# Patient Record
Sex: Female | Born: 1970 | Race: Black or African American | Hispanic: No | Smoking: Never smoker
Health system: Southern US, Community
[De-identification: ages and names within clinical notes are randomized; demographics above are authoritative.]

## PROBLEM LIST (undated history)

## (undated) DIAGNOSIS — Z21 Asymptomatic human immunodeficiency virus [HIV] infection status: Secondary | ICD-10-CM

## (undated) DIAGNOSIS — I10 Essential (primary) hypertension: Secondary | ICD-10-CM

## (undated) DIAGNOSIS — D649 Anemia, unspecified: Secondary | ICD-10-CM

## (undated) DIAGNOSIS — E785 Hyperlipidemia, unspecified: Secondary | ICD-10-CM

## (undated) DIAGNOSIS — G629 Polyneuropathy, unspecified: Secondary | ICD-10-CM

## (undated) DIAGNOSIS — B20 Human immunodeficiency virus [HIV] disease: Secondary | ICD-10-CM

## (undated) HISTORY — PX: HERNIA REPAIR: SHX51

## (undated) HISTORY — DX: Human immunodeficiency virus (HIV) disease: B20

## (undated) HISTORY — DX: Anemia, unspecified: D64.9

## (undated) HISTORY — DX: Polyneuropathy, unspecified: G62.9

## (undated) HISTORY — DX: Hyperlipidemia, unspecified: E78.5

## (undated) HISTORY — DX: Asymptomatic human immunodeficiency virus (hiv) infection status: Z21

---

## 1998-04-05 ENCOUNTER — Emergency Department (HOSPITAL_COMMUNITY): Admission: EM | Admit: 1998-04-05 | Discharge: 1998-04-05 | Payer: Self-pay | Admitting: Emergency Medicine

## 1998-04-22 ENCOUNTER — Emergency Department (HOSPITAL_COMMUNITY): Admission: EM | Admit: 1998-04-22 | Discharge: 1998-04-22 | Payer: Self-pay | Admitting: Emergency Medicine

## 1998-05-17 ENCOUNTER — Ambulatory Visit (HOSPITAL_COMMUNITY): Admission: RE | Admit: 1998-05-17 | Discharge: 1998-05-17 | Payer: Self-pay | Admitting: *Deleted

## 1998-05-17 ENCOUNTER — Other Ambulatory Visit: Admission: RE | Admit: 1998-05-17 | Discharge: 1998-05-17 | Payer: Self-pay | Admitting: *Deleted

## 1998-06-21 ENCOUNTER — Inpatient Hospital Stay (HOSPITAL_COMMUNITY): Admission: AD | Admit: 1998-06-21 | Discharge: 1998-06-21 | Payer: Self-pay | Admitting: *Deleted

## 1998-07-07 ENCOUNTER — Ambulatory Visit (HOSPITAL_COMMUNITY): Admission: RE | Admit: 1998-07-07 | Discharge: 1998-07-07 | Payer: Self-pay | Admitting: *Deleted

## 1998-08-27 ENCOUNTER — Emergency Department (HOSPITAL_COMMUNITY): Admission: EM | Admit: 1998-08-27 | Discharge: 1998-08-27 | Payer: Self-pay | Admitting: Emergency Medicine

## 1998-08-27 ENCOUNTER — Encounter: Payer: Self-pay | Admitting: Emergency Medicine

## 1998-08-27 ENCOUNTER — Inpatient Hospital Stay (HOSPITAL_COMMUNITY): Admission: AD | Admit: 1998-08-27 | Discharge: 1998-09-07 | Payer: Self-pay | Admitting: Obstetrics

## 1998-09-01 ENCOUNTER — Encounter: Payer: Self-pay | Admitting: Pulmonary Disease

## 1998-09-03 ENCOUNTER — Encounter: Payer: Self-pay | Admitting: Pulmonary Disease

## 1998-09-04 ENCOUNTER — Encounter: Payer: Self-pay | Admitting: Pulmonary Disease

## 1998-09-05 ENCOUNTER — Encounter: Payer: Self-pay | Admitting: Pulmonary Disease

## 1998-10-28 ENCOUNTER — Inpatient Hospital Stay (HOSPITAL_COMMUNITY): Admission: AD | Admit: 1998-10-28 | Discharge: 1998-10-30 | Payer: Self-pay | Admitting: *Deleted

## 1999-02-25 ENCOUNTER — Emergency Department (HOSPITAL_COMMUNITY): Admission: EM | Admit: 1999-02-25 | Discharge: 1999-02-25 | Payer: Self-pay | Admitting: Emergency Medicine

## 1999-02-26 ENCOUNTER — Encounter: Payer: Self-pay | Admitting: Emergency Medicine

## 1999-04-06 ENCOUNTER — Emergency Department (HOSPITAL_COMMUNITY): Admission: EM | Admit: 1999-04-06 | Discharge: 1999-04-06 | Payer: Self-pay | Admitting: Emergency Medicine

## 2000-08-23 ENCOUNTER — Ambulatory Visit (HOSPITAL_COMMUNITY): Admission: RE | Admit: 2000-08-23 | Discharge: 2000-08-23 | Payer: Self-pay | Admitting: *Deleted

## 2000-08-23 ENCOUNTER — Encounter: Payer: Self-pay | Admitting: *Deleted

## 2001-02-04 ENCOUNTER — Encounter (INDEPENDENT_AMBULATORY_CARE_PROVIDER_SITE_OTHER): Payer: Self-pay

## 2001-02-04 ENCOUNTER — Inpatient Hospital Stay (HOSPITAL_COMMUNITY): Admission: AD | Admit: 2001-02-04 | Discharge: 2001-02-07 | Payer: Self-pay | Admitting: *Deleted

## 2001-05-08 ENCOUNTER — Emergency Department (HOSPITAL_COMMUNITY): Admission: EM | Admit: 2001-05-08 | Discharge: 2001-05-08 | Payer: Self-pay | Admitting: *Deleted

## 2001-05-08 ENCOUNTER — Encounter: Payer: Self-pay | Admitting: Emergency Medicine

## 2002-04-16 ENCOUNTER — Other Ambulatory Visit: Admission: RE | Admit: 2002-04-16 | Discharge: 2002-04-16 | Payer: Self-pay | Admitting: Obstetrics and Gynecology

## 2002-09-16 ENCOUNTER — Encounter: Admission: RE | Admit: 2002-09-16 | Discharge: 2002-09-16 | Payer: Self-pay | Admitting: Sports Medicine

## 2002-09-16 ENCOUNTER — Encounter: Payer: Self-pay | Admitting: Sports Medicine

## 2002-09-28 ENCOUNTER — Emergency Department (HOSPITAL_COMMUNITY): Admission: EM | Admit: 2002-09-28 | Discharge: 2002-09-28 | Payer: Self-pay | Admitting: Emergency Medicine

## 2003-09-05 ENCOUNTER — Emergency Department (HOSPITAL_COMMUNITY): Admission: EM | Admit: 2003-09-05 | Discharge: 2003-09-05 | Payer: Self-pay | Admitting: Emergency Medicine

## 2003-12-16 ENCOUNTER — Emergency Department (HOSPITAL_COMMUNITY): Admission: EM | Admit: 2003-12-16 | Discharge: 2003-12-17 | Payer: Self-pay | Admitting: Emergency Medicine

## 2004-10-18 ENCOUNTER — Emergency Department (HOSPITAL_COMMUNITY): Admission: EM | Admit: 2004-10-18 | Discharge: 2004-10-18 | Payer: Self-pay | Admitting: Emergency Medicine

## 2006-02-27 ENCOUNTER — Other Ambulatory Visit: Admission: RE | Admit: 2006-02-27 | Discharge: 2006-02-27 | Payer: Self-pay | Admitting: Obstetrics and Gynecology

## 2006-06-07 ENCOUNTER — Emergency Department (HOSPITAL_COMMUNITY): Admission: EM | Admit: 2006-06-07 | Discharge: 2006-06-07 | Payer: Self-pay | Admitting: Family Medicine

## 2008-02-16 ENCOUNTER — Emergency Department (HOSPITAL_COMMUNITY): Admission: EM | Admit: 2008-02-16 | Discharge: 2008-02-16 | Payer: Self-pay | Admitting: Emergency Medicine

## 2008-09-28 ENCOUNTER — Emergency Department (HOSPITAL_COMMUNITY): Admission: EM | Admit: 2008-09-28 | Discharge: 2008-09-28 | Payer: Self-pay | Admitting: Emergency Medicine

## 2009-03-29 ENCOUNTER — Emergency Department (HOSPITAL_COMMUNITY): Admission: EM | Admit: 2009-03-29 | Discharge: 2009-03-29 | Payer: Self-pay | Admitting: Emergency Medicine

## 2009-04-05 ENCOUNTER — Emergency Department (HOSPITAL_COMMUNITY): Admission: EM | Admit: 2009-04-05 | Discharge: 2009-04-05 | Payer: Self-pay | Admitting: Emergency Medicine

## 2009-06-27 ENCOUNTER — Emergency Department (HOSPITAL_COMMUNITY): Admission: EM | Admit: 2009-06-27 | Discharge: 2009-06-27 | Payer: Self-pay | Admitting: Emergency Medicine

## 2010-04-30 ENCOUNTER — Emergency Department (HOSPITAL_COMMUNITY): Admission: EM | Admit: 2010-04-30 | Discharge: 2010-04-30 | Payer: Self-pay | Admitting: Emergency Medicine

## 2011-01-30 LAB — WET PREP, GENITAL
Trich, Wet Prep: NONE SEEN
Yeast Wet Prep HPF POC: NONE SEEN

## 2011-03-10 NOTE — H&P (Signed)
[facility]  Patient:    Krystal Bowen, Krystal Bowen                       MRN: [id number] Adm. Date:  [id number] Disc. Date: [id number] Attending:  Deniece Ree                         History and Physical  HISTORY:                      Patient is a 40 year old gravida 5 para 4 abortus 1 who was admitted and had a spontaneous vaginal delivery without any difficulty.  Patient was scheduled for a bilateral tubal ligation.  Patient understands that there are different types of contraceptives available, however, is very adamant about having permanent sterilization.  She understands that this procedure is intended to be permanent; however, cannot be guaranteed.  PAST MEDICAL HISTORY, FAMILY HISTORY, REVIEW OF SYSTEMS:   Noncontributory.  PHYSICAL EXAMINATION:  GENERAL:                      Revealed a well-developed, well-nourished female in no acute distress.  HEENT:                        Within normal limits.  NECK:                         Supple.  BREASTS:                      Without masses, tenderness, or discharge.  LUNGS:                        Clear to percussion and auscultation.  HEART:                        Normal sinus rhythm without murmur, rub, or gallop.  ABDOMEN:                      Benign with uterine fundus extending to the umbilicus.  EXTREMITIES AND NEUROLOGIC:   Within normal limits.  PELVIC:                       Not done.  DIAGNOSIS:                    Multiparity, desirous of permanent sterilization.  PLAN:                         Bilateral tubal ligation using a modified Pomeroy procedure. DD:  02/06/01 TD:  02/07/01 Job: [id number]

## 2011-03-10 NOTE — Op Note (Signed)
[facility]  Patient:    Krystal Bowen, Krystal Bowen                       MRN: [id number] Proc. Date: 02/06/01 Adm. Date:  [id number] Disc. Date: [id number] Attending:  Deniece Ree                           Operative Report  PREOPERATIVE DIAGNOSES:       Multiparity, desires permanent sterilization.  POSTOPERATIVE DIAGNOSES:      Multiparity, desires permanent sterilization  OPERATION:                    Bilateral tubal ligation using modified Pomeroy procedure.  SURGEON:                      Deniece Ree, M.D.  ANESTHESIA:                   General.  ESTIMATED BLOOD LOSS:         Less than 25 cc.  COMPLICATIONS:                None.                                Patient tolerated the procedure well and returned to the recovery room in satisfactory condition.  PROCEDURE:                    Patient was taken to the operating room and prepped and draped in the usual fashion for postpartum sterilization.  A subumbilical incision was made.  This was carried down through the fascia and peritoneum.  Entering the abdominal cavity with some manipulation, the right tube was identified, grasped with the Babcock clamp and knuckled up, and ligated using 0 plain catgut utilizing the modified Pomeroy procedure.  This was done likewise on the opposite side.  At this point hemostasis was present. Both segments of tube were labeled and sent to pathology.  The abdominal peritoneum was then closed using 2-0 Chromic in a running stitch followed by closure of the fascia using a 4-0 Monocryl in a subcuticular stitch.  At this point the procedure was terminated.  Sponge and needle count was correct x 2. Patient tolerated the procedure well and returned to the recovery room in satisfactory condition. DD:  02/06/01 TD:  02/07/01 Job: [id number]

## 2014-04-22 ENCOUNTER — Emergency Department (HOSPITAL_COMMUNITY)
Admission: EM | Admit: 2014-04-22 | Discharge: 2014-04-22 | Disposition: A | Discharge status: Home / Self Care | Payer: Self-pay | Attending: Emergency Medicine | Admitting: Emergency Medicine

## 2014-04-22 ENCOUNTER — Encounter (HOSPITAL_COMMUNITY): Payer: Self-pay | Admitting: Emergency Medicine

## 2014-04-22 DIAGNOSIS — Z8619 Personal history of other infectious and parasitic diseases: Secondary | ICD-10-CM | POA: Insufficient documentation

## 2014-04-22 DIAGNOSIS — I10 Essential (primary) hypertension: Secondary | ICD-10-CM | POA: Insufficient documentation

## 2014-04-22 DIAGNOSIS — L240 Irritant contact dermatitis due to detergents: Secondary | ICD-10-CM | POA: Insufficient documentation

## 2014-04-22 DIAGNOSIS — L259 Unspecified contact dermatitis, unspecified cause: Secondary | ICD-10-CM

## 2014-04-22 HISTORY — DX: Essential (primary) hypertension: I10

## 2014-04-22 MED ORDER — PREDNISONE 20 MG PO TABS: 40.0000 mg | ORAL_TABLET | Freq: Every day | ORAL | Status: DC

## 2014-04-22 NOTE — ED Provider Notes (Signed)
CSN: [id number]     Arrival date & time 04/22/14  1548 History  This chart was scribed for non-physician practitioner, Roxy Horsemanobert Vernis Cabacungan, PA-C, working with Toy BakerAnthony T Allen, MD by Shari HeritageAisha Amuda, ED Scribe. This patient was seen in room WTR7/WTR7 and the patient's care was started at 4:10 PM.   Chief Complaint  Patient presents with  . Rash    The history is provided by the patient. No language interpreter was used.    HPI Comments: Krystal CascoCarol D Lyssy is a 43 y.o. female who presents to the Emergency Department complaining of a red, raised rash to the upper chest. She states that the rash started out with redness two days ago, then she developed bumps to her chest today. She reports mild pruritis with rash. She states that the rash is not painful. 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 She denies recent exposure to ticks or spending time in the woods recently. She has not taken any medications for symptom relief. Patient states that she has had a "summer cold" for the past few days. There is no dysphagia, sore throat, shortness of breath, nausea or vomiting. She reports that she has no history of DM or other medical conditions.    Past Medical History  Diagnosis Date  . Hypertension    History reviewed. No pertinent past surgical history. No family history on file. History  Substance Use Topics  . Smoking status: Never Smoker   . Smokeless tobacco: Not on file  . Alcohol Use: Yes     Comment: social   OB History   Grav Para Term Preterm Abortions TAB SAB Ect Mult Living                 Review of Systems  HENT: Negative for sore throat and trouble swallowing.   Respiratory: Negative for shortness of breath.   Gastrointestinal: Negative for nausea and vomiting.  Skin: Positive for rash.    Allergies  Review of patient's allergies indicates no known  allergies.  Home Medications   Prior to Admission medications   Not on File   Triage Vitals: BP 187/104  Pulse 108  Temp(Src) 99.2 F (37.3 C) (Oral)  Resp 16  SpO2 100%  LMP 04/01/2014  Physical Exam  Nursing note and vitals reviewed. Constitutional: She is oriented to person, place, and time. She appears well-developed and well-nourished. No distress.  HENT:  Head: Normocephalic and atraumatic.  Eyes: Conjunctivae and EOM are normal.  Neck: Neck supple. No tracheal deviation present.  Cardiovascular: Normal rate, regular rhythm and normal heart sounds.   No murmur heard. Pulmonary/Chest: Effort normal and breath sounds normal. No respiratory distress. She has no wheezes. She has no rhonchi. She has no rales.  Lungs clear to auscultation.  Musculoskeletal: Normal range of motion.  Neurological: She is alert and oriented to person, place, and time.  Skin: Skin is warm and dry. Rash noted. Rash is maculopapular.  Diffuse mild maculopapular rash of the anterior chest wall. No involvement of the extremities. No obvious bug bites. No evidence of cellulitis or infectino.  Psychiatric: She has a normal mood and affect. Her behavior is normal.    ED Course  Procedures (including critical care time) DIAGNOSTIC STUDIES: Oxygen Saturation is 100% on room air, normal by my interpretation.    COORDINATION OF CARE: 4:14 PM- Patient informed of  current plan for treatment and evaluation and agrees with plan at this time.    MDM   Final diagnoses:  Contact dermatitis   Patient with possible contact dermatitis or possible viral exanthem.  Reports new laundry detergent use.  Will treat with short course of prednisone and benadryl.      I personally performed the services described in this documentation, which was scribed in my presence. The recorded information has been reviewed and is accurate.    Roxy Horsemanobert Nahal Wanless, PA-C 04/22/14 1627

## 2014-04-22 NOTE — Discharge Instructions (Signed)

## 2014-04-22 NOTE — Progress Notes (Signed)
[facility] did not get to see patient but will be sending information about [facility] Card program to help patient establish primary care, using the address provided.  °

## 2014-04-22 NOTE — ED Notes (Addendum)
Pt A+ox4, presents with c/o rash to upper chest x2 days.  Pt reports using a new laundry detergent.  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  Pt with diffuse red, raised rash to upper chest, denies rash elsewhere.  +itching.  Pt denies pain.  Skin otherwise PWD.  Speaking full/clear sentences.  NAD.

## 2014-04-23 NOTE — ED Provider Notes (Signed)
Medical screening examination/treatment/procedure(s) were performed by non-physician practitioner and as supervising physician I was immediately available for consultation/collaboration.  Hydie Langan T Shahrukh Pasch, MD 04/23/14 1640 

## 2014-06-25 ENCOUNTER — Emergency Department (HOSPITAL_COMMUNITY)
Admission: EM | Admit: 2014-06-25 | Discharge: 2014-06-25 | Disposition: A | Discharge status: Home / Self Care | Payer: Self-pay | Attending: Emergency Medicine | Admitting: Emergency Medicine

## 2014-06-25 ENCOUNTER — Encounter (HOSPITAL_COMMUNITY): Payer: Self-pay | Admitting: Emergency Medicine

## 2014-06-25 DIAGNOSIS — M545 Low back pain, unspecified: Secondary | ICD-10-CM | POA: Insufficient documentation

## 2014-06-25 DIAGNOSIS — Z79899 Other long term (current) drug therapy: Secondary | ICD-10-CM | POA: Insufficient documentation

## 2014-06-25 DIAGNOSIS — I10 Essential (primary) hypertension: Secondary | ICD-10-CM | POA: Insufficient documentation

## 2014-06-25 DIAGNOSIS — R21 Rash and other nonspecific skin eruption: Secondary | ICD-10-CM | POA: Insufficient documentation

## 2014-06-25 DIAGNOSIS — N390 Urinary tract infection, site not specified: Secondary | ICD-10-CM | POA: Insufficient documentation

## 2014-06-25 DIAGNOSIS — Z3202 Encounter for pregnancy test, result negative: Secondary | ICD-10-CM | POA: Insufficient documentation

## 2014-06-25 DIAGNOSIS — Z791 Long term (current) use of non-steroidal anti-inflammatories (NSAID): Secondary | ICD-10-CM | POA: Insufficient documentation

## 2014-06-25 DIAGNOSIS — B029 Zoster without complications: Secondary | ICD-10-CM | POA: Insufficient documentation

## 2014-06-25 LAB — URINALYSIS, ROUTINE W REFLEX MICROSCOPIC
Bilirubin Urine: NEGATIVE
GLUCOSE, UA: NEGATIVE mg/dL
Hgb urine dipstick: NEGATIVE
KETONES UR: NEGATIVE mg/dL
NITRITE: POSITIVE — AB
PROTEIN: 100 mg/dL — AB
Specific Gravity, Urine: 1.034 — ABNORMAL HIGH (ref 1.005–1.030)
UROBILINOGEN UA: 0.2 mg/dL (ref 0.0–1.0)
pH: 6 (ref 5.0–8.0)

## 2014-06-25 LAB — URINE MICROSCOPIC-ADD ON

## 2014-06-25 LAB — POC URINE PREG, ED: PREG TEST UR: NEGATIVE

## 2014-06-25 MED ORDER — HYDROCODONE-ACETAMINOPHEN 5-325 MG PO TABS: 2.0000 | ORAL_TABLET | ORAL | Status: DC | PRN

## 2014-06-25 MED ORDER — CIPROFLOXACIN HCL 500 MG PO TABS: 500.0000 mg | ORAL_TABLET | Freq: Two times a day (BID) | ORAL | Status: DC

## 2014-06-25 MED ORDER — HYDROCODONE-ACETAMINOPHEN 5-325 MG PO TABS
2.0000 | ORAL_TABLET | Freq: Once | ORAL | Status: AC
  Administered 2014-06-25: 2 via ORAL
  Filled 2014-06-25: qty 2

## 2014-06-25 MED ORDER — NAPROXEN 500 MG PO TABS: 500.0000 mg | ORAL_TABLET | Freq: Two times a day (BID) | ORAL | Status: DC

## 2014-06-25 MED ORDER — HYDROCHLOROTHIAZIDE 25 MG PO TABS
25.0000 mg | ORAL_TABLET | Freq: Once | ORAL | Status: AC
  Administered 2014-06-25: 25 mg via ORAL
  Filled 2014-06-25: qty 1

## 2014-06-25 MED ORDER — HYDROCHLOROTHIAZIDE 25 MG PO TABS: 25.0000 mg | ORAL_TABLET | Freq: Every day | ORAL | Status: DC

## 2014-06-25 MED ORDER — VALACYCLOVIR HCL 1 G PO TABS: 1000.0000 mg | ORAL_TABLET | Freq: Three times a day (TID) | ORAL | Status: DC

## 2014-06-25 NOTE — Discharge Instructions (Signed)
Please call your doctor for a followup appointment within 24-48 hours. When you talk to your doctor please let them know that you were seen in the emergency department and have them acquire all of your records so that they can discuss the findings with you and formulate a treatment plan to fully care for your new and ongoing problems. ° °

## 2014-06-25 NOTE — Progress Notes (Signed)
  CARE MANAGEMENT ED NOTE 06/25/2014  Patient:  Krystal Bowen, Krystal Bowen   Account Number:  [id number]  Date Initiated:  06/25/2014  Documentation initiated by:  Radford Pax  Subjective/Objective Assessment:   Patient presents to Ed with right lower back pain     Subjective/Objective Assessment Detail:     Action/Plan:   Action/Plan Detail:   Anticipated DC Date:       Status Recommendation to Physician:   Result of Recommendation:    Other ED Services  Consult Working Plan    DC Planning Services  Other  PCP issues    Choice offered to / List presented to:            Status of service:  Completed, signed off  ED Comments:   ED Comments Detail:  EDCM spoke to patient at bedside.  Patient confirms her pcp is Dr. Kathe Mariner.  System updated.

## 2014-06-25 NOTE — ED Notes (Signed)
Pt sts that she has some lower R back pain. Pt has hx of UTI. Pt also has been exposed to wring worm that has affect her R lower side. Pt unsure if pain increases with movement. Denies any urinary difficulty or pain at this time.

## 2014-06-25 NOTE — ED Provider Notes (Signed)
CSN: [id number]     Arrival date & time 06/25/14  2124 History   First MD Initiated Contact with Patient 06/25/14 2225     Chief Complaint  Patient presents with  . Back Pain     (Consider location/radiation/quality/duration/timing/severity/associated sxs/prior Treatment) HPI Comments: The patient is a 43 year old female who presents with right back and side pain. States that this pain is associated with a rash which is located in the right lower back and spread from her spine around the right side to the right groin. This is slightly itchy, she has been associated with, denies any pain with urination fever chills nausea or vomiting. Nothing makes this better or worse, symptoms are gradually worsening.  Patient is a 43 y.o. female presenting with back pain. The history is provided by the patient.  Back Pain   Past Medical History  Diagnosis Date  . Hypertension    History reviewed. No pertinent past surgical history. History reviewed. No pertinent family history. History  Substance Use Topics  . Smoking status: Never Smoker   . Smokeless tobacco: Not on file  . Alcohol Use: Yes     Comment: social   OB History   Grav Para Term Preterm Abortions TAB SAB Ect Mult Living                 Review of Systems  Musculoskeletal: Positive for back pain.  All other systems reviewed and are negative.     Allergies  Review of patient's allergies indicates no known allergies.  Home Medications   Prior to Admission medications   Medication Sig Start Date End Date Taking? Authorizing Provider  ibuprofen (ADVIL,MOTRIN) 200 MG tablet Take 400 mg by mouth every 6 (six) hours as needed for moderate pain.   Yes Historical Provider, MD  OVER THE COUNTER MEDICATION Take 5 mLs by mouth daily.   Yes Historical Provider, MD  ciprofloxacin (CIPRO) 500 MG tablet Take 1 tablet (500 mg total) by mouth every 12 (twelve) hours. 06/25/14   Vida Roller, MD  hydrochlorothiazide (HYDRODIURIL) 25 MG  tablet Take 1 tablet (25 mg total) by mouth daily. 06/25/14   Vida Roller, MD  HYDROcodone-acetaminophen (NORCO/VICODIN) 5-325 MG per tablet Take 2 tablets by mouth every 4 (four) hours as needed. 06/25/14   Vida Roller, MD  naproxen (NAPROSYN) 500 MG tablet Take 1 tablet (500 mg total) by mouth 2 (two) times daily with a meal. 06/25/14   Vida Roller, MD  valACYclovir (VALTREX) 1000 MG tablet Take 1 tablet (1,000 mg total) by mouth 3 (three) times daily. 06/25/14   Vida Roller, MD   BP 198/134  Pulse 102  Temp(Src) 98.3 F (36.8 C) (Oral)  Resp 22  SpO2 100% Physical Exam  Nursing note and vitals reviewed. Constitutional: She appears well-developed and well-nourished. No distress.  HENT:  Head: Normocephalic and atraumatic.  Mouth/Throat: Oropharynx is clear and moist. No oropharyngeal exudate.  Eyes: Conjunctivae and EOM are normal. Pupils are equal, round, and reactive to light. Right eye exhibits no discharge. Left eye exhibits no discharge. No scleral icterus.  Neck: Normal range of motion. Neck supple. No JVD present. No thyromegaly present.  Cardiovascular: Normal rate, regular rhythm, normal heart sounds and intact distal pulses.  Exam reveals no gallop and no friction rub.   No murmur heard. Pulmonary/Chest: Effort normal and breath sounds normal. No respiratory distress. She has no wheezes. She has no rales.  Abdominal: Soft. Bowel sounds are normal. She exhibits  no distension and no mass. There is no tenderness.  No abdominal or CVA tenderness  Musculoskeletal: Normal range of motion. She exhibits no edema and no tenderness.  Lymphadenopathy:    She has no cervical adenopathy.  Neurological: She is alert. Coordination normal.  Skin: Skin is warm and dry. Rash noted.  Rash present from the right lower back and dermatomal distribution around the right side to the right groin. It does not cross the midline, there is blistering in several areas that are slightly crusted   Psychiatric: She has a normal mood and affect. Her behavior is normal.    ED Course  Procedures (including critical care time) Labs Review Labs Reviewed  URINALYSIS, ROUTINE W REFLEX MICROSCOPIC - Abnormal; Notable for the following:    APPearance CLOUDY (*)    Specific Gravity, Urine 1.034 (*)    Protein, ur 100 (*)    Nitrite POSITIVE (*)    Leukocytes, UA TRACE (*)    All other components within normal limits  URINE MICROSCOPIC-ADD ON - Abnormal; Notable for the following:    Squamous Epithelial / LPF FEW (*)    Bacteria, UA MANY (*)    All other components within normal limits  POC URINE PREG, ED    Imaging Review No results found.    MDM   Final diagnoses:  Shingles  UTI (lower urinary tract infection)  Essential hypertension    Urinalysis consistent with infection, rash consistent with a shingles #8, medications given as below, patient stable for discharge  The patient has been persistently hypertensive, last check was 170/120, she now states that she has a history of hypertension but has not been taking medication, she requests a prescription, medications given here prior to discharge and a prescription to take home. She has no chest pain, no headache, no focal neurologic deficits and appears stable for discharge. She agrees to fill the medication as she gets home.  Filed Vitals:   06/25/14 2148  BP: 198/134  Pulse: 102  Temp: 98.3 F (36.8 C)  TempSrc: Oral  Resp: 22  SpO2: 100%     Meds given in ED:  Medications  HYDROcodone-acetaminophen (NORCO/VICODIN) 5-325 MG per tablet 2 tablet (not administered)  hydrochlorothiazide (HYDRODIURIL) tablet 25 mg (not administered)    New Prescriptions   CIPROFLOXACIN (CIPRO) 500 MG TABLET    Take 1 tablet (500 mg total) by mouth every 12 (twelve) hours.   HYDROCHLOROTHIAZIDE (HYDRODIURIL) 25 MG TABLET    Take 1 tablet (25 mg total) by mouth daily.   HYDROCODONE-ACETAMINOPHEN (NORCO/VICODIN) 5-325 MG PER TABLET     Take 2 tablets by mouth every 4 (four) hours as needed.   NAPROXEN (NAPROSYN) 500 MG TABLET    Take 1 tablet (500 mg total) by mouth 2 (two) times daily with a meal.   VALACYCLOVIR (VALTREX) 1000 MG TABLET    Take 1 tablet (1,000 mg total) by mouth 3 (three) times daily.        Vida Roller, MD 06/25/14 [phone]

## 2017-01-25 ENCOUNTER — Other Ambulatory Visit: Payer: Self-pay | Admitting: Family Medicine

## 2017-01-25 DIAGNOSIS — M79671 Pain in right foot: Secondary | ICD-10-CM

## 2017-01-25 DIAGNOSIS — M79672 Pain in left foot: Principal | ICD-10-CM

## 2017-01-30 ENCOUNTER — Other Ambulatory Visit: Payer: BLUE CROSS/BLUE SHIELD

## 2017-01-30 ENCOUNTER — Other Ambulatory Visit: Payer: Self-pay

## 2017-01-30 DIAGNOSIS — Z113 Encounter for screening for infections with a predominantly sexual mode of transmission: Secondary | ICD-10-CM

## 2017-01-30 DIAGNOSIS — B20 Human immunodeficiency virus [HIV] disease: Secondary | ICD-10-CM

## 2017-01-30 LAB — CBC WITH DIFFERENTIAL/PLATELET
BASOS ABS: 0 {cells}/uL (ref 0–200)
Basophils Relative: 0 %
Eosinophils Absolute: 111 cells/uL (ref 15–500)
Eosinophils Relative: 3 %
HEMATOCRIT: 29.5 % — AB (ref 35.0–45.0)
Hemoglobin: 9.6 g/dL — ABNORMAL LOW (ref 11.7–15.5)
LYMPHS PCT: 23 %
Lymphs Abs: 851 cells/uL (ref 850–3900)
MCH: 30.6 pg (ref 27.0–33.0)
MCHC: 32.5 g/dL (ref 32.0–36.0)
MCV: 93.9 fL (ref 80.0–100.0)
MONO ABS: 629 {cells}/uL (ref 200–950)
MPV: 11.8 fL (ref 7.5–12.5)
Monocytes Relative: 17 %
NEUTROS PCT: 57 %
Neutro Abs: 2109 cells/uL (ref 1500–7800)
Platelets: 269 10*3/uL (ref 140–400)
RBC: 3.14 MIL/uL — AB (ref 3.80–5.10)
RDW: 16.5 % — AB (ref 11.0–15.0)
WBC: 3.7 10*3/uL — AB (ref 3.8–10.8)

## 2017-01-30 LAB — COMPLETE METABOLIC PANEL WITH GFR
ALT: 19 U/L (ref 6–29)
AST: 28 U/L (ref 10–35)
Albumin: 3.4 g/dL — ABNORMAL LOW (ref 3.6–5.1)
Alkaline Phosphatase: 49 U/L (ref 33–115)
BILIRUBIN TOTAL: 0.3 mg/dL (ref 0.2–1.2)
BUN: 25 mg/dL (ref 7–25)
CALCIUM: 8.6 mg/dL (ref 8.6–10.2)
CHLORIDE: 104 mmol/L (ref 98–110)
CO2: 26 mmol/L (ref 20–31)
Creat: 0.91 mg/dL (ref 0.50–1.10)
GFR, EST AFRICAN AMERICAN: 88 mL/min (ref >=60)
GFR, EST NON AFRICAN AMERICAN: 76 mL/min (ref >=60)
Glucose, Bld: 86 mg/dL (ref 65–99)
Potassium: 3.8 mmol/L (ref 3.5–5.3)
Sodium: 136 mmol/L (ref 135–146)
Total Protein: 8.8 g/dL — ABNORMAL HIGH (ref 6.1–8.1)

## 2017-01-30 LAB — LIPID PANEL
CHOLESTEROL: 162 mg/dL (ref <200)
HDL: 25 mg/dL — ABNORMAL LOW (ref >50)
LDL CALC: 58 mg/dL (ref <100)
Total CHOL/HDL Ratio: 6.5 Ratio — ABNORMAL HIGH (ref <5.0)
Triglycerides: 394 mg/dL — ABNORMAL HIGH (ref <150)
VLDL: 79 mg/dL — AB (ref <30)

## 2017-01-31 ENCOUNTER — Other Ambulatory Visit: Payer: Self-pay

## 2017-01-31 LAB — HEPATITIS C ANTIBODY: HCV AB: NEGATIVE

## 2017-01-31 LAB — T-HELPER CELL (CD4) - (RCID CLINIC ONLY)
CD4 T CELL HELPER: 11 % — AB (ref 33–55)
CD4 T Cell Abs: 100 /uL — ABNORMAL LOW (ref 400–2700)

## 2017-01-31 LAB — HEPATITIS B CORE ANTIBODY, TOTAL: HEP B C TOTAL AB: NONREACTIVE

## 2017-01-31 LAB — HEPATITIS A ANTIBODY, TOTAL: HEP A TOTAL AB: NONREACTIVE

## 2017-01-31 LAB — RPR

## 2017-01-31 LAB — HEPATITIS B SURFACE ANTIGEN: Hepatitis B Surface Ag: NEGATIVE

## 2017-01-31 LAB — HEPATITIS B SURFACE ANTIBODY,QUALITATIVE: HEP B S AB: POSITIVE — AB

## 2017-02-01 LAB — HIV-1 RNA,QN PCR W/REFLEX GENOTYPE
HIV-1 RNA, QN PCR: 5.62 {Log_copies}/mL — AB
HIV-1 RNA, QN PCR: 420000 Copies/mL — ABNORMAL HIGH

## 2017-02-02 ENCOUNTER — Other Ambulatory Visit: Payer: Self-pay

## 2017-02-02 LAB — QUANTIFERON TB GOLD ASSAY (BLOOD)
INTERFERON GAMMA RELEASE ASSAY: NEGATIVE
Mitogen-Nil: 0.92 IU/mL
Quantiferon Nil Value: 0.21 IU/mL
Quantiferon Tb Ag Minus Nil Value: <0 IU/mL

## 2017-02-05 LAB — HLA B*5701: HLA-B*5701 w/rflx HLA-B High: NEGATIVE

## 2017-02-08 LAB — HIV-1 GENOTYPR PLUS

## 2017-02-12 ENCOUNTER — Ambulatory Visit
Admission: RE | Admit: 2017-02-12 | Discharge: 2017-02-12 | Disposition: A | Discharge status: Home / Self Care | Payer: BLUE CROSS/BLUE SHIELD | Source: Ambulatory Visit | Attending: Family Medicine | Admitting: Family Medicine

## 2017-02-12 DIAGNOSIS — D649 Anemia, unspecified: Secondary | ICD-10-CM | POA: Insufficient documentation

## 2017-02-12 DIAGNOSIS — E785 Hyperlipidemia, unspecified: Secondary | ICD-10-CM | POA: Insufficient documentation

## 2017-02-12 DIAGNOSIS — M79671 Pain in right foot: Secondary | ICD-10-CM

## 2017-02-12 DIAGNOSIS — M79672 Pain in left foot: Principal | ICD-10-CM

## 2017-02-12 DIAGNOSIS — Z8619 Personal history of other infectious and parasitic diseases: Secondary | ICD-10-CM | POA: Insufficient documentation

## 2017-02-12 DIAGNOSIS — B20 Human immunodeficiency virus [HIV] disease: Secondary | ICD-10-CM | POA: Insufficient documentation

## 2017-02-12 DIAGNOSIS — I10 Essential (primary) hypertension: Secondary | ICD-10-CM | POA: Insufficient documentation

## 2017-02-13 ENCOUNTER — Ambulatory Visit: Payer: Self-pay | Admitting: Internal Medicine

## 2017-02-13 ENCOUNTER — Ambulatory Visit (INDEPENDENT_AMBULATORY_CARE_PROVIDER_SITE_OTHER): Payer: BLUE CROSS/BLUE SHIELD | Admitting: Internal Medicine

## 2017-02-13 ENCOUNTER — Encounter: Payer: Self-pay | Admitting: Internal Medicine

## 2017-02-13 DIAGNOSIS — B20 Human immunodeficiency virus [HIV] disease: Secondary | ICD-10-CM | POA: Diagnosis not present

## 2017-02-13 DIAGNOSIS — E785 Hyperlipidemia, unspecified: Secondary | ICD-10-CM | POA: Diagnosis not present

## 2017-02-13 DIAGNOSIS — Z8619 Personal history of other infectious and parasitic diseases: Secondary | ICD-10-CM

## 2017-02-13 DIAGNOSIS — D649 Anemia, unspecified: Secondary | ICD-10-CM | POA: Diagnosis not present

## 2017-02-13 DIAGNOSIS — I10 Essential (primary) hypertension: Secondary | ICD-10-CM

## 2017-02-13 DIAGNOSIS — G629 Polyneuropathy, unspecified: Secondary | ICD-10-CM | POA: Insufficient documentation

## 2017-02-13 DIAGNOSIS — G609 Hereditary and idiopathic neuropathy, unspecified: Secondary | ICD-10-CM

## 2017-02-13 MED ORDER — DOLUTEGRAVIR SODIUM 50 MG PO TABS: 50.0000 mg | ORAL_TABLET | Freq: Every day | ORAL | 3 refills | Status: DC

## 2017-02-13 MED ORDER — SULFAMETHOXAZOLE-TRIMETHOPRIM 800-160 MG PO TABS: 1.0000 | ORAL_TABLET | ORAL | 1 refills | Status: DC

## 2017-02-13 MED ORDER — EMTRICITABINE-TENOFOVIR AF 200-25 MG PO TABS: 1.0000 | ORAL_TABLET | Freq: Every day | ORAL | 3 refills | Status: DC

## 2017-02-13 NOTE — Assessment & Plan Note (Signed)
BP is within goal today on current regimen of HCTZ. Reports excellent compliance with medication. Will be managed by PCP.

## 2017-02-13 NOTE — Assessment & Plan Note (Signed)
Hemoglobin 9.6 today. Does complain of fatigue but she relates this more to starting on gabapentin. Likely related to uncontrolled HIV. Continue to monitor.

## 2017-02-13 NOTE — Progress Notes (Signed)
HPI: Krystal Bowen is a 46 y.o. female with HIV infection who has been out of care, off antiretrovirals for several years.  Allergies: No Known Allergies  Vitals: Temp: 98.3 F (36.8 C) (04/24 0855) Temp Source: Oral (04/24 0855) BP: 132/86 (04/24 0855) Pulse Rate: 60 (04/24 0855)  Past Medical History: Past Medical History:  Diagnosis Date  . Hypertension     Social History: Social History   Social History  . Marital status: Legally Separated    Spouse name: N/A  . Number of children: N/A  . Years of education: N/A   Social History Main Topics  . Smoking status: Never Smoker  . Smokeless tobacco: Never Used  . Alcohol use Yes     Comment: social  . Drug use: No  . Sexual activity: Yes    Partners: Male    Birth control/ protection: Condom     Comment: given condoms   Other Topics Concern  . None   Social History Narrative  . None    Previous Regimen: Atripla (difficulty swallowing regimen was changed) Viread, Emtriva, Isentress  Current Regimen: None  Labs: CD4 T Cell Abs (/uL)  Date Value  01/30/2017 100 (L)   Hep B S Ab (no units)  Date Value  01/30/2017 POS (A)   Hepatitis B Surface Ag (no units)  Date Value  01/30/2017 NEGATIVE   HCV Ab (no units)  Date Value  01/30/2017 NEGATIVE    CrCl: Estimated Creatinine Clearance: 58.3 mL/min (by C-G formula based on SCr of 0.91 mg/dL).  Lipids:    Component Value Date/Time   CHOL 162 01/30/2017 1517   TRIG 394 (H) 01/30/2017 1517   HDL 25 (L) 01/30/2017 1517   CHOLHDL 6.5 (H) 01/30/2017 1517   VLDL 79 (H) 01/30/2017 1517   LDLCALC 58 01/30/2017 1517    Assessment: Krystal Bowen is a 46 year old female who presents today for re-initiation of antiretroviral therapy. She has been out of care for several years and has not been on antiretroviral medications. Today, she is interested in starting ART but has extreme difficulty related to swallowing tablets. She reports as a child she had a  traumatic incident when she swallowed a nickel by accident. She states that each time she tries to swallow pills she is reminded of this and cannot get the pill down.   The only medications that she is currently taking are HCTZ (tiny pill) and gabapentin (cuts in half to be able to swallow). She denies taking any additional OTC or herbal supplements. I recommended she call the clinic or send a MyChart message prior to starting any new medications so that we can check for any potential drug interactions.  Due to her difficulty swallowing medication, we will start her on a crushable regimen of daily Tivicay and Descovy. I instructed her to crush these medications and mix them with applesauce to help with administration. She will also require PCP prophylaxis with Bactrim DS. I informed her that she can take this medication three times weekly or daily. Ms. Budreau prefers MWF administration with the option to change to daily if she is having trouble remembering specific days of the week. She is able to crush the Bactrim as well and take it with applesauce.   I have also reached out to [facility] to determine if the new agent, Biktarvy, has any data regarding the ability to crush. We discussed this as a potential choice in the future if data does become available.  Recommendations -  Start Tivicay and Descovy once daily (can crush and mix with applesauce to help with compliance and administration) - Bactrim DS, 1 tablet on Monday, Wednesday and Friday (can change to daily if she has difficulty remembering specific days of the week) - Patient encouraged to call pharmacy clinic with any medication questions or potential drug interactions  Casilda Carls, PharmD, BCPS PGY-2 Infectious Diseases Pharmacy Resident Pager: [phone] 02/13/2017, 10:02 AM

## 2017-02-13 NOTE — Assessment & Plan Note (Addendum)
Treated in 2015. No recurrent episodes since initial treatment per patient report. Continue to monitor.

## 2017-02-13 NOTE — Patient Instructions (Addendum)
Nice to meet you today.   We are starting you on two pills that you will take ONCE a day - Descovy and Tivicay.   - It is OK to crush these medications and put them in a small amount of applesauce.  - Take these at the same time each day  We are also starting you on Bactrim (antibiotic to prevent infections).   -Take ONE pill three times a week (every Monday, Wednesday and Friday).   -It is OK to crush this medication as well  -If you find it is hard to take 3 times a week can take this every day.    We will see you back for a visit and repeat labs in 6 weeks.

## 2017-02-13 NOTE — Progress Notes (Addendum)
Attest:  I have seen and examined Krystal Bowen today and discussed her care with Rexene Alberts NP and tail are stone for infectious disease pharmacist. I am in complete agreement with their assessments and plans to start her back on Descovy, Tivicay and prophylactic trimethoprim sulfamethoxazole.   Patient Active Problem List   Diagnosis Date Noted  . Peripheral neuropathy 02/13/2017  . HIV disease (HCC) 02/12/2017  . HTN (hypertension) 02/12/2017  . Normocytic anemia 02/12/2017  . Dyslipidemia 02/12/2017  . History of shingles 02/12/2017    Patient's Medications  New Prescriptions   DOLUTEGRAVIR (TIVICAY) 50 MG TABLET    Take 1 tablet (50 mg total) by mouth daily.   EMTRICITABINE-TENOFOVIR AF (DESCOVY) 200-25 MG TABLET    Take 1 tablet by mouth daily.   SULFAMETHOXAZOLE-TRIMETHOPRIM (BACTRIM DS,SEPTRA DS) 800-160 MG TABLET    Take 1 tablet by mouth 3 (three) times a week.  Previous Medications   GABAPENTIN (NEURONTIN) 600 MG TABLET    Take 300 mg by mouth 2 (two) times daily.   HYDROCHLOROTHIAZIDE (HYDRODIURIL) 25 MG TABLET    Take 25 mg by mouth.  Modified Medications   No medications on file  Discontinued Medications   CIPROFLOXACIN (CIPRO) 500 MG TABLET    Take 1 tablet (500 mg total) by mouth every 12 (twelve) hours.   EMTRICITABINE (EMTRIVA) 200 MG CAPSULE    Take 200 mg by mouth.   HYDROCHLOROTHIAZIDE (HYDRODIURIL) 25 MG TABLET    Take 1 tablet (25 mg total) by mouth daily.   HYDROCODONE-ACETAMINOPHEN (NORCO/VICODIN) 5-325 MG PER TABLET    Take 2 tablets by mouth every 4 (four) hours as needed.   IBUPROFEN (ADVIL,MOTRIN) 200 MG TABLET    Take 400 mg by mouth every 6 (six) hours as needed for moderate pain.   NAPROXEN (NAPROSYN) 500 MG TABLET    Take 1 tablet (500 mg total) by mouth 2 (two) times daily with a meal.   OVER THE COUNTER MEDICATION    Take 5 mLs by mouth daily.   RALTEGRAVIR (ISENTRESS) 400 MG TABLET    Take 400 mg by mouth.   TENOFOVIR (VIREAD) 300 MG TABLET     Take 300 mg by mouth.   VALACYCLOVIR (VALTREX) 1000 MG TABLET    Take 1 tablet (1,000 mg total) by mouth 3 (three) times daily.    Subjective: Krystal Bowen is here today for her first visit for HIV care. She reports she was diagnosed in 2011 but was not started right away on ART until 2012 when she decided it was time to start. She was in care at [facility], initially started on Atripla but was unable to tolerate d/t pill size and was switched to Viread, Emtriva, Isentress. Reports she did her best to swallow the pills but was unable to tolerate some of the meds due to "bad gag reflex." She last took ART in 2014 and reports that she at times took only a partial regimen, however cannot recall which she specifically took. Per records at [facility] last CD4 in 06/2012 was 710 and VL 2410.  She recently moved back to [facility] January 2018 from [place] and is living with her mother and father. She was not in care during her time in [place]. She is currently unemployed and moved back due to her inability to work because of foot pain/neuropathy. 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 Her mother, father  and two close friends (one of whom is local) know of her infection and she feels she has adequate support. Her 4 children (aged 31 - 24) do not know at this time.   Does not currently have dentist locally and has established care with Dr. Hoyt Koch [name]) for PCP. She is insured currently with [employer], however has required assistance through [employer] in the past.   Review of Systems: Review of Systems  Constitutional: Negative for chills, fever, malaise/fatigue and weight loss.  HENT: Negative for sore throat.        No dental complaints  Respiratory: Negative for cough, sputum production and shortness of breath.   Cardiovascular: Negative.   Gastrointestinal: Negative for abdominal pain, diarrhea and vomiting.       Sensitive  gag reflex  Musculoskeletal: Negative for myalgias and neck pain.       Bilateral foot pain   Skin: Negative for rash.  Neurological: Negative for headaches.  Psychiatric/Behavioral: Negative for depression and substance abuse. The patient is not nervous/anxious.     Past Medical History:  Diagnosis Date  . Anemia   . HIV infection (HCC)   . Hyperlipidemia   . Hypertension   . Neuropathy     Social History  Substance Use Topics  . Smoking status: Never Smoker  . Smokeless tobacco: Never Used  . Alcohol use Yes     Comment: social    Family History  Problem Relation Age of Onset  . Heart attack Mother   . Stroke Mother   . Hyperlipidemia Mother   . Cancer Father     Leukemia   . Hyperlipidemia Maternal Grandmother   . Alzheimer's disease Maternal Grandmother   . Heart attack Maternal Grandfather     No Known Allergies  Objective:  Vitals:   02/13/17 0855  BP: 132/86  Pulse: 60  Temp: 98.3 F (36.8 C)  TempSrc: Oral  Weight: 109 lb (49.4 kg)  Height:  (1.549 m)   Body mass index is 20.6 kg/m.  Physical Exam  Constitutional: She is oriented to person, place, and time and well-developed, well-nourished, and in no distress.  HENT:  Mouth/Throat: No oral lesions. No dental abscesses.  Cardiovascular: Normal rate, regular rhythm and normal heart sounds.   Pulmonary/Chest: Effort normal and breath sounds normal.  Abdominal: Soft. She exhibits no distension. There is no tenderness.  Musculoskeletal: Normal range of motion. She exhibits no tenderness.  Using standard walker today  Lymphadenopathy:    She has no cervical adenopathy.  Neurological: She is alert and oriented to person, place, and time.  Skin: Skin is warm and dry. No rash noted.  Psychiatric: Mood, affect and judgment normal.  Pleasant and in good spirits today. Motivated to resume care.   Vitals reviewed.   Lab Results Lab Results  Component Value Date   WBC 3.7 (L) 01/30/2017    HGB 9.6 (L) 01/30/2017   HCT 29.5 (L) 01/30/2017   MCV 93.9 01/30/2017   PLT 269 01/30/2017    Lab Results  Component Value Date   CREATININE 0.91 01/30/2017   BUN 25 01/30/2017   NA 136 01/30/2017   K 3.8 01/30/2017   CL 104 01/30/2017   CO2 26 01/30/2017    Lab Results  Component Value Date   ALT 19 01/30/2017   AST 28 01/30/2017   ALKPHOS 49 01/30/2017   BILITOT 0.3 01/30/2017    Lab Results  Component Value Date   CHOL 162 01/30/2017   HDL  25 (L) 01/30/2017   LDLCALC 58 01/30/2017   TRIG 394 (H) 01/30/2017   CHOLHDL 6.5 (H) 01/30/2017   CD4 T Cell Abs (/uL)  Date Value  01/30/2017 100 (L)     Problem List Items Addressed This Visit      Cardiovascular and Mediastinum   HTN (hypertension)    BP is within goal today on current regimen of HCTZ. Reports excellent compliance with medication. Will be managed by PCP.       Relevant Medications   hydrochlorothiazide (HYDRODIURIL) 25 MG tablet     Nervous and Auditory   Peripheral neuropathy    Likely related to HIV. Recently started on gabapentin per PCP and is taking 300 mg in AM and in PM. Reports feeling tired with the medication. Still on a relatively low dose but with fatigue will opt to continue current dose. May trial pregabalin later if continues despite resuming ART.       Relevant Medications   gabapentin (NEURONTIN) 600 MG tablet     Other   HIV disease (HCC)    Krystal Bowen has been off her medications for approaching 4 years now with low CD4 count of 100 and VL of 420k. I discussed with Sarin treatment options and side effects, benefits of treatment, adherence to therapy and long-term outcomes. She spent time talking with our pharmacist Ladona Ridgel regarding successful practices of ART and understands to reach out to our clinic in the future with questions.   We decided to start Krystal Bowen on Descovy and Tivicay today which is a regimen she can crush. She is insured has no problems taking medications. Will return to  clinic in 6 weeks with lab work and continue HIV education. With low CD4 count will start her on Bactrim 3x weekly - she may crush this as well.   We had her meet with Sherri our case manager today in clinic as well for additional support regarding acceptance of HIV and education of available resources. She deferred dental services at this time and will find a dentist locally.        Relevant Medications   dolutegravir (TIVICAY) 50 MG tablet   emtricitabine-tenofovir AF (DESCOVY) 200-25 MG tablet   sulfamethoxazole-trimethoprim (BACTRIM DS,SEPTRA DS) 800-160 MG tablet (Start on 02/14/2017)   Other Relevant Orders   HIV-1 Integrase Genotype   Normocytic anemia    Hemoglobin 9.6 today. Does complain of fatigue but she relates this more to starting on gabapentin. Likely related to uncontrolled HIV. Continue to monitor.       Dyslipidemia    Family history for hyperlipidemia and MI's. Counseled today regarding importance to care for these conditions as well for preventative care and cardiovascular risk reduction. She is currently establishing care with Dr. Donnetta Simpers here locally for management.       History of shingles    Treated in 2015. No recurrent episodes since initial treatment per patient report. Continue to monitor.          Rexene Alberts, MSN, NP-C [facility] for Infectious Disease [facility] Cell: [phone] Pager: [phone]  02/13/17 12:54 PM

## 2017-02-13 NOTE — Assessment & Plan Note (Addendum)
[name] has been off her medications for approaching 4 years now with low CD4 count of 100 and VL of 420k. I discussed with Griselda treatment options and side effects, benefits of treatment, adherence to therapy and long-term outcomes. She spent time talking with our pharmacist Ladona Ridgel regarding successful practices of ART and understands to reach out to our clinic in the future with questions.   We decided to start Krystal Bowen on Descovy and Tivicay today which is a regimen she can crush. She is insured has no problems taking medications. Will return to clinic in 6 weeks with lab work and continue HIV education. With low CD4 count will start her on Bactrim 3x weekly - she may crush this as well.   We had her meet with Sherri our case manager today in clinic as well for additional support regarding acceptance of HIV and education of available resources. She deferred dental services at this time and will find a dentist locally.

## 2017-02-13 NOTE — Assessment & Plan Note (Signed)
Family history for hyperlipidemia and MI's. Counseled today regarding importance to care for these conditions as well for preventative care and cardiovascular risk reduction. She is currently establishing care with Dr. Donnetta Simpers here locally for management.

## 2017-02-13 NOTE — Assessment & Plan Note (Signed)
Likely related to HIV. Recently started on gabapentin per PCP and is taking 300 mg in AM and in PM. Reports feeling tired with the medication. Still on a relatively low dose but with fatigue will opt to continue current dose. May trial pregabalin later if continues despite resuming ART.

## 2017-02-21 LAB — HIV-1 INTEGRASE GENOTYPE

## 2017-02-22 ENCOUNTER — Encounter: Payer: Self-pay | Admitting: *Deleted

## 2017-03-05 ENCOUNTER — Ambulatory Visit: Payer: BLUE CROSS/BLUE SHIELD | Admitting: Podiatry

## 2017-03-29 ENCOUNTER — Ambulatory Visit: Payer: BLUE CROSS/BLUE SHIELD | Admitting: Internal Medicine

## 2017-04-05 ENCOUNTER — Ambulatory Visit (INDEPENDENT_AMBULATORY_CARE_PROVIDER_SITE_OTHER): Payer: Medicaid Other | Admitting: Internal Medicine

## 2017-04-05 ENCOUNTER — Encounter: Payer: Self-pay | Admitting: Internal Medicine

## 2017-04-05 DIAGNOSIS — B20 Human immunodeficiency virus [HIV] disease: Secondary | ICD-10-CM | POA: Diagnosis not present

## 2017-04-05 NOTE — Assessment & Plan Note (Signed)
I reviewed her treatment plan again with her and also had her meet with Ulyses SouthwardMinh Pham our ID pharmacist. I will have her follow-up with pharmacy to make sure that she is using her pillbox correctly and tolerating her Descovy and Tivicay. She will follow-up with me after blood work in 6 weeks.

## 2017-04-05 NOTE — Progress Notes (Signed)
HPI: Krystal Bowen is a 46 y.o. female who is here for her HIV f/u with Dr. Orvan Falconerampbell.  Allergies: No Known Allergies  Vitals: Temp: 98.3 F (36.8 C) (06/14 1033) Temp Source: Oral (06/14 1033) BP: 128/87 (06/14 1033) Pulse Rate: 121 (06/14 1033)  Past Medical History: Past Medical History:  Diagnosis Date  . Anemia   . HIV infection (HCC)   . Hyperlipidemia   . Hypertension   . Neuropathy     Social History: Social History   Social History  . Marital status: Legally Separated    Spouse name: N/A  . Number of children: 4  . Years of education: N/A   Occupational History  . unemployed     Previously a LawyerCNA for 20 years   Social History Main Topics  . Smoking status: Never Smoker  . Smokeless tobacco: Never Used  . Alcohol use Yes     Comment: social  . Drug use: No  . Sexual activity: Not Currently    Partners: Male    Birth control/ protection: Condom     Comment: accepted condoms   Other Topics Concern  . None   Social History Narrative  . None    Previous Regimen: ATP, TDF/FTC/ISN  Current Regimen: Not started yet  Labs: CD4 T Cell Abs (/uL)  Date Value  01/30/2017 100 (L)   Hep B S Ab (no units)  Date Value  01/30/2017 POS (A)   Hepatitis B Surface Ag (no units)  Date Value  01/30/2017 NEGATIVE   HCV Ab (no units)  Date Value  01/30/2017 NEGATIVE    CrCl: CrCl cannot be calculated (Patient's most recent lab result is older than the maximum 21 days allowed.).  Lipids:    Component Value Date/Time   CHOL 162 01/30/2017 1517   TRIG 394 (H) 01/30/2017 1517   HDL 25 (L) 01/30/2017 1517   CHOLHDL 6.5 (H) 01/30/2017 1517   VLDL 79 (H) 01/30/2017 1517   LDLCALC 58 01/30/2017 1517    Assessment: Krystal Bowen saw us in April to restart her ART. She has been off of therapy for several years now. She has a hx of difficulty swallowing pills in the past. That's the reason why Tivicay and Descovy were chosen due to its crushability. She has  gotten the meds now but has not started them yet. Due to her unreliability, I'm going to bring her back next week to sort this out with her pill box. I really stressed the importance of taking her meds consistently.   Recommendations:  Start the Tivicay/Descovy today F/u with me next week  Ulyses SouthwardMinh Domanik [place], PharmD, BCPS, AAHIVP, CPP Clinical Infectious Disease Pharmacist [facility] 04/05/2017, 11:34 AM

## 2017-04-05 NOTE — Progress Notes (Signed)
Patient Active Problem List   Diagnosis Date Noted  . HIV disease (HCC) 02/12/2017    Priority: High  . Peripheral neuropathy 02/13/2017  . HTN (hypertension) 02/12/2017  . Normocytic anemia 02/12/2017  . Dyslipidemia 02/12/2017  . History of shingles 02/12/2017    Patient's Medications  New Prescriptions   No medications on file  Previous Medications   DOLUTEGRAVIR (TIVICAY) 50 MG TABLET    Take 1 tablet (50 mg total) by mouth daily.   EMTRICITABINE-TENOFOVIR AF (DESCOVY) 200-25 MG TABLET    Take 1 tablet by mouth daily.   GABAPENTIN (NEURONTIN) 600 MG TABLET    Take 300 mg by mouth 2 (two) times daily.   HYDROCHLOROTHIAZIDE (HYDRODIURIL) 25 MG TABLET    Take 25 mg by mouth.   SULFAMETHOXAZOLE-TRIMETHOPRIM (BACTRIM DS,SEPTRA DS) 800-160 MG TABLET    Take 1 tablet by mouth 3 (three) times a week.  Modified Medications   No medications on file  Discontinued Medications   No medications on file    Subjective: Krystal Bowen is in for her routine follow-up visit. At the time of her initial visit 2 months ago she was prescribed a new regimen of Descovy and Tivicay. She only picked up the medication last week and has not started taking it yet. She did open the bottles and believes that there will be no problem swallowing the small tablets. She has a pillbox but is not using it for her other medications yet. She says she did fill Bactrim but lost her bottle and doesn't know where it is.   Review of Systems: Review of Systems  Constitutional: Negative for chills, diaphoresis, fever, malaise/fatigue and weight loss.  HENT: Negative for sore throat.   Respiratory: Negative for cough, sputum production and shortness of breath.   Cardiovascular: Negative for chest pain.  Gastrointestinal: Negative for abdominal pain, diarrhea, heartburn, nausea and vomiting.  Genitourinary: Negative for dysuria and frequency.  Musculoskeletal: Negative for joint pain and myalgias.  Skin: Negative  for rash.  Neurological: Positive for sensory change. Negative for dizziness and headaches.  Psychiatric/Behavioral: Negative for depression and substance abuse. The patient is not nervous/anxious.     Past Medical History:  Diagnosis Date  . Anemia   . HIV infection (HCC)   . Hyperlipidemia   . Hypertension   . Neuropathy     Social History  Substance Use Topics  . Smoking status: Never Smoker  . Smokeless tobacco: Never Used  . Alcohol use Yes     Comment: social    Family History  Problem Relation Age of Onset  . Heart attack Mother   . Stroke Mother   . Hyperlipidemia Mother   . Cancer Father        Leukemia   . Hyperlipidemia Maternal Grandmother   . Alzheimer's disease Maternal Grandmother   . Heart attack Maternal Grandfather     No Known Allergies  Objective:  Vitals:   04/05/17 1033  BP: 128/87  Pulse: (!) 121  Temp: 98.3 F (36.8 C)  TempSrc: Oral  Weight: 105 lb (47.6 kg)  Height: 5' 1.5" (1.562 m)   Body mass index is 19.52 kg/m.  Physical Exam  Constitutional: She is oriented to person, place, and time.  She is smiling and in good spirits.  HENT:  Mouth/Throat: No oropharyngeal exudate.  Eyes: Conjunctivae are normal.  Cardiovascular: Normal rate and regular rhythm.   No murmur heard. Pulmonary/Chest: Breath sounds normal.  Abdominal: Soft.  She exhibits no mass. There is no tenderness.  Musculoskeletal: Normal range of motion.  Neurological: She is alert and oriented to person, place, and time.  She has a slow somewhat shuffling gait and walks with the assistance of her walker.  Skin: No rash noted.  Psychiatric: Mood and affect normal.    Lab Results Lab Results  Component Value Date   WBC 3.7 (L) 01/30/2017   HGB 9.6 (L) 01/30/2017   HCT 29.5 (L) 01/30/2017   MCV 93.9 01/30/2017   PLT 269 01/30/2017    Lab Results  Component Value Date   CREATININE 0.91 01/30/2017   BUN 25 01/30/2017   NA 136 01/30/2017   K 3.8  01/30/2017   CL 104 01/30/2017   CO2 26 01/30/2017    Lab Results  Component Value Date   ALT 19 01/30/2017   AST 28 01/30/2017   ALKPHOS 49 01/30/2017   BILITOT 0.3 01/30/2017    Lab Results  Component Value Date   CHOL 162 01/30/2017   HDL 25 (L) 01/30/2017   LDLCALC 58 01/30/2017   TRIG 394 (H) 01/30/2017   CHOLHDL 6.5 (H) 01/30/2017   CD4 T Cell Abs (/uL)  Date Value  01/30/2017 100 (L)     Problem List Items Addressed This Visit      High   HIV disease (HCC)    I reviewed her treatment plan again with her and also had her meet with Ulyses Southward our ID pharmacist. I will have her follow-up with pharmacy to make sure that she is using her pillbox correctly and tolerating her Descovy and Tivicay. She will follow-up with me after blood work in 6 weeks.      Relevant Orders   T-helper cell (CD4)- ([facility] clinic only)   HIV 1 RNA quant-no reflex-bld   CBC   Comprehensive metabolic panel        [name], MD [facility] [phone] pager   [phone] cell 04/05/2017, 10:56 AM

## 2017-04-12 ENCOUNTER — Ambulatory Visit: Payer: Medicaid Other | Admitting: Pharmacist Clinician (PhC)/ Clinical Pharmacy Specialist

## 2017-04-12 DIAGNOSIS — B20 Human immunodeficiency virus [HIV] disease: Secondary | ICD-10-CM

## 2017-04-12 NOTE — Progress Notes (Signed)
  [facility] - Pharmacist    HPI: Krystal CascoCarol D Scarfo is a 46 y.o. female who is here to follow up with me to sort out her meds.   Allergies: No Known Allergies  Vitals:    Past Medical History: Past Medical History:  Diagnosis Date  . Anemia   . HIV infection (HCC)   . Hyperlipidemia   . Hypertension   . Neuropathy     Social History: Social History   Social History  . Marital status: Legally Separated    Spouse name: N/A  . Number of children: 4  . Years of education: N/A   Occupational History  . unemployed     Previously a LawyerCNA for 20 years   Social History Main Topics  . Smoking status: Never Smoker  . Smokeless tobacco: Never Used  . Alcohol use Yes     Comment: social  . Drug use: No  . Sexual activity: Not Currently    Partners: Male    Birth control/ protection: Condom     Comment: accepted condoms   Other Topics Concern  . Not on file   Social History Narrative  . No narrative on file    Previous Regimen: None  Current Regimen: Tivicay and descovy  Labs: CD4 T Cell Abs (/uL)  Date Value  01/30/2017 100 (L)   Hep B S Ab (no units)  Date Value  01/30/2017 POS (A)   Hepatitis B Surface Ag (no units)  Date Value  01/30/2017 NEGATIVE   HCV Ab (no units)  Date Value  01/30/2017 NEGATIVE     CrCl: CrCl cannot be calculated (Patient's most recent lab result is older than the maximum 21 days allowed.).  Lipids:    Component Value Date/Time   CHOL 162 01/30/2017 1517   TRIG 394 (H) 01/30/2017 1517   HDL 25 (L) 01/30/2017 1517   CHOLHDL 6.5 (H) 01/30/2017 1517   VLDL 79 (H) 01/30/2017 1517   LDLCALC 58 01/30/2017 1517    Assessment: I saw Krystal Bowen a week ago to go over her meds again but she didn't have it with her. She has not started her ART yet at that time but she did have the supply at home. She started both DTG/Descovy that day. She has tolerated it very well without much side effect. She is taking  all of the medication correct and brought her meds here to the visit. However, she has been cutting the neurontin in half and put in an ibuprofen bottle. I gave her a new prescription bottle and re-labeled it with the correct med. She has been using the pill box that she bought from [facility] to keep track of her weekly medications. She will f/u with me in 1 month and I can do labs at that time before the visit with Dr. Orvan Falconerampbell.  Recommendations:  Continue DTG/Descovy Continue Septra DS MWF F/u with me in July F/u with Dr. Orvan Falconerampbell in August  Minh Pham, Vermont[place]arm.D., BCPS, AAHIVP Clinical Infectious Disease Pharmacist [facility] 04/12/2017, 11:23 AM

## 2017-05-03 ENCOUNTER — Other Ambulatory Visit: Payer: Self-pay | Admitting: Family Medicine

## 2017-05-03 ENCOUNTER — Other Ambulatory Visit (HOSPITAL_COMMUNITY)
Admission: RE | Admit: 2017-05-03 | Discharge: 2017-05-03 | Disposition: A | Discharge status: Home / Self Care | Payer: Medicaid Other | Source: Ambulatory Visit | Attending: Family Medicine | Admitting: Family Medicine

## 2017-05-03 DIAGNOSIS — Z124 Encounter for screening for malignant neoplasm of cervix: Secondary | ICD-10-CM | POA: Diagnosis present

## 2017-05-07 ENCOUNTER — Encounter: Payer: Medicaid Other | Admitting: Licensed Clinical Social Worker

## 2017-05-07 ENCOUNTER — Telehealth: Payer: Self-pay | Admitting: Licensed Clinical Social Worker

## 2017-05-07 ENCOUNTER — Telehealth: Payer: Self-pay | Admitting: *Deleted

## 2017-05-07 ENCOUNTER — Ambulatory Visit (INDEPENDENT_AMBULATORY_CARE_PROVIDER_SITE_OTHER): Payer: Medicaid Other | Admitting: Pharmacist Clinician (PhC)/ Clinical Pharmacy Specialist

## 2017-05-07 DIAGNOSIS — B20 Human immunodeficiency virus [HIV] disease: Secondary | ICD-10-CM | POA: Diagnosis present

## 2017-05-07 LAB — COMPREHENSIVE METABOLIC PANEL
ALBUMIN: 3.6 g/dL (ref 3.6–5.1)
ALT: 13 U/L (ref 6–29)
AST: 35 U/L (ref 10–35)
Alkaline Phosphatase: 77 U/L (ref 33–115)
BILIRUBIN TOTAL: 0.2 mg/dL (ref 0.2–1.2)
BUN: 23 mg/dL (ref 7–25)
CALCIUM: 9 mg/dL (ref 8.6–10.2)
CO2: 24 mmol/L (ref 20–31)
Chloride: 102 mmol/L (ref 98–110)
Creat: 1.13 mg/dL — ABNORMAL HIGH (ref 0.50–1.10)
Glucose, Bld: 94 mg/dL (ref 65–99)
Potassium: 4.2 mmol/L (ref 3.5–5.3)
Sodium: 136 mmol/L (ref 135–146)
Total Protein: 9.3 g/dL — ABNORMAL HIGH (ref 6.1–8.1)

## 2017-05-07 LAB — CBC
HEMATOCRIT: 35 % (ref 35.0–45.0)
HEMOGLOBIN: 11.1 g/dL — AB (ref 11.7–15.5)
MCH: 30.2 pg (ref 27.0–33.0)
MCHC: 31.7 g/dL — AB (ref 32.0–36.0)
MCV: 95.4 fL (ref 80.0–100.0)
MPV: 10.8 fL (ref 7.5–12.5)
Platelets: 364 10*3/uL (ref 140–400)
RBC: 3.67 MIL/uL — ABNORMAL LOW (ref 3.80–5.10)
RDW: 15.7 % — ABNORMAL HIGH (ref 11.0–15.0)
WBC: 4.6 10*3/uL (ref 3.8–10.8)

## 2017-05-07 LAB — CYTOLOGY - PAP
Bacterial vaginitis: NEGATIVE
Candida vaginitis: POSITIVE — AB
Diagnosis: NEGATIVE
HPV (WINDOPATH): NOT DETECTED
Trichomonas: NEGATIVE

## 2017-05-07 NOTE — Telephone Encounter (Signed)
Patient here for pharmacy clinic. RN saw her in the lobby to make an appointment, patient was crying. RN asked how I could help, she stated she had a recent tragic loss. She asked to speak with a counselor today. Please call patient to schedule an appointment. Andree CossHowell, Luna Audia M, RN

## 2017-05-07 NOTE — Progress Notes (Signed)
HPI: Krystal Bowen is a 46 y.o. female who is here to see pharmacy for adherence and labs for her HIV.   Allergies: No Known Allergies  Vitals:    Past Medical History: Past Medical History:  Diagnosis Date  . Anemia   . HIV infection (HCC)   . Hyperlipidemia   . Hypertension   . Neuropathy     Social History: Social History   Social History  . Marital status: Legally Separated    Spouse name: N/A  . Number of children: 4  . Years of education: N/A   Occupational History  . unemployed     Previously a LawyerCNA for 20 years   Social History Main Topics  . Smoking status: Never Smoker  . Smokeless tobacco: Never Used  . Alcohol use Yes     Comment: social  . Drug use: No  . Sexual activity: Not Currently    Partners: Male    Birth control/ protection: Condom     Comment: accepted condoms   Other Topics Concern  . Not on file   Social History Narrative  . No narrative on file    Previous Regimen: None  Current Regimen: Tivicay and Descovy  Labs: CD4 T Cell Abs (/uL)  Date Value  01/30/2017 100 (L)   Hep B S Ab (no units)  Date Value  01/30/2017 POS (A)   Hepatitis B Surface Ag (no units)  Date Value  01/30/2017 NEGATIVE   HCV Ab (no units)  Date Value  01/30/2017 NEGATIVE    CrCl: CrCl cannot be calculated (Patient's most recent lab result is older than the maximum 21 days allowed.).  Lipids:    Component Value Date/Time   CHOL 162 01/30/2017 1517   TRIG 394 (H) 01/30/2017 1517   HDL 25 (L) 01/30/2017 1517   CHOLHDL 6.5 (H) 01/30/2017 1517   VLDL 79 (H) 01/30/2017 1517   LDLCALC 58 01/30/2017 1517    Assessment: Krystal Bowen came to see us today so we can do a check up on her adherence and check her HIV labs. As she walked through the door, she said that she has had a very emotional week. Her neighbor's son whom she has known for a while has recently committed suicide. There was also another death with one of her close friends' family. She has  missed one dose of her ART during this difficult time. Spent a good part of the visit comforting her. She will set up an appt with Cordelia PenSherry soon for some counseling. Otherwise, she has been doing well on her Descovy and Tivicay. She has an appt coming up with Dr. Orvan Falconerampbell in August.   Recommendations:  Cont Tivicay/Descovy HIV labs today F/u with Dr. Orvan Falconerampbell in August  Ulyses SouthwardMinh Pham, PharmD, BCPS, AAHIVP, CPP Clinical Infectious Disease Pharmacist [facility] 05/07/2017, 11:55 AM

## 2017-05-07 NOTE — Patient Instructions (Signed)
Continue your Tivicay and Descovy We will do labs today Come back and see Dr. Orvan Falconerampbell in August

## 2017-05-07 NOTE — Telephone Encounter (Signed)
Spoke with patient and made appointment for tomorrow.  Krystal AlbertsSherry Fountain Bowen, [facility]

## 2017-05-08 ENCOUNTER — Ambulatory Visit: Payer: Medicaid Other | Admitting: Licensed Clinical Social Worker

## 2017-05-08 LAB — T-HELPER CELL (CD4) - (RCID CLINIC ONLY)
CD4 % Helper T Cell: 8 % — ABNORMAL LOW (ref 33–55)
CD4 T Cell Abs: 190 /uL — ABNORMAL LOW (ref 400–2700)

## 2017-05-09 LAB — HIV-1 RNA QUANT-NO REFLEX-BLD
HIV 1 RNA Quant: 286000 copies/mL — ABNORMAL HIGH
HIV-1 RNA Quant, Log: 5.46 Log copies/mL — ABNORMAL HIGH

## 2017-05-10 ENCOUNTER — Ambulatory Visit (INDEPENDENT_AMBULATORY_CARE_PROVIDER_SITE_OTHER): Payer: Medicaid Other | Admitting: Licensed Clinical Social Worker

## 2017-05-10 ENCOUNTER — Telehealth: Payer: Self-pay | Admitting: Pharmacist Clinician (PhC)/ Clinical Pharmacy Specialist

## 2017-05-10 DIAGNOSIS — Z634 Disappearance and death of family member: Secondary | ICD-10-CM

## 2017-05-10 NOTE — Telephone Encounter (Signed)
Krystal Bowen's labs came back and totally out of control again even though she said that she has been taking her meds. In a nice way, I was relaying the message to her that she was not being honest and not taking it. Explained to her that the only way for her VL to come back like that is by her not taking meds. I hope she would get the message. I'll see her again during the visit with Dr. Orvan Falconerampbell in August.

## 2017-05-14 NOTE — Progress Notes (Signed)
Integrated Behavioral Health Initial Visit  MRN: [id number] Name: Krystal Bowen [place]   Session Start time: 10:25 am Session End time: 11:30 am Total time: 55 minutes  Type of Service: Integrated Behavioral Health- Individual/Family Interpretor:No. Interpretor Name and Language: N/A   Warm Hand Off Completed.       SUBJECTIVE: Krystal Bowen Nardo is a 46 y.o. female accompanied by patient. Patient was referred by Dr. Orvan Falconerampbell for recent death.  Patient reports the following symptoms/concerns: Patient reported her mood as "indifferent" and denies anhedonia, social isolation, hopelessness, sleep or appetite changes, restlessness or fatigue, difficulty concentrating, worry, racing thoughts, reckless and impulsive behaviors, past suicide attempt, and denied current suicidal ideations, plan, or intent.  Patient reported that her neighbor's son committed suicide recently and she is having difficulty adjusting to the suicide death.  Patient reported that she is ruminating on his lack of reaching out for help and his stressors at the time of his death.  Patient was able to identify and verbalize her emotions related to the loss and was able to verbalize helpful coping strategies.  Patient was receptive to the stages of grief and was able to identify her current stage as being in acceptance.  Duration of problem: 1 week; Severity of problem: mild  OBJECTIVE: Mood: sad and Affect: Appropriate Risk of harm to self or others: No plan to harm self or others  Thought process: coherent Thought content: logical   LIFE CONTEXT: Family and Social: Patient has three daughters, ages 7918, 5120, and 9225, and has a 689 year old son.  Patient's three youngest children have the same father, from which the patient is currently separated.  Patient is moving in direction of becoming divorced.  Patient is not dating but husband is involved in a romantic relationship. School/Work: Patient does not work. Self-Care: Patient  walks with a walker but is able to tend to her ADL's. Life Changes: Patient's 46 year old neighbor committed suicide.  Patient's 46 year old daughter and neighbor attended school together and were friends and patient helped raise neighbor.  ASSESSMENT: Patient is currently experiencing normal grief reactions from the loss of her neighbor and may benefit from grief counseling.  GOALS ADDRESSED: Patient will become educated about grief and increase knowledge and/or ability of: coping skills and healthy habits and also: Begin healthy grieving over loss   INTERVENTIONS: Supportive Counseling and Psychoeducation and/or Health Education  Gathered patient's family history and evaluated interaction patterns.  Explored patient's current grief emotions and normalized.  Evaluated patient's coping strategies.  Educated on stages of grief.  PLAN: 1. Follow up with behavioral health clinician on : Thursday July 26th at 1:30 pm 2. Behavioral recommendations: Continue to allow grief emotions to surface and use coping Strategies.  Vergia AlbertsSherry Tedi Hughson, [facility]

## 2017-05-17 ENCOUNTER — Other Ambulatory Visit: Payer: BLUE CROSS/BLUE SHIELD

## 2017-05-17 ENCOUNTER — Ambulatory Visit: Payer: Medicaid Other | Admitting: Licensed Clinical Social Worker

## 2017-05-17 ENCOUNTER — Telehealth: Payer: Self-pay | Admitting: Licensed Clinical Social Worker

## 2017-05-17 NOTE — Telephone Encounter (Signed)
Patient reported that her appointment earlier today ran over and that she would not be able to attend today's appointment, but would like to reschedule for next week.  Krystal Bowen, [facility]

## 2017-05-24 ENCOUNTER — Ambulatory Visit (INDEPENDENT_AMBULATORY_CARE_PROVIDER_SITE_OTHER): Payer: Medicaid Other | Admitting: Licensed Clinical Social Worker

## 2017-05-24 DIAGNOSIS — Z634 Disappearance and death of family member: Secondary | ICD-10-CM

## 2017-05-25 NOTE — Progress Notes (Signed)
Integrated Behavioral Health Follow Up Visit  MRN: [id number] Name: Krystal Bowen   Session Start time: 1:40 pm Session End time: 2:35 pm Total time: 1 hour Number of Integrated Behavioral Health Clinician visits: 2/10  Type of Service: Integrated Behavioral Health- Individual/Family Interpretor:No. Interpretor Name and Language: N/A   Warm Hand Off Completed.       SUBJECTIVE: Krystal Bowen is a 46 y.o. female accompanied by patient. Patient was referred by Dr. Orvan Falconerampbell for recent death.  Patient reviewed her current grief symptoms and denied currently being in distress.  Patient reported that her family is being supportive and is reaching out for support as needed.  Patient reported that she is needing more independence from her parents now that her physical ailments are more manageable. Patient was able to process what her independence means to her and was able to verbalize a plan of action.  Patient discussed her romantic life as living in "solitude" but was able to process the differences between actively being single and being isolated.  Patient was receptive to discussion about the 8 Domains of Wellness and was able to identify her most active domains as emotional, spiritual, and intellectual.  Her least active domain was occupational because she stopped working due to physical ailments.  Patient was receptive to using the domains to share with her family and be mindful throughout the day of managing wellness and making adjustments as needed.   Duration of problem: 1 week; Severity of problem: mild  OBJECTIVE: Mood: Euthymic and Affect: Appropriate Risk of harm to self or others: No plan to harm self or others  Thought process: circumstantial Thought content: logical  ASSESSMENT: Patient is currently experiencing normal grief reactions and normal stage of life issues. Patient may benefit from grief counseling and mental health counseling.   GOALS ADDRESSED: Patient will  continue to educate self about grief and life stages and increase knowledge and/or ability of: coping skills and also: Increase healthy adjustment to current life circumstances  INTERVENTIONS: Supportive Counseling and Psychoeducation and/or Health Education  PLAN: 1. Follow up with behavioral health clinician on : Thurs Aug 9th at 10:15 am 2. Behavioral recommendations: Use knowledge about the domains of wellness to promote healing and wellness   Krystal Bowen, [facility]

## 2017-05-31 ENCOUNTER — Ambulatory Visit (INDEPENDENT_AMBULATORY_CARE_PROVIDER_SITE_OTHER): Payer: Medicaid Other | Admitting: Internal Medicine

## 2017-05-31 ENCOUNTER — Ambulatory Visit (INDEPENDENT_AMBULATORY_CARE_PROVIDER_SITE_OTHER): Payer: Medicaid Other | Admitting: Licensed Clinical Social Worker

## 2017-05-31 ENCOUNTER — Encounter: Payer: Self-pay | Admitting: Internal Medicine

## 2017-05-31 DIAGNOSIS — Z634 Disappearance and death of family member: Secondary | ICD-10-CM

## 2017-05-31 DIAGNOSIS — B20 Human immunodeficiency virus [HIV] disease: Secondary | ICD-10-CM | POA: Diagnosis not present

## 2017-05-31 MED ORDER — BICTEGRAVIR-EMTRICITAB-TENOFOV 50-200-25 MG PO TABS: 1.0000 | ORAL_TABLET | Freq: Every day | ORAL | 11 refills | Status: DC

## 2017-05-31 NOTE — Progress Notes (Signed)
Patient Active Problem List   Diagnosis Date Noted  . HIV disease (HCC) 02/12/2017    Priority: High  . Peripheral neuropathy 02/13/2017  . HTN (hypertension) 02/12/2017  . Normocytic anemia 02/12/2017  . Dyslipidemia 02/12/2017  . History of shingles 02/12/2017    Patient's Medications  New Prescriptions   BICTEGRAVIR-EMTRICITABINE-TENOFOVIR AF (BIKTARVY) 50-200-25 MG TABS TABLET    Take 1 tablet by mouth daily.  Previous Medications   GABAPENTIN (NEURONTIN) 600 MG TABLET    Take 300 mg by mouth 2 (two) times daily.   HYDROCHLOROTHIAZIDE (HYDRODIURIL) 25 MG TABLET    Take 25 mg by mouth.   SULFAMETHOXAZOLE-TRIMETHOPRIM (BACTRIM DS,SEPTRA DS) 800-160 MG TABLET    Take 1 tablet by mouth 3 (three) times a week.  Modified Medications   No medications on file  Discontinued Medications   DOLUTEGRAVIR (TIVICAY) 50 MG TABLET    Take 1 tablet (50 mg total) by mouth daily.   EMTRICITABINE-TENOFOVIR AF (DESCOVY) 200-25 MG TABLET    Take 1 tablet by mouth daily.    Subjective: Carlin is in for her routine HIV follow-up visit. She says that she has not had any trouble getting her Descovy or Tivicay although she follows that up by saying that she needs to get a refill now. She is not sure if her refills come automatically. She says that she was very upset when she had a phone call from our pharmacist, Ulyses Southward recently telling her that her viral load was elevated. She had been under a great deal of stress. A dear friend's son committed suicide, and a son died unexpectedly and a childhood friend's grandmother died all about the same time. She has been meeting with her counselor, Vergia Alberts and finds that helpful. She has been taking her medication around 3 PM daily. She has been crushing her pills.  Review of Systems: Review of Systems  Constitutional: Negative for chills, diaphoresis, fever, malaise/fatigue and weight loss.  HENT: Negative for sore throat.   Respiratory:  Negative for cough, sputum production and shortness of breath.   Cardiovascular: Negative for chest pain.  Gastrointestinal: Negative for abdominal pain, diarrhea, heartburn, nausea and vomiting.  Genitourinary: Negative for dysuria and frequency.  Musculoskeletal: Negative for joint pain and myalgias.  Skin: Negative for rash.  Neurological: Negative for dizziness and headaches.  Psychiatric/Behavioral: Positive for depression. Negative for substance abuse. The patient is not nervous/anxious.     Past Medical History:  Diagnosis Date  . Anemia   . HIV infection (HCC)   . Hyperlipidemia   . Hypertension   . Neuropathy     Social History  Substance Use Topics  . Smoking status: Never Smoker  . Smokeless tobacco: Never Used  . Alcohol use Yes     Comment: social    Family History  Problem Relation Age of Onset  . Heart attack Mother   . Stroke Mother   . Hyperlipidemia Mother   . Cancer Father        Leukemia   . Hyperlipidemia Maternal Grandmother   . Alzheimer's disease Maternal Grandmother   . Heart attack Maternal Grandfather     No Known Allergies  Objective:  Vitals:   05/31/17 0951  BP: (!) 162/112  Pulse: (!) 105  Temp: 98.1 F (36.7 C)  TempSrc: Oral  Weight: 118 lb 12.8 oz (53.9 kg)   Body mass index is 22.08 kg/m.  Physical Exam  Constitutional: She is oriented to  person, place, and time.  She is tearful and upset.  HENT:  Mouth/Throat: No oropharyngeal exudate.  Eyes: Conjunctivae are normal.  Cardiovascular: Normal rate and regular rhythm.   No murmur heard. Pulmonary/Chest: Effort normal and breath sounds normal.  Abdominal: Soft. She exhibits no mass. There is no tenderness.  Musculoskeletal: Normal range of motion.  Neurological: She is alert and oriented to person, place, and time.  Skin: No rash noted.  Psychiatric: Mood and affect normal.    Lab Results Lab Results  Component Value Date   WBC 4.6 05/07/2017   HGB 11.1 (L)  05/07/2017   HCT 35.0 05/07/2017   MCV 95.4 05/07/2017   PLT 364 05/07/2017    Lab Results  Component Value Date   CREATININE 1.13 (H) 05/07/2017   BUN 23 05/07/2017   NA 136 05/07/2017   K 4.2 05/07/2017   CL 102 05/07/2017   CO2 24 05/07/2017    Lab Results  Component Value Date   ALT 13 05/07/2017   AST 35 05/07/2017   ALKPHOS 77 05/07/2017   BILITOT 0.2 05/07/2017    Lab Results  Component Value Date   CHOL 162 01/30/2017   HDL 25 (L) 01/30/2017   LDLCALC 58 01/30/2017   TRIG 394 (H) 01/30/2017   CHOLHDL 6.5 (H) 01/30/2017   Lab Results  Component Value Date   LABRPR NON REAC 01/30/2017   HIV 1 RNA Quant (copies/mL)  Date Value  05/07/2017 286,000 (H)   CD4 T Cell Abs (/uL)  Date Value  05/07/2017 190 (L)  01/30/2017 100 (L)     Problem List Items Addressed This Visit      High   HIV disease (HCC)    I'm sure she has been missing doses of her medication. I talked to her again today about the utmost importance of 100% adherence. She would like to try a 1 pill regimen. I will switch her to [employer]ik[employer]vy. I suggested that she try taking it hold with a tablespoon of applesauce or honey. If she absolutely cannot get it down hold I gave her the go ahead to crush it. I asked her to call me if she has any problems getting her medication down each day. She will follow-up after lab work in 6 weeks.      Relevant Medications   bictegravir-emtricitabine-tenofovir AF (BIKTARVY) 50-200-25 MG TABS tablet   Other Relevant Orders   T-helper cell (CD4)- ([facility] clinic only)   HIV 1 RNA quant-no reflex-bld        Cliffton AstersJohn Shannin Naab, MD [phone] pager   [phone] cell 05/31/2017, 10:15 AM

## 2017-05-31 NOTE — Progress Notes (Signed)
Integrated Behavioral Health Follow Up Visit  MRN: [id number] Name: Krystal CascoCarol D Zimbelman   Session Start time: 10:40 am Session End time: 11:39 am Total time: 1 hour Number of Integrated Behavioral Health Clinician visits: 3/10  Type of Service: Integrated Behavioral Health- Individual/Family Interpretor:No. Interpretor Name and Language: N/A   Warm Hand Off Completed.       SUBJECTIVE: Krystal Bowen is a 46 y.o. female accompanied by patient. Patient was referred by Dr. Orvan Falconerampbell for recent death.  Patient reported with excitement that she may have found a part-time job and has multiple job options for work. Patient was receptive to the connection between increase in finances and the ability to afford housing, which leads to greater independence and personal satisfaction.  Patient reported that overall she is feeling better physically but still takes some pain medication to manage foot nerve pain.  [facility] and patient processed Maslow's Hierarchy of Needs and patient identified with self-actualized traits.  Processed using these traits to help others at the bottom of the pyramid struggling with meeting their basic needs.  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  Patient was able to utilize the concepts of the 8 domains of wellness and the Hierarchy of Needs to verbalize how they combine to lead a fulfilled and healthy life.  Duration of problem: 1 week; Severity of problem: mild  OBJECTIVE: Mood: Euthymic and Affect: Appropriate Risk of harm to self or others: No plan to harm self or others  Thought process: circumstantial Thought content: logical  ASSESSMENT: Patient is currently experiencing normal grief reactions and normal stage of life issues. Patient may benefit from grief counseling and mental health counseling.  GOALS ADDRESSED: Patient will continue to educate self about grief and life stages and  increase knowledge and/or ability of: coping skills and also: Increase healthy adjustment to current life circumstances.  INTERVENTIONS: Supportive Counseling and Psychoeducation and/or Health Education  PLAN: 1. Follow up with behavioral health clinician on : Thurs Aug 16th at 1:30 pm 2. Behavioral recommendations: Use knowledge about the domains of wellness and the Hierarchy of Needs to promote healing and wellness   Krystal Bowen, [facility]

## 2017-05-31 NOTE — Assessment & Plan Note (Signed)
I'm sure she has been missing doses of her medication. I talked to her again today about the utmost importance of 100% adherence. She would like to try a 1 pill regimen. I will switch her to [employer]vy. I suggested that she try taking it hold with a tablespoon of applesauce or honey. If she absolutely cannot get it down hold I gave her the go ahead to crush it. I asked her to call me if she has any problems getting her medication down each day. She will follow-up after lab work in 6 weeks.

## 2017-06-07 ENCOUNTER — Ambulatory Visit: Payer: Medicaid Other | Admitting: Licensed Clinical Social Worker

## 2017-06-14 ENCOUNTER — Ambulatory Visit (INDEPENDENT_AMBULATORY_CARE_PROVIDER_SITE_OTHER): Payer: Medicaid Other | Admitting: Licensed Clinical Social Worker

## 2017-06-14 ENCOUNTER — Ambulatory Visit (INDEPENDENT_AMBULATORY_CARE_PROVIDER_SITE_OTHER): Payer: Medicaid Other | Admitting: Pharmacist

## 2017-06-14 ENCOUNTER — Ambulatory Visit: Payer: Medicaid Other

## 2017-06-14 DIAGNOSIS — R21 Rash and other nonspecific skin eruption: Secondary | ICD-10-CM

## 2017-06-14 DIAGNOSIS — Z634 Disappearance and death of family member: Secondary | ICD-10-CM

## 2017-06-14 MED ORDER — DIPHENHYDRAMINE HCL 25 MG PO TABS: 25.0000 mg | ORAL_TABLET | Freq: Four times a day (QID) | ORAL | 0 refills | Status: DC | PRN

## 2017-06-14 NOTE — Progress Notes (Signed)
HPI: Krystal Bowen is a 46 y.o. female who presents to the [facility] to see Cordelia Pen.  She asked to see someone regarding a skin rash.   Allergies: No Known Allergies  Past Medical History: Past Medical History:  Diagnosis Date  . Anemia   . HIV infection (HCC)   . Hyperlipidemia   . Hypertension   . Neuropathy     Social History: Social History   Social History  . Marital status: Legally Separated    Spouse name: N/A  . Number of children: 4  . Years of education: N/A   Occupational History  . unemployed     Previously a Lawyer for 20 years   Social History Main Topics  . Smoking status: Never Smoker  . Smokeless tobacco: Never Used  . Alcohol use Yes     Comment: social  . Drug use: No  . Sexual activity: Not Currently    Partners: Male    Birth control/ protection: Condom     Comment: accepted condoms   Other Topics Concern  . Not on file   Social History Narrative  . No narrative on file    Current Regimen: Biktarvy  Labs: HIV 1 RNA Quant (copies/mL)  Date Value  05/07/2017 286,000 (H)   CD4 T Cell Abs (/uL)  Date Value  05/07/2017 190 (L)  01/30/2017 100 (L)   Hep B S Ab (no units)  Date Value  01/30/2017 POS (A)   Hepatitis B Surface Ag (no units)  Date Value  01/30/2017 NEGATIVE   HCV Ab (no units)  Date Value  01/30/2017 NEGATIVE    CrCl: CrCl cannot be calculated (Patient's most recent lab result is older than the maximum 21 days allowed.).  Lipids:    Component Value Date/Time   CHOL 162 01/30/2017 1517   TRIG 394 (H) 01/30/2017 1517   HDL 25 (L) 01/30/2017 1517   CHOLHDL 6.5 (H) 01/30/2017 1517   VLDL 79 (H) 01/30/2017 1517   LDLCALC 58 01/30/2017 1517    Assessment: Krystal Bowen is here today to see Cordelia Pen, our counselor. As she was leaving, she asked to see someone regarding a skin rash that she had on her face.  I brought her back to look at the rash. She states it started around the same time she started the Biktarvy and  Bactrim. It is just mainly on her face and a little bit on her chest.  It itches pretty badly she states. She has not had any other new medications, no new laundry detergent, face wash, shampoo, body wash, lotion, or make up.  She has had no change in her diet. I brought Krystal Bowen in to take a look too and we think it is due to the Bactrim.  Will tell her to stop since her CD4 is close to 200 (190). I will also send in some Benadryl for her since it is itching so much.   Plans: - Continue Biktarvy - Stop Bactrim for now - Bendadryl PRN for itching  Cassie L. Kuppelweiser, PharmD, CPP Infectious Diseases Clinical Pharmacist [facility] 06/14/2017, 3:03 PM

## 2017-06-15 NOTE — BH Specialist Note (Signed)
Integrated Behavioral Health Follow Up Visit  MRN: [id number] Name: Krystal Bowen   Session Start time: 1:36 pm Session End time: 2:36 pm Total time: 1 hour Number of Integrated Behavioral Health Clinician visits: 4/10  Type of Service: Integrated Behavioral Health- Individual/Family Interpretor:No. Interpretor Name and Language: N/A   SUBJECTIVE: Krystal Bowen is a 46 y.o. female accompanied by patient. Patient was referred by Dr. Orvan Falconer for recent death.  Patient provided an update of her functionality within the past two weeks.  Patient reported that she has been going on interviews and recently was hired by [employer] and starts work next week.  Patient was excited about the news and was receptive to not overwhelming her self.  Patient reported that she feels good about the position and is also thinking about starting a catering business.  Patient reported that she has been spending free time taking classes, finding resources, and learning how to write a business plan.   Patient also updated on her therapeutic progress.  Patient reported that her sleep has been improving due to less night pain.  Additionally she reported that after having a setback due to her health, she feels her life is progressing now.  Patient stated, "The past few weeks have been really good for me and looking at things differently".  Patient credited her gains to receiving behavioral health services, receiving psychoeducation, and talking through her plans for increased independence.  Patient reported that she is continuing to manage grief over the death of her neighbor and that the death was a reminder of what she wants to accomplish in her life.  [facility] addressed termination of services after the sixth session and patient expressed interest in continuing services with community mental health providers.  Duration of problem: 1 week; Severity of problem: mild  OBJECTIVE: Mood: Euthymic and Affect:  Appropriate Risk of harm to self or others: No plan to harm self or others  Thought process: coherent Thought content: logical  ASSESSMENT: Patient is currently experiencing normal grief reactions and normal stage of life issues. Patient may benefit from grief counseling and mental health counseling.  Patient has experienced increased sleep and increased independence since beginning behavioral health services.  Patient has been receptive to change in increasing her life satisfaction.  GOALS ADDRESSED: Patient will continue to educate self about grief and life stages and increase knowledge and/or ability of: coping skills and also: Increase healthy adjustment to current life circumstances  INTERVENTIONS: Motivational Interviewing   PLAN: 1. Follow up with behavioral health clinician on : Thurs Aug 30th at 1:30 pm 2. Behavioral recommendations: Use knowledge about the domains of wellness and the Hierarchy of Needs to promote healing and wellness 3. At next session will address termination and referral and continuing to support and plan for patient's increased independence.  Vergia Alberts, [facility]

## 2017-06-21 ENCOUNTER — Ambulatory Visit (INDEPENDENT_AMBULATORY_CARE_PROVIDER_SITE_OTHER): Payer: Medicaid Other | Admitting: Licensed Clinical Social Worker

## 2017-06-21 DIAGNOSIS — Z600 Problems of adjustment to life-cycle transitions: Secondary | ICD-10-CM

## 2017-06-21 DIAGNOSIS — Z634 Disappearance and death of family member: Secondary | ICD-10-CM

## 2017-06-21 NOTE — BH Specialist Note (Signed)
Integrated Behavioral Health Follow Up Visit  MRN: [id number] Name: Krystal CascoCarol D Lindholm   Session Start time: 1:35 pm Session End time: 2:25 pm Total time: 50 minutes Number of Integrated Behavioral Health Clinician visits: 5/10  Type of Service: Integrated Behavioral Health- Individual/Family Interpretor:No. Interpretor Name and Language: N/A   SUBJECTIVE: Krystal CascoCarol D Alguire is a 46 y.o. female accompanied by patient. Patient was referred by Dr. Orvan Falconerampbell for recent death.  Patient reported that she started her new job yesterday and informed of the specifics.  Patient reported that she did well physically and that she liked what she was doing.  Patient was receptive to need to watch for becoming overwhelmed and incorporating relaxation into her day.  Patient was also receptive to suggestions on ways to enhance emotional wellbeing such as adding an app for her smartphone or journaling to review her use of optimism and positive thinking.    [facility] and [facility] also addressed termination and [facility] provided patient with three provider referrals from [facility] directory, contact information for [facility], [facility], and [facility] agencies, mobile crisis numbers, and suicide hotline numbers.  [facility] asked patient to make contact with one and schedule an appointment to allow for continuity of care.  Patient was receptive to this.  Duration of problem: weeks; Severity of problem: mild  OBJECTIVE: Mood: Euthymic and Affect: Appropriate Risk of harm to self or others: No plan to harm self or others  Thought process: coherent Thought content: logical  ASSESSMENT: Patient is currently experiencing normal grief reactions and normal stage of life issues. Patient may benefit from grief counseling and mental health counseling.  Patient has experienced increased sleep and increased independence since beginning behavioral health services.  Patient has been receptive to change in increasing her life satisfaction.  GOALS  ADDRESSED: Patient will continue to educate self about grief and life stages and increase knowledge and/or ability of: coping skills and also: Increase healthy adjustment to current life circumstances.  INTERVENTIONS: Supportive Counseling and Psychoeducation and/or Health Education  PLAN: 1. Follow up with behavioral health clinician on : Thurs Sept 6th at 1:30 pm 2. Referral(s): Community Mental health agencies 3. Behavioral recommendations: Call referral sources before next session to make an appointment.  Continue to use learned coping strategies and education outside of session to promote healing and wellness. 4. Next session is last session and will review progress.  Vergia AlbertsSherry Menaal Russum, [facility]

## 2017-06-28 ENCOUNTER — Ambulatory Visit (INDEPENDENT_AMBULATORY_CARE_PROVIDER_SITE_OTHER): Payer: Medicaid Other | Admitting: Licensed Clinical Social Worker

## 2017-06-28 DIAGNOSIS — Z634 Disappearance and death of family member: Secondary | ICD-10-CM

## 2017-07-03 NOTE — BH Specialist Note (Signed)
Integrated Behavioral Health Follow Up Visit  MRN: [id number] Name: Krystal Bowen   Session Start time: 1:50 pm Session End time: 2:30 pm Total time: 40 minutes Number of Integrated Behavioral Health Clinician visits: 6/10  Type of Service: Integrated Behavioral Health- Individual/Family Interpretor:No. Interpretor Name and Language: N/A   SUBJECTIVE: Krystal Bowen is a 46 y.o. female accompanied by patient. Patient was referred by Dr. Orvan Falconerampbell for recent death.  Patient reports the following symptoms/concerns: Patient reported that she has been attempting to schedule an appointment with a therapist for continuation of care.  [facility] and [name] processed her anxiety related to termination of services and locating another therapist.  Patient informed that her medical issues are clearing up, making more room for participation in physical activities.  Patient updated on her work and reported that her hours are changing.  Patient informed that she will continue to monitor her health for complications from longer work hours.  Patient presented as receptive and engaged with seeking opportunities for personal development, ways to challenge herself, and return to previous functioning.  Duration of problem: weeks; Severity of problem: mild  OBJECTIVE: Mood: cheerful and Affect: Appropriate Risk of harm to self or others: No plan to harm self or others  Thought process: circumstantial Thought content: logical  ASSESSMENT: Patient has reported a reduction in intensity of grief, in that it is more manageable.  Patient reported that her sleep varies but she feels she gets more consistent sleep. Patient has been receptive to change in increasing her life satisfaction.  Patient can benefit from continued behavioral health services to address symptoms and further development.  GOALS ADDRESSED: Patient will continue to educate self about grief and life stages and increase knowledge and/or ability of:  coping skillsand also: Increase healthy adjustment to current life circumstances.  INTERVENTIONS: Supportive Counseling and Psychoeducation and/or Health Education  PLAN: 1. Referral(s): Community Mental Health Services (LME/Outside Clinic) 2. "From scale of 1-10, how likely are you to follow plan?": 10  [name], [facility]

## 2017-08-21 ENCOUNTER — Other Ambulatory Visit: Payer: Medicaid Other

## 2017-09-04 ENCOUNTER — Ambulatory Visit: Payer: Medicaid Other | Admitting: Internal Medicine

## 2017-11-13 ENCOUNTER — Telehealth: Payer: Self-pay | Admitting: *Deleted

## 2017-11-13 ENCOUNTER — Ambulatory Visit: Payer: Medicaid Other | Admitting: Internal Medicine

## 2017-11-13 NOTE — Telephone Encounter (Signed)
Patient is detectable and has missed several visits. Will refer to [facility] per verbal order from Dr Orvan Falconerampbell. Andree CossHowell, Kaysee Hergert M, RN

## 2017-11-19 NOTE — Telephone Encounter (Signed)
Sure I can!  I think its just [facility] and [facility] that can't be together.

## 2017-12-30 ENCOUNTER — Encounter (HOSPITAL_COMMUNITY): Payer: Self-pay | Admitting: Emergency Medicine

## 2017-12-30 ENCOUNTER — Emergency Department (HOSPITAL_COMMUNITY): Payer: Medicaid Other

## 2017-12-30 ENCOUNTER — Emergency Department (HOSPITAL_COMMUNITY)
Admission: EM | Admit: 2017-12-30 | Discharge: 2017-12-30 | Disposition: A | Discharge status: Home / Self Care | Payer: Medicaid Other | Attending: Emergency Medicine | Admitting: Emergency Medicine

## 2017-12-30 DIAGNOSIS — Z79899 Other long term (current) drug therapy: Secondary | ICD-10-CM | POA: Diagnosis not present

## 2017-12-30 DIAGNOSIS — Z9114 Patient's other noncompliance with medication regimen: Secondary | ICD-10-CM | POA: Diagnosis not present

## 2017-12-30 DIAGNOSIS — R51 Headache: Secondary | ICD-10-CM | POA: Diagnosis present

## 2017-12-30 DIAGNOSIS — B20 Human immunodeficiency virus [HIV] disease: Secondary | ICD-10-CM | POA: Diagnosis not present

## 2017-12-30 DIAGNOSIS — I10 Essential (primary) hypertension: Secondary | ICD-10-CM | POA: Diagnosis not present

## 2017-12-30 DIAGNOSIS — G43109 Migraine with aura, not intractable, without status migrainosus: Secondary | ICD-10-CM | POA: Diagnosis not present

## 2017-12-30 LAB — BASIC METABOLIC PANEL
Anion gap: 9 (ref 5–15)
BUN: 15 mg/dL (ref 6–20)
CHLORIDE: 101 mmol/L (ref 101–111)
CO2: 27 mmol/L (ref 22–32)
Calcium: 9.3 mg/dL (ref 8.9–10.3)
Creatinine, Ser: 0.79 mg/dL (ref 0.44–1.00)
GFR calc Af Amer: >60 mL/min (ref >60)
GFR calc non Af Amer: >60 mL/min (ref >60)
Glucose, Bld: 92 mg/dL (ref 65–99)
POTASSIUM: 4.3 mmol/L (ref 3.5–5.1)
SODIUM: 137 mmol/L (ref 135–145)

## 2017-12-30 LAB — RAPID URINE DRUG SCREEN, HOSP PERFORMED
AMPHETAMINES: NOT DETECTED
BENZODIAZEPINES: NOT DETECTED
Barbiturates: NOT DETECTED
Cocaine: NOT DETECTED
Opiates: NOT DETECTED
Tetrahydrocannabinol: NOT DETECTED

## 2017-12-30 LAB — CBC WITH DIFFERENTIAL/PLATELET
Basophils Absolute: 0 10*3/uL (ref 0.0–0.1)
Basophils Relative: 0 %
EOS ABS: 0.1 10*3/uL (ref 0.0–0.7)
Eosinophils Relative: 2 %
HEMATOCRIT: 34.4 % — AB (ref 36.0–46.0)
HEMOGLOBIN: 10.8 g/dL — AB (ref 12.0–15.0)
LYMPHS ABS: 0.6 10*3/uL — AB (ref 0.7–4.0)
LYMPHS PCT: 19 %
MCH: 30.3 pg (ref 26.0–34.0)
MCHC: 31.4 g/dL (ref 30.0–36.0)
MCV: 96.4 fL (ref 78.0–100.0)
Monocytes Absolute: 0.5 10*3/uL (ref 0.1–1.0)
Monocytes Relative: 17 %
NEUTROS ABS: 2 10*3/uL (ref 1.7–7.7)
NEUTROS PCT: 62 %
Platelets: 212 10*3/uL (ref 150–400)
RBC: 3.57 MIL/uL — AB (ref 3.87–5.11)
RDW: 15.5 % (ref 11.5–15.5)
WBC: 3.3 10*3/uL — ABNORMAL LOW (ref 4.0–10.5)

## 2017-12-30 LAB — CRYPTOCOCCAL ANTIGEN: Crypto Ag: NEGATIVE

## 2017-12-30 LAB — PREGNANCY, URINE: Preg Test, Ur: NEGATIVE

## 2017-12-30 NOTE — ED Notes (Signed)
Pt verbalizes understanding of d/c instructions. Pt ambulatory at d/c with all belongings.   

## 2017-12-30 NOTE — ED Triage Notes (Signed)
Pt. Stated, I started a new job and they were concerned with my headache. I had another headache this morning when I woke up.

## 2017-12-30 NOTE — Discharge Instructions (Signed)
Take your HIV medications EVERYDAY PLEASE!

## 2017-12-31 ENCOUNTER — Telehealth: Payer: Self-pay | Admitting: *Deleted

## 2017-12-31 LAB — T-HELPER CELLS (CD4) COUNT (NOT AT ARMC)
CD4 % Helper T Cell: 7 % — ABNORMAL LOW (ref 33–55)
CD4 T Cell Abs: 70 /uL — ABNORMAL LOW (ref 400–2700)

## 2017-12-31 NOTE — ED Provider Notes (Signed)
[facility] Note   CSN: [id number] Arrival date & time: 12/30/17  [id number]     History   Chief Complaint Chief Complaint  Patient presents with  . Headache    HPI Krystal Bowen is a 47 y.o. female.  HPI Patient is a 47 year old female with a history of HIV and poor compliance with her antiviral medications.  She presents to the emergency department with headache that lasted for 10-15 minutes and resulted in mild blurred vision in her right eye and left-sided numbness.  She denies weakness.  No headache at this time.  She had a similar episode 2 weeks ago with headache associated with neurologic symptoms.  No prior history of stroke.  She does have compliance issues with her HIV antiviral medications.  Her last CD4 was in the summer 2018 and her CD4 count was 190 with a viral load of greater than 200,000.  She freely admits to compliance issues and is trying to work on this.  Asymptomatic at this time.  No neck pain or neck stiffness   Past Medical History:  Diagnosis Date  . Anemia   . HIV infection (HCC)   . Hyperlipidemia   . Hypertension   . Neuropathy     Patient Active Problem List   Diagnosis Date Noted  . Peripheral neuropathy 02/13/2017  . HIV disease (HCC) 02/12/2017  . HTN (hypertension) 02/12/2017  . Normocytic anemia 02/12/2017  . Dyslipidemia 02/12/2017  . History of shingles 02/12/2017    History reviewed. No pertinent surgical history.  OB History    No data available       Home Medications    Prior to Admission medications   Medication Sig Start Date End Date Taking? Authorizing Provider  bictegravir-emtricitabine-tenofovir AF (BIKTARVY) 50-200-25 MG TABS tablet Take 1 tablet by mouth daily. 05/31/17   Cliffton Astersampbell, [name], MD  diphenhydrAMINE (BENADRYL) 25 MG tablet Take 1 tablet (25 mg total) by mouth every 6 (six) hours as needed for itching. 06/14/17   Cliffton Astersampbell, [name], MD  gabapentin (NEURONTIN) 600 MG  tablet Take 300 mg by mouth 2 (two) times daily.    [provider]  hydrochlorothiazide (HYDRODIURIL) 25 MG tablet Take 25 mg by mouth. 07/15/12   [provider]  sulfamethoxazole-trimethoprim (BACTRIM DS,SEPTRA DS) 800-160 MG tablet Take 1 tablet by mouth 3 (three) times a week. 02/14/17   Blanchard Kelchixon, Stephanie N, NP    Family History Family History  Problem Relation Age of Onset  . Heart attack Mother   . Stroke Mother   . Hyperlipidemia Mother   . Cancer Father        Leukemia   . Hyperlipidemia Maternal Grandmother   . Alzheimer's disease Maternal Grandmother   . Heart attack Maternal Grandfather     Social History Social History   Tobacco Use  . Smoking status: Never Smoker  . Smokeless tobacco: Never Used  Substance Use Topics  . Alcohol use: Yes    Comment: social  . Drug use: No     Allergies   Patient has no known allergies.   Review of Systems Review of Systems  All other systems reviewed and are negative.    Physical Exam Updated Vital Signs BP (!) 156/109   Pulse 85   Temp 98.6 F (37 C) (Oral)   Resp 16   Ht 5\' 1"  (1.549 m)   Wt 52.2 kg (115 lb)   LMP 11/25/2017   SpO2 100%   BMI 21.73  kg/m   Physical Exam  Constitutional: She is oriented to person, place, and time. She appears well-developed and well-nourished. No distress.  HENT:  Head: Normocephalic and atraumatic.  Eyes: EOM are normal. Pupils are equal, round, and reactive to light.  Neck: Normal range of motion.  Cardiovascular: Normal rate, regular rhythm and normal heart sounds.  Pulmonary/Chest: Effort normal and breath sounds normal.  Abdominal: Soft. She exhibits no distension. There is no tenderness.  Musculoskeletal: Normal range of motion.  Neurological: She is alert and oriented to person, place, and time.  5/5 strength in major muscle groups of  bilateral upper and lower extremities. Speech normal. No facial asymetry.   Skin: Skin is warm and dry.    Psychiatric: She has a normal mood and affect. Judgment normal.  Nursing note and vitals reviewed.    ED Treatments / Results  Labs (all labs ordered are listed, but only abnormal results are displayed) Labs Reviewed  CBC WITH DIFFERENTIAL/PLATELET - Abnormal; Notable for the following components:      Result Value   WBC 3.3 (*)    RBC 3.57 (*)    Hemoglobin 10.8 (*)    HCT 34.4 (*)    Lymphs Abs 0.6 (*)    All other components within normal limits  BASIC METABOLIC PANEL  RAPID URINE DRUG SCREEN, HOSP PERFORMED  PREGNANCY, URINE  CRYPTOCOCCAL ANTIGEN  T-HELPER CELLS (CD4) COUNT (NOT AT [facility])    EKG  EKG Interpretation None       Radiology Ct Head Wo Contrast  Result Date: 12/30/2017 CLINICAL DATA:  Headache and slurred speech. EXAM: CT HEAD WITHOUT CONTRAST TECHNIQUE: Contiguous axial images were obtained from the base of the skull through the vertex without intravenous contrast. COMPARISON:  10/18/2004 FINDINGS: Brain: No mass lesion, hemorrhage, hydrocephalus, acute infarct, intra-axial, or extra-axial fluid collection. Vascular: No hyperdense vessel or unexpected calcification. Skull: Normal Sinuses/Orbits: Normal imaged portions of the orbits and globes. Clear paranasal sinuses and mastoid air cells. Cerumen in the external ear canals. Other: None. IMPRESSION: Normal head CT. Electronically Signed   By: Jeronimo Greaves M.D.   On: 12/30/2017 13:20    Procedures Procedures (including critical care time)  Medications Ordered in ED Medications - No data to display   Initial Impression / Assessment and Plan / ED Course  I have reviewed the triage vital signs and the nursing notes.  Pertinent labs & imaging results that were available during my care of the patient were reviewed by me and considered in my medical decision making (see chart for details).     Overall well-appearing.  Her symptoms sound like [facility] migraines.  CT scan of her head is negative.  She does  not have any neck symptoms.  Given her poor compliance with her antiviral medications I was considering the possibility of Her coccal meningitis.  I discussed the case with Dr. Luciana Axe of infectious disease who reports with an alternative explanation it is unlikely.  He recommends cryptococcal antigen via the serum.  He reports if this is negative she does not need to move forward with lumbar puncture today.  If her cryptococcal antigen via the blood is positive he recommends LP.  Cryptococcal serum antigen negative.  Close follow-up infectious disease clinic.  Dr. Luciana Axe will help arrange close follow up this week in the office  Final Clinical Impressions(s) / ED Diagnoses   Final diagnoses:  Complicated migraine  HIV (human immunodeficiency virus infection) (HCC)  H/O medication noncompliance    ED  Discharge Orders    None       Azalia Bilis, MD 12/31/17 1500

## 2017-12-31 NOTE — Telephone Encounter (Signed)
Krystal Bowen will come tomorrow to see Dr Orvan Falconerampbell, states she knows she should be taking her medication daily, even though it is a constant reminder.  She feels this episode was a scare big enough to encourage her to get back into care. She is also wondering about a neurology referral mentioned in the ED.  Since she has Medicaid, this likely must come from her PCP. Krystal Bowen, Krystal Pickeral M, RN

## 2017-12-31 NOTE — Telephone Encounter (Signed)
-----   Message from Gardiner Barefootobert W Comer, MD sent at 12/30/2017  1:59 PM EDT ----- B20, poor compliance.  Needs to get into clinic this week.  Seen in the ED Sunday and is expecting a call. Possibly involve Ambre.  thanks

## 2018-01-01 ENCOUNTER — Telehealth: Payer: Self-pay | Admitting: *Deleted

## 2018-01-01 ENCOUNTER — Ambulatory Visit (INDEPENDENT_AMBULATORY_CARE_PROVIDER_SITE_OTHER): Payer: Medicaid Other | Admitting: Internal Medicine

## 2018-01-01 ENCOUNTER — Encounter: Payer: Self-pay | Admitting: Internal Medicine

## 2018-01-01 DIAGNOSIS — I10 Essential (primary) hypertension: Secondary | ICD-10-CM | POA: Diagnosis not present

## 2018-01-01 DIAGNOSIS — F329 Major depressive disorder, single episode, unspecified: Secondary | ICD-10-CM | POA: Insufficient documentation

## 2018-01-01 DIAGNOSIS — F331 Major depressive disorder, recurrent, moderate: Secondary | ICD-10-CM

## 2018-01-01 DIAGNOSIS — B20 Human immunodeficiency virus [HIV] disease: Secondary | ICD-10-CM

## 2018-01-01 DIAGNOSIS — F32A Depression, unspecified: Secondary | ICD-10-CM | POA: Insufficient documentation

## 2018-01-01 MED ORDER — SULFAMETHOXAZOLE-TRIMETHOPRIM 800-160 MG PO TABS: 1.0000 | ORAL_TABLET | ORAL | 1 refills | Status: DC

## 2018-01-01 MED ORDER — HYDROCHLOROTHIAZIDE 25 MG PO TABS: 25.0000 mg | ORAL_TABLET | Freq: Every day | ORAL | 11 refills | Status: DC

## 2018-01-01 MED ORDER — BICTEGRAVIR-EMTRICITAB-TENOFOV 50-200-25 MG PO TABS: 1.0000 | ORAL_TABLET | Freq: Every day | ORAL | 11 refills | Status: DC

## 2018-01-01 NOTE — Progress Notes (Signed)
[facility] Visit  HPI: Krystal Bowen is a 47 y.o. female who presents to the [facility] clinic today to follow-up with Dr. Orvan Falconerampbell for her HIV infection.  Patient Active Problem List   Diagnosis Date Noted  . Peripheral neuropathy 02/13/2017  . HIV disease (HCC) 02/12/2017  . HTN (hypertension) 02/12/2017  . Normocytic anemia 02/12/2017  . Dyslipidemia 02/12/2017  . History of shingles 02/12/2017    Patient's Medications  New Prescriptions   No medications on file  Previous Medications   BICTEGRAVIR-EMTRICITABINE-TENOFOVIR AF (BIKTARVY) 50-200-25 MG TABS TABLET    Take 1 tablet by mouth daily.   DIPHENHYDRAMINE (BENADRYL) 25 MG TABLET    Take 1 tablet (25 mg total) by mouth every 6 (six) hours as needed for itching.   GABAPENTIN (NEURONTIN) 600 MG TABLET    Take 300 mg by mouth 2 (two) times daily.   HYDROCHLOROTHIAZIDE (HYDRODIURIL) 25 MG TABLET    Take 25 mg by mouth.   SULFAMETHOXAZOLE-TRIMETHOPRIM (BACTRIM DS,SEPTRA DS) 800-160 MG TABLET    Take 1 tablet by mouth 3 (three) times a week.  Modified Medications   No medications on file  Discontinued Medications   No medications on file    Allergies: No Known Allergies  Past Medical History: Past Medical History:  Diagnosis Date  . Anemia   . HIV infection (HCC)   . Hyperlipidemia   . Hypertension   . Neuropathy     Social History: Social History   Socioeconomic History  . Marital status: Legally Separated    Spouse name: None  . Number of children: 4  . Years of education: None  . Highest education level: None  Social Needs  . Financial resource strain: None  . Food insecurity - worry: None  . Food insecurity - inability: None  . Transportation needs - medical: None  . Transportation needs - non-medical: None  Occupational History  . Occupation: unemployed    Comment: Previously a LawyerCNA for 20 years  Tobacco Use  . Smoking status: Never Smoker  . Smokeless tobacco: Never  Used  Substance and Sexual Activity  . Alcohol use: Yes    Comment: social  . Drug use: No  . Sexual activity: Not Currently    Partners: Male    Birth control/protection: Condom    Comment: accepted condoms  Other Topics Concern  . None  Social History Narrative  . None    Labs: HIV 1 RNA Quant (copies/mL)  Date Value  05/07/2017 286,000 (H)   CD4 T Cell Abs (/uL)  Date Value  12/30/2017 70 (L)  05/07/2017 190 (L)  01/30/2017 100 (L)   Hep B S Ab (no units)  Date Value  01/30/2017 POS (A)   Hepatitis B Surface Ag (no units)  Date Value  01/30/2017 NEGATIVE   HCV Ab (no units)  Date Value  01/30/2017 NEGATIVE    Lipids:    Component Value Date/Time   CHOL 162 01/30/2017 1517   TRIG 394 (H) 01/30/2017 1517   HDL 25 (L) 01/30/2017 1517   CHOLHDL 6.5 (H) 01/30/2017 1517   VLDL 79 (H) 01/30/2017 1517   LDLCALC 58 01/30/2017 1517    Current HIV Regimen: Biktarvy  Assessment: Krystal Bowen is here today to see Dr. Orvan Falconerampbell for her HIV infection. She was started on Biktarvy last August for which she took for a few weeks (crushing it and putting it in applesauce) but then stopped.  She was seen in the ED on Sunday  with headache and blurred vision. She has a very hard time coping with the HIV diagnosis.  She feels like every time she gets her bottle of Biktarvy out that it is a constant reminder of a bad decision she made. Her mental state about her diagnosis constantly gets in the way of her taking her ARTs.   I spent time discussing this with her and the need to take medications daily.  She knows she needs to but cannot overcome to mental barrier.  She will meet with Marylu Lund our counselor soon to discuss this issue.  I suggested putting all of her medications in a weekly pill box and thinking of the Biktarvy as a Motrin tablet (they look very similar) and that way she physically does not have to see the bottle each day.  She doesn't think it will work but she is willing to try.   I also gave her a pillbox key chain.  Her current HIV viral load is 286,000 and her CD4 count is low at 70.  She has taken her Biktarvy for the last couple of days after her recent ED visit.  She states that frightened her into taking it again.  She will see me again in ~2 weeks for adherence follow-up and labs.  Plan: - Continue Biktarvy PO once daily - Restart Bactrim for PCP ppx - F/u with me again 3/28 at 11am  Cassie L. Kuppelweiser, PharmD, AAHIVP, CPP Infectious Diseases Clinical Pharmacist [facility] 01/01/2018, 11:02 AM

## 2018-01-01 NOTE — Telephone Encounter (Signed)
Sounds great - thanks Ambre!

## 2018-01-01 NOTE — Telephone Encounter (Signed)
Referral received to engage with Ms Jewel BaizeCraven. Call made to her today, I apologized for missing her AM appt today and she and I openly talked about my role with [facility] and how I would love to be a part of her Health care team.  Ms Jewel BaizeCraven stated she discussed with Dr Orvan Falconerampbell and Wilford Sportsassie that taking the medication is psychological for her and is a consent reminder that she is positive. 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 Okey RegalCarol stated a recent scare has helped her to realize that she must adhere to her medications. After actively listening,  I shared similar stories to support and encourage her. I asked her to please call or text me when times get rough, life sucks and being positive feels like it's more that she can bear. Ms Terence LuxCarol thanked me and asked if we could meet during her appt with Cassie on the 28th and stated she will save my number at this time. Arranged an appt for the 28th to do a face to face meeting with Okey Regalarol.

## 2018-01-01 NOTE — Assessment & Plan Note (Signed)
She continues to have extreme difficulty with adherence.  I talked to her at length today about our need to flip her script about HIV.  She views of the services we provide here and her Biktarvy as reminders of something bad rather than a positive way to deal with HIV.  Because of her recent headache she is more motivated to get back on therapy.  I have refilled her medications.  She met today with our ID pharmacist to review adherence tips.  She will follow-up with pharmacy in 2 weeks and back to see me in 4 weeks.

## 2018-01-01 NOTE — Assessment & Plan Note (Signed)
We will arrange for her to come back and meet with our behavioral health counselor.

## 2018-01-01 NOTE — Progress Notes (Signed)
Patient Active Problem List   Diagnosis Date Noted  . HIV disease ([place]) 02/12/2017    Priority: High  . Depression 01/01/2018  . Peripheral neuropathy 02/13/2017  . HTN (hypertension) 02/12/2017  . Normocytic anemia 02/12/2017  . Dyslipidemia 02/12/2017  . History of shingles 02/12/2017    Patient's Medications  New Prescriptions   No medications on file  Previous Medications   DIPHENHYDRAMINE (BENADRYL) 25 MG TABLET    Take 1 tablet (25 mg total) by mouth every 6 (six) hours as needed for itching.   GABAPENTIN (NEURONTIN) 600 MG TABLET    Take 300 mg by mouth 2 (two) times daily.  Modified Medications   Modified Medication Previous Medication   BICTEGRAVIR-EMTRICITABINE-TENOFOVIR AF (BIKTARVY) 50-200-25 MG TABS TABLET bictegravir-emtricitabine-tenofovir AF (BIKTARVY) 50-200-25 MG TABS tablet      Take 1 tablet by mouth daily.    Take 1 tablet by mouth daily.   HYDROCHLOROTHIAZIDE (HYDRODIURIL) 25 MG TABLET hydrochlorothiazide (HYDRODIURIL) 25 MG tablet      Take 1 tablet (25 mg total) by mouth daily.    Take 25 mg by mouth.   SULFAMETHOXAZOLE-TRIMETHOPRIM (BACTRIM DS,SEPTRA DS) 800-160 MG TABLET sulfamethoxazole-trimethoprim (BACTRIM DS,SEPTRA DS) 800-160 MG tablet      Take 1 tablet by mouth 3 (three) times a week.    Take 1 tablet by mouth 3 (three) times a week.  Discontinued Medications   No medications on file    Subjective: Krystal Bowen is seen today for emergency department follow-up visit.  When she was here last August we started her on Biktarvy as a salvage regimen.  She tells me that she did take it for a short period of time.  She used a pill crusher to crush it and mixed it with applesauce.  She says that after a very short period of time she stopped taking it.  She says that her extended family and her best friends are all aware of her HIV infection in her supportive but she describes feeling like she has a weight on her shoulders.  Getting her medication at the  pharmacy, seeing the bottle and trying to take [place] or all daily reminders of a bad decision she made a years ago.  She describes her problems as all being "mental".  She was seeing our counselor here who has now left but she is amenable to seeing our new counselor.  She was recently in the emergency department with a headache.  Her cryptococcal antigen was negative.  She is feeling better now but states that she became frightened when she developed a headache and started taking Biktarvy again.  Review of Systems: Review of Systems  Constitutional: Positive for malaise/fatigue. Negative for chills, diaphoresis, fever and weight loss.  HENT: Negative for sore throat.   Respiratory: Negative for cough, sputum production and shortness of breath.   Cardiovascular: Negative for chest pain.  Gastrointestinal: Negative for abdominal pain, diarrhea, heartburn, nausea and vomiting.  Genitourinary: Negative for dysuria and frequency.  Musculoskeletal: Negative for joint pain and myalgias.  Skin: Negative for rash.  Neurological: Positive for headaches. Negative for dizziness.  Psychiatric/Behavioral: Positive for depression. Negative for substance abuse and suicidal ideas. The patient is not nervous/anxious.     Past Medical History:  Diagnosis Date  . Anemia   . HIV infection ([place])   . Hyperlipidemia   . Hypertension   . Neuropathy     Social History   Tobacco Use  . Smoking status:  Never Smoker  . Smokeless tobacco: Never Used  Substance Use Topics  . Alcohol use: Yes    Comment: social  . Drug use: No    Family History  Problem Relation Age of Onset  . Heart attack Mother   . Stroke Mother   . Hyperlipidemia Mother   . Cancer Father        Leukemia   . Hyperlipidemia Maternal Grandmother   . Alzheimer's disease Maternal Grandmother   . Heart attack Maternal Grandfather     No Known Allergies  Health Maintenance  Topic Date Due  . INFLUENZA VACCINE  05/23/2017  . PAP  SMEAR  05/03/2020  . TETANUS/TDAP  09/09/2020  . HIV Screening  Completed    Objective:  Vitals:   01/01/18 1014  BP: 117/85  Pulse: (!) 109  Temp: 97.9 F (36.6 C)  TempSrc: Oral  Weight: 112 lb 8 oz (51 kg)  Height: '5\' 1"'  (1.549 m)   Body mass index is 21.26 kg/m.  Physical Exam  Constitutional: She is oriented to person, place, and time.  Her weight is down 5-1/2 pounds since last August.  She is intermittently tearful during the exam.  HENT:  Mouth/Throat: No oropharyngeal exudate.  Eyes: Conjunctivae are normal.  Neck: Neck supple.  Cardiovascular: Normal rate and regular rhythm.  No murmur heard. Pulmonary/Chest: Effort normal and breath sounds normal.  Abdominal: Soft. She exhibits no mass. There is no tenderness.  Musculoskeletal: Normal range of motion.  Neurological: She is alert and oriented to person, place, and time.  Skin: No rash noted.  Psychiatric: Mood and affect normal.    Lab Results Lab Results  Component Value Date   WBC 3.3 (L) 12/30/2017   HGB 10.8 (L) 12/30/2017   HCT 34.4 (L) 12/30/2017   MCV 96.4 12/30/2017   PLT 212 12/30/2017    Lab Results  Component Value Date   CREATININE 0.79 12/30/2017   BUN 15 12/30/2017   NA 137 12/30/2017   K 4.3 12/30/2017   CL 101 12/30/2017   CO2 27 12/30/2017    Lab Results  Component Value Date   ALT 13 05/07/2017   AST 35 05/07/2017   ALKPHOS 77 05/07/2017   BILITOT 0.2 05/07/2017    Lab Results  Component Value Date   CHOL 162 01/30/2017   HDL 25 (L) 01/30/2017   LDLCALC 58 01/30/2017   TRIG 394 (H) 01/30/2017   CHOLHDL 6.5 (H) 01/30/2017   Lab Results  Component Value Date   LABRPR NON REAC 01/30/2017   HIV 1 RNA Quant (copies/mL)  Date Value  05/07/2017 286,000 (H)   CD4 T Cell Abs (/uL)  Date Value  12/30/2017 70 (L)  05/07/2017 190 (L)  01/30/2017 100 (L)     Problem List Items Addressed This Visit      High   HIV disease ([place])    She continues to have extreme  difficulty with adherence.  I talked to her at length today about our need to flip her script about HIV.  She views of the services we provide here and her Biktarvy as reminders of something bad rather than a positive way to deal with HIV.  Because of her recent headache she is more motivated to get back on therapy.  I have refilled her medications.  She met today with our ID pharmacist to review adherence tips.  She will follow-up with pharmacy in 2 weeks and back to see me in 4 weeks.  Relevant Medications   bictegravir-emtricitabine-tenofovir AF (BIKTARVY) 50-200-25 MG TABS tablet   sulfamethoxazole-trimethoprim (BACTRIM DS,SEPTRA DS) 800-160 MG tablet (Start on 01/02/2018)     Unprioritized   Depression    We will arrange for her to come back and meet with our behavioral health counselor.      HTN (hypertension)    Blood pressure is at goal today but was quite elevated on her visit last August and when she was seen in the emergency department 2 days ago.  I will have her restart hydrochlorothiazide.  She is going to make an appointment to follow-up with her primary care provider.      Relevant Medications   hydrochlorothiazide (HYDRODIURIL) 25 MG tablet        Michel Bickers, MD [facility] [place] [phone] pager   [phone] cell 01/01/2018, 11:19 AM

## 2018-01-01 NOTE — Assessment & Plan Note (Signed)
Blood pressure is at goal today but was quite elevated on her visit last August and when she was seen in the emergency department 2 days ago.  I will have her restart hydrochlorothiazide.  She is going to make an appointment to follow-up with her primary care provider.

## 2018-01-17 ENCOUNTER — Ambulatory Visit: Payer: Medicaid Other

## 2018-01-17 ENCOUNTER — Ambulatory Visit: Payer: Medicaid Other | Admitting: *Deleted

## 2018-01-29 ENCOUNTER — Ambulatory Visit: Payer: Medicaid Other | Admitting: Internal Medicine

## 2018-01-29 NOTE — Progress Notes (Signed)
Did not show for Pharmacy visit today. Plan was to do a face to face to offer assistance again during her scheduled visit with pharmacy

## 2018-01-31 ENCOUNTER — Ambulatory Visit: Payer: Medicaid Other

## 2018-05-17 ENCOUNTER — Emergency Department (HOSPITAL_COMMUNITY): Payer: Self-pay

## 2018-05-17 ENCOUNTER — Other Ambulatory Visit: Payer: Self-pay

## 2018-05-17 ENCOUNTER — Encounter (HOSPITAL_COMMUNITY): Payer: Self-pay | Admitting: Emergency Medicine

## 2018-05-17 ENCOUNTER — Emergency Department (HOSPITAL_COMMUNITY)
Admission: EM | Admit: 2018-05-17 | Discharge: 2018-05-17 | Disposition: A | Discharge status: Home / Self Care | Payer: Self-pay | Attending: Emergency Medicine | Admitting: Emergency Medicine

## 2018-05-17 DIAGNOSIS — M549 Dorsalgia, unspecified: Secondary | ICD-10-CM | POA: Insufficient documentation

## 2018-05-17 DIAGNOSIS — I10 Essential (primary) hypertension: Secondary | ICD-10-CM | POA: Insufficient documentation

## 2018-05-17 DIAGNOSIS — M542 Cervicalgia: Secondary | ICD-10-CM | POA: Insufficient documentation

## 2018-05-17 DIAGNOSIS — Z79899 Other long term (current) drug therapy: Secondary | ICD-10-CM | POA: Insufficient documentation

## 2018-05-17 LAB — POC URINE PREG, ED: PREG TEST UR: NEGATIVE

## 2018-05-17 MED ORDER — METHOCARBAMOL 500 MG PO TABS: 500.0000 mg | ORAL_TABLET | Freq: Three times a day (TID) | ORAL | 0 refills | Status: DC | PRN
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
## 2018-05-17 NOTE — ED Notes (Signed)
Pt ready for discharge VS documented 

## 2018-08-26 ENCOUNTER — Other Ambulatory Visit: Payer: Self-pay

## 2018-08-26 ENCOUNTER — Emergency Department (HOSPITAL_COMMUNITY)
Admission: EM | Admit: 2018-08-26 | Discharge: 2018-08-26 | Disposition: A | Discharge status: Home / Self Care | Payer: Medicaid Other | Attending: Emergency Medicine | Admitting: Emergency Medicine

## 2018-08-26 ENCOUNTER — Encounter (HOSPITAL_COMMUNITY): Payer: Self-pay | Admitting: Emergency Medicine

## 2018-08-26 DIAGNOSIS — M545 Low back pain, unspecified: Secondary | ICD-10-CM

## 2018-08-26 DIAGNOSIS — I10 Essential (primary) hypertension: Secondary | ICD-10-CM | POA: Insufficient documentation

## 2018-08-26 DIAGNOSIS — Z79899 Other long term (current) drug therapy: Secondary | ICD-10-CM | POA: Insufficient documentation

## 2018-08-26 DIAGNOSIS — N3000 Acute cystitis without hematuria: Secondary | ICD-10-CM | POA: Insufficient documentation

## 2018-08-26 DIAGNOSIS — B20 Human immunodeficiency virus [HIV] disease: Secondary | ICD-10-CM | POA: Insufficient documentation

## 2018-08-26 LAB — URINALYSIS, ROUTINE W REFLEX MICROSCOPIC
Bilirubin Urine: NEGATIVE
GLUCOSE, UA: NEGATIVE mg/dL
HGB URINE DIPSTICK: NEGATIVE
Ketones, ur: NEGATIVE mg/dL
NITRITE: POSITIVE — AB
PROTEIN: NEGATIVE mg/dL
Specific Gravity, Urine: 1.017 (ref 1.005–1.030)
pH: 6 (ref 5.0–8.0)

## 2018-08-26 MED ORDER — CEPHALEXIN 500 MG PO CAPS: 500.0000 mg | ORAL_CAPSULE | Freq: Four times a day (QID) | ORAL | 0 refills | Status: DC

## 2018-08-26 NOTE — ED Provider Notes (Signed)
[facility] EMERGENCY DEPARTMENT Provider Note   CSN: [id number] Arrival date & time: 08/26/18  0004     History   Chief Complaint Chief Complaint  Patient presents with  . Back Pain    HPI Krystal Bowen is a 47 y.o. female with a hx of anemia, HIV, HTN, neuropathy presents to the Emergency Department complaining of gradual, persistent, progressively worsening low back pain onset 2 weeks ago. Pt reports at the onset she had associated urinary frequency, but this has improved.  Pt reports her low back pain is consistent with previous UTIs.  No treatment PTA.  Nothing makes it better and nothing makes it worse.  Pt denies fever, chills, headache, neck pain, chest pain, shortness of breath, abd pain, dysuria, hematuria, vaginal symptoms.  Pt denies IVDU, anticoagulants.     Record review shows that patient's last office visit with infectious disease was 01/01/2018 with Dr. Orvan Falconer.  That chart notes the patient has had difficulty with compliance with her medications and her current counts as of March 2019 were viral load of 286,000 and CD4 count was 70.  She was started on Bactrim at that time for PCP prophylaxis.  Patient was to have a 2-week follow-up at that time but did not present for her appointment.   The history is provided by the patient and medical records. No language interpreter was used.    Past Medical History:  Diagnosis Date  . Anemia   . HIV infection (HCC)   . Hyperlipidemia   . Hypertension   . Neuropathy     Patient Active Problem List   Diagnosis Date Noted  . Depression 01/01/2018  . Peripheral neuropathy 02/13/2017  . HIV disease (HCC) 02/12/2017  . HTN (hypertension) 02/12/2017  . Normocytic anemia 02/12/2017  . Dyslipidemia 02/12/2017  . History of shingles 02/12/2017    History reviewed. No pertinent surgical history.   OB History   None      Home Medications    Prior to Admission medications   Medication Sig Start Date  End Date Taking? Authorizing Provider  bictegravir-emtricitabine-tenofovir AF (BIKTARVY) 50-200-25 MG TABS tablet Take 1 tablet by mouth daily. 01/01/18   [name], MD  cephALEXin (KEFLEX) 500 MG capsule Take 1 capsule (500 mg total) by mouth 4 (four) times daily. 08/26/18   Tearsa Kowalewski, Dahlia Client, PA-C  gabapentin (NEURONTIN) 600 MG tablet Take 300 mg by mouth 2 (two) times daily.    [provider]  hydrochlorothiazide (HYDRODIURIL) 25 MG tablet Take 1 tablet (25 mg total) by mouth daily. 01/01/18   [name], MD  methocarbamol (ROBAXIN) 500 MG tablet Take 1 tablet (500 mg total) by mouth every 8 (eight) hours as needed. 05/17/18   Petrucelli, Samantha R, PA-C  sulfamethoxazole-trimethoprim (BACTRIM DS,SEPTRA DS) 800-160 MG tablet Take 1 tablet by mouth 3 (three) times a week. 01/02/18   [name], MD    Family History Family History  Problem Relation Age of Onset  . Heart attack Mother   . Stroke Mother   . Hyperlipidemia Mother   . Cancer Father        Leukemia   . Hyperlipidemia Maternal Grandmother   . Alzheimer's disease Maternal Grandmother   . Heart attack Maternal Grandfather     Social History Social History   Tobacco Use  . Smoking status: Never Smoker  . Smokeless tobacco: Never Used  Substance Use Topics  . Alcohol use: Yes    Comment: social  . Drug use: No  Allergies   Patient has no known allergies.   Review of Systems Review of Systems  Constitutional: Negative for appetite change, diaphoresis, fatigue, fever and unexpected weight change.  HENT: Negative for mouth sores.   Eyes: Negative for visual disturbance.  Respiratory: Negative for cough, chest tightness, shortness of breath and wheezing.   Cardiovascular: Negative for chest pain.  Gastrointestinal: Negative for abdominal pain, constipation, diarrhea, nausea and vomiting.  Endocrine: Negative for polydipsia, polyphagia and polyuria.  Genitourinary: Negative for dysuria,  frequency, hematuria and urgency.  Musculoskeletal: Positive for back pain. Negative for neck stiffness.  Skin: Negative for rash.  Allergic/Immunologic: Negative for immunocompromised state.  Neurological: Negative for syncope, light-headedness and headaches.  Hematological: Does not bruise/bleed easily.  Psychiatric/Behavioral: Negative for sleep disturbance. The patient is not nervous/anxious.      Physical Exam Updated Vital Signs BP 119/89 (BP Location: Right Arm)   Pulse 98   Temp 98.9 F (37.2 C) (Oral)   Resp 16   LMP 08/05/2018   SpO2 98%   Physical Exam  Constitutional: She appears well-developed and well-nourished. No distress.  HENT:  Head: Normocephalic and atraumatic.  Mouth/Throat: Oropharynx is clear and moist. No oropharyngeal exudate.  Eyes: Conjunctivae are normal.  Neck: Normal range of motion. Neck supple.  Full ROM without pain  Cardiovascular: Normal rate, regular rhythm and intact distal pulses.  Pulmonary/Chest: Effort normal and breath sounds normal. No respiratory distress. She has no wheezes.  Abdominal: Soft. She exhibits no distension. There is no tenderness. There is no rigidity, no guarding and no CVA tenderness.  Musculoskeletal:  Full range of motion of the T-spine and L-spine No midline tenderness to the  T-spine or L-spine No tenderness to palpation of the paraspinous muscles of the L-spine  Lymphadenopathy:    She has no cervical adenopathy.  Neurological: She is alert.  Speech is clear and goal oriented, follows commands Normal 5/5 strength in upper and lower extremities bilaterally including dorsiflexion and plantar flexion, strong and equal grip strength Sensation normal to light and sharp touch Moves extremities without ataxia, coordination intact Normal gait Normal balance No Clonus  Skin: Skin is warm and dry. No rash noted. She is not diaphoretic. No erythema.  Psychiatric: She has a normal mood and affect. Her behavior is  normal.  Nursing note and vitals reviewed.    ED Treatments / Results  Labs (all labs ordered are listed, but only abnormal results are displayed) Labs Reviewed  URINALYSIS, ROUTINE W REFLEX MICROSCOPIC - Abnormal; Notable for the following components:      Result Value   APPearance HAZY (*)    Nitrite POSITIVE (*)    Leukocytes, UA TRACE (*)    Bacteria, UA MANY (*)    All other components within normal limits  URINE CULTURE     Procedures Procedures (including critical care time)  Medications Ordered in ED Medications - No data to display   Initial Impression / Assessment and Plan / ED Course  I have reviewed the triage vital signs and the nursing notes.  Pertinent labs & imaging results that were available during my care of the patient were reviewed by me and considered in my medical decision making (see chart for details).  Clinical Course as of Aug 26 348  [facility] Aug 26, 2018  1610 Tachycardic on arrival however suspect some component of anxiety.  Pulse Rate(!): 107 [HM]  0347 Patient is afebrile without hypotension or tachycardia.  No evidence of sepsis.  Temp: 98.9 F (  37.2 C) [HM]  0348 UA positive for nitrites and leukocytes with white blood cells and many bacteria.  Nitrite(!): POSITIVE [HM]    Clinical Course User Index [HM] Zulma Court, Dahlia Client, PA-C    Pt has been diagnosed with a UTI. Pt is afebrile, no CVA tenderness, normotensive, and denies N/V.  Patient is immunocompromised but denies IV drug use.  Less likely to be epidural abscess.  No trauma.  Pain is not reproducible on musculoskeletal exam.  Pt to be dc home with antibiotics and instructions to follow up with PCP if symptoms persist.    Patient reports to me that she has been intermittently taking her Biktarvy.  She is not currently taking Bactrim.  She has not followed up with Dr. Orvan Falconer since March.  I strongly encouraged her to take her Biktarvy regularly as directed and follow-up with Dr.  Orvan Falconer soon.  She states understanding.   Final Clinical Impressions(s) / ED Diagnoses   Final diagnoses:  Acute bilateral low back pain without sciatica  Acute cystitis without hematuria    ED Discharge Orders         Ordered    cephALEXin (KEFLEX) 500 MG capsule  4 times daily     08/26/18 0342           Jamareon Shimel, Dahlia Client, PA-C 08/26/18 0349    Ward, Layla Maw, DO 08/26/18 [phone]

## 2018-08-26 NOTE — ED Notes (Signed)
Patient verbalizes understanding of medications and discharge instructions. No further questions at this time. VSS and patient ambulatory at discharge.   

## 2018-08-26 NOTE — Discharge Instructions (Signed)
1. Medications: Keflex, usual home medications 2. Treatment: rest, drink plenty of fluids, take medications as prescribed 3. Follow Up: Please followup with your primary doctor in 3 days for discussion of your diagnoses and further evaluation after today's visit; return to the ER for fevers, persistent vomiting, worsening abdominal pain or other concerning symptoms.  

## 2018-08-26 NOTE — ED Triage Notes (Signed)
C/o lower back pain x 2 weeks.  No known injury.  Denies urinary complaints.

## 2018-08-28 LAB — URINE CULTURE

## 2018-08-29 ENCOUNTER — Telehealth: Payer: Self-pay | Admitting: *Deleted

## 2018-08-29 NOTE — Telephone Encounter (Signed)
Post ED Visit - Positive Culture Follow-up  Culture report reviewed by antimicrobial stewardship pharmacist:  []  Enzo Bi, Pharm.D. []  Celedonio Miyamoto, Pharm.D., BCPS AQ-ID []  Garvin Fila, Pharm.D., BCPS []  Georgina Pillion, Pharm.D., BCPS []  [place], [address].D., BCPS, AAHIVP []  Estella Husk, Pharm.D., BCPS, AAHIVP []  Lysle Pearl, PharmD, BCPS []  Phillips Climes, PharmD, BCPS []  Agapito Games, PharmD, BCPS []  Verlan Friends, PharmD J. Tessin, PharmD  Positive urine culture Treated with Cephalexin, organism sensitive to the same and no further patient follow-up is required at this time.  [facility] 08/29/2018, 9:18 AM

## 2018-10-18 ENCOUNTER — Other Ambulatory Visit: Payer: Self-pay

## 2018-10-18 ENCOUNTER — Encounter (HOSPITAL_COMMUNITY): Payer: Self-pay | Admitting: Internal Medicine

## 2018-10-18 ENCOUNTER — Inpatient Hospital Stay (HOSPITAL_COMMUNITY): Payer: Self-pay

## 2018-10-18 ENCOUNTER — Inpatient Hospital Stay (HOSPITAL_COMMUNITY)
Admission: EM | Admit: 2018-10-18 | Discharge: 2018-10-21 | DRG: 975 | Disposition: A | Discharge status: Home / Self Care | Payer: Self-pay | Attending: Internal Medicine | Admitting: Internal Medicine

## 2018-10-18 DIAGNOSIS — R197 Diarrhea, unspecified: Secondary | ICD-10-CM

## 2018-10-18 DIAGNOSIS — Z823 Family history of stroke: Secondary | ICD-10-CM

## 2018-10-18 DIAGNOSIS — J13 Pneumonia due to Streptococcus pneumoniae: Secondary | ICD-10-CM

## 2018-10-18 DIAGNOSIS — Z8349 Family history of other endocrine, nutritional and metabolic diseases: Secondary | ICD-10-CM

## 2018-10-18 DIAGNOSIS — Z9119 Patient's noncompliance with other medical treatment and regimen: Secondary | ICD-10-CM

## 2018-10-18 DIAGNOSIS — N179 Acute kidney failure, unspecified: Secondary | ICD-10-CM

## 2018-10-18 DIAGNOSIS — A419 Sepsis, unspecified organism: Principal | ICD-10-CM | POA: Diagnosis present

## 2018-10-18 DIAGNOSIS — R05 Cough: Secondary | ICD-10-CM

## 2018-10-18 DIAGNOSIS — Z9114 Patient's other noncompliance with medication regimen: Secondary | ICD-10-CM

## 2018-10-18 DIAGNOSIS — D638 Anemia in other chronic diseases classified elsewhere: Secondary | ICD-10-CM | POA: Diagnosis present

## 2018-10-18 DIAGNOSIS — R7989 Other specified abnormal findings of blood chemistry: Secondary | ICD-10-CM

## 2018-10-18 DIAGNOSIS — B20 Human immunodeficiency virus [HIV] disease: Secondary | ICD-10-CM

## 2018-10-18 DIAGNOSIS — I9589 Other hypotension: Secondary | ICD-10-CM

## 2018-10-18 DIAGNOSIS — I951 Orthostatic hypotension: Secondary | ICD-10-CM

## 2018-10-18 DIAGNOSIS — R059 Cough, unspecified: Secondary | ICD-10-CM

## 2018-10-18 DIAGNOSIS — F329 Major depressive disorder, single episode, unspecified: Secondary | ICD-10-CM | POA: Diagnosis present

## 2018-10-18 DIAGNOSIS — R000 Tachycardia, unspecified: Secondary | ICD-10-CM

## 2018-10-18 DIAGNOSIS — D649 Anemia, unspecified: Secondary | ICD-10-CM

## 2018-10-18 DIAGNOSIS — Z820 Family history of epilepsy and other diseases of the nervous system: Secondary | ICD-10-CM

## 2018-10-18 DIAGNOSIS — G629 Polyneuropathy, unspecified: Secondary | ICD-10-CM | POA: Diagnosis present

## 2018-10-18 DIAGNOSIS — F32A Depression, unspecified: Secondary | ICD-10-CM | POA: Diagnosis present

## 2018-10-18 DIAGNOSIS — E861 Hypovolemia: Secondary | ICD-10-CM | POA: Diagnosis present

## 2018-10-18 DIAGNOSIS — E785 Hyperlipidemia, unspecified: Secondary | ICD-10-CM

## 2018-10-18 DIAGNOSIS — Z8249 Family history of ischemic heart disease and other diseases of the circulatory system: Secondary | ICD-10-CM

## 2018-10-18 DIAGNOSIS — E860 Dehydration: Secondary | ICD-10-CM | POA: Diagnosis present

## 2018-10-18 DIAGNOSIS — I10 Essential (primary) hypertension: Secondary | ICD-10-CM

## 2018-10-18 DIAGNOSIS — I959 Hypotension, unspecified: Secondary | ICD-10-CM | POA: Diagnosis present

## 2018-10-18 LAB — BASIC METABOLIC PANEL
ANION GAP: 18 — AB (ref 5–15)
BUN: 42 mg/dL — ABNORMAL HIGH (ref 6–20)
CHLORIDE: 100 mmol/L (ref 98–111)
CO2: 21 mmol/L — AB (ref 22–32)
Calcium: 8.4 mg/dL — ABNORMAL LOW (ref 8.9–10.3)
Creatinine, Ser: 2.92 mg/dL — ABNORMAL HIGH (ref 0.44–1.00)
GFR calc Af Amer: 21 mL/min — ABNORMAL LOW (ref >60)
GFR calc non Af Amer: 18 mL/min — ABNORMAL LOW (ref >60)
GLUCOSE: 115 mg/dL — AB (ref 70–99)
POTASSIUM: 4 mmol/L (ref 3.5–5.1)
Sodium: 139 mmol/L (ref 135–145)

## 2018-10-18 LAB — CBC
HEMATOCRIT: 33.9 % — AB (ref 36.0–46.0)
Hemoglobin: 10.5 g/dL — ABNORMAL LOW (ref 12.0–15.0)
MCH: 30.1 pg (ref 26.0–34.0)
MCHC: 31 g/dL (ref 30.0–36.0)
MCV: 97.1 fL (ref 80.0–100.0)
Platelets: 165 10*3/uL (ref 150–400)
RBC: 3.49 MIL/uL — AB (ref 3.87–5.11)
RDW: 14.9 % (ref 11.5–15.5)
WBC: 17.4 10*3/uL — ABNORMAL HIGH (ref 4.0–10.5)
nRBC: 0.1 % (ref 0.0–0.2)

## 2018-10-18 LAB — I-STAT BETA HCG BLOOD, ED (MC, WL, AP ONLY): I-stat hCG, quantitative: <5 m[IU]/mL (ref <5)

## 2018-10-18 LAB — LACTIC ACID, PLASMA: Lactic Acid, Venous: 1.6 mmol/L (ref 0.5–1.9)

## 2018-10-18 LAB — I-STAT CG4 LACTIC ACID, ED: Lactic Acid, Venous: 2.33 mmol/L (ref 0.5–1.9)

## 2018-10-18 LAB — STREP PNEUMONIAE URINARY ANTIGEN: STREP PNEUMO URINARY ANTIGEN: POSITIVE — AB

## 2018-10-18 LAB — I-STAT TROPONIN, ED: Troponin i, poc: 0.03 ng/mL (ref 0.00–0.08)

## 2018-10-18 MED ORDER — SODIUM CHLORIDE 0.9 % IV SOLN
2.0000 g | INTRAVENOUS | Status: DC
  Administered 2018-10-19 – 2018-10-21 (×3): 2 g via INTRAVENOUS
  Filled 2018-10-18 (×3): qty 20

## 2018-10-18 MED ORDER — METHOCARBAMOL 500 MG PO TABS
500.0000 mg | ORAL_TABLET | Freq: Three times a day (TID) | ORAL | Status: DC | PRN
  Administered 2018-10-19: 500 mg via ORAL
  Filled 2018-10-18 (×2): qty 1

## 2018-10-18 MED ORDER — SODIUM CHLORIDE 0.9 % IV SOLN
1.0000 g | INTRAVENOUS | Status: DC
  Administered 2018-10-18: 1 g via INTRAVENOUS
  Filled 2018-10-18: qty 10

## 2018-10-18 MED ORDER — SODIUM CHLORIDE 0.9 % IV BOLUS
1000.0000 mL | Freq: Once | INTRAVENOUS | Status: AC
  Administered 2018-10-18: 1000 mL via INTRAVENOUS

## 2018-10-18 MED ORDER — ACETAMINOPHEN 325 MG PO TABS
650.0000 mg | ORAL_TABLET | Freq: Four times a day (QID) | ORAL | Status: DC | PRN
  Administered 2018-10-18 – 2018-10-20 (×3): 650 mg via ORAL
  Filled 2018-10-18 (×4): qty 2

## 2018-10-18 MED ORDER — ACETAMINOPHEN 650 MG RE SUPP: 650.0000 mg | Freq: Four times a day (QID) | RECTAL | Status: DC | PRN

## 2018-10-18 MED ORDER — SODIUM CHLORIDE 0.9 % IV SOLN
INTRAVENOUS | Status: DC
  Administered 2018-10-18 – 2018-10-20 (×4): via INTRAVENOUS

## 2018-10-18 MED ORDER — LOPERAMIDE HCL 2 MG PO CAPS
4.0000 mg | ORAL_CAPSULE | Freq: Once | ORAL | Status: AC
  Administered 2018-10-18: 4 mg via ORAL
  Filled 2018-10-18: qty 2

## 2018-10-18 MED ORDER — SODIUM CHLORIDE 0.9 % IV SOLN
500.0000 mg | INTRAVENOUS | Status: DC
  Administered 2018-10-18 – 2018-10-20 (×3): 500 mg via INTRAVENOUS
  Filled 2018-10-18 (×4): qty 500

## 2018-10-18 MED ORDER — ENOXAPARIN SODIUM 30 MG/0.3ML ~~LOC~~ SOLN
30.0000 mg | SUBCUTANEOUS | Status: DC
  Administered 2018-10-18 – 2018-10-20 (×3): 30 mg via SUBCUTANEOUS
  Filled 2018-10-18 (×3): qty 0.3

## 2018-10-18 MED ORDER — BICTEGRAVIR-EMTRICITAB-TENOFOV 50-200-25 MG PO TABS
1.0000 | ORAL_TABLET | Freq: Every day | ORAL | Status: DC
  Administered 2018-10-18 – 2018-10-21 (×4): 1 via ORAL
  Filled 2018-10-18 (×5): qty 1

## 2018-10-18 MED ORDER — ONDANSETRON HCL 4 MG/2ML IJ SOLN: 4.0000 mg | Freq: Four times a day (QID) | INTRAMUSCULAR | Status: DC | PRN

## 2018-10-18 MED ORDER — ONDANSETRON HCL 4 MG PO TABS: 4.0000 mg | ORAL_TABLET | Freq: Four times a day (QID) | ORAL | Status: DC | PRN

## 2018-10-18 NOTE — Progress Notes (Signed)
Jonah BlueJennifer Yates, MD text paged due to patient's heart rate between 130-150s

## 2018-10-18 NOTE — Consult Note (Signed)
[facility]    Date of Admission:  10/18/2018     Total days of antibiotics 1 Ceftriaxone               Reason for Consult: HIV/AIDS, pneumonia     Referring Provider: Ophelia CharterYates  Primary Care Provider: Hoyt KochYousef, Deema, MD (Inactive)   Assessment: Krystal CascoCarol D Didonato is a 47 y.o. female with pneumonia due to strep pneumo (positive urine antigen). Continue ceftriaxone and follow clinically. She is still tachycardic, febrile (none recorded but clinically appears c/w fevers) and weak. Sudden onset of illness argues against PJP pneumonia and she is non-hypoxic breathing normally on room air. Continue Biktarvy as long as she is in agreement with taking this and continuing this outpatient. Would start bactrim prophylaxis 1 DS tab daily if not already on board. Discussed this briefly today but she is not feeling well at all - will continue to support her and encourage her to resume her medications.   Diarrhea seems to have resolved - would check stool sample as planned if she produces. May consider d/c'ing enteric precautions if she has no watery stools for 24 hours. AKI due more likely to current illness, hypotension and dehydration; was not taking her HAART consistently so unrelated to TAF.   Plan: 1. Continue ceftriaxone (2 gm IV q24) until clinical improvement   2. Bactrim 1 DS tab daily  3. Biktarvy to resume once Creatinine Clearance > 30 if she is in agreement with taking this   4. Follow creatinine trend  5. GI pathogen panel once specimen collected.   Principal Problem:   Pneumonia due to Streptococcus pneumoniae (HCC) Active Problems:   AIDS (acquired immune deficiency syndrome) (HCC)   HIV disease (HCC)   HTN (hypertension)   Dyslipidemia   Depression   Diarrhea   Hypotension   Acute renal failure (ARF) (HCC)   Noncompliance w/medication treatment due to intermit use of medication   . bictegravir-emtricitabine-tenofovir AF  1 tablet Oral Daily  .  enoxaparin (LOVENOX) injection  30 mg Subcutaneous Q24H    HPI: Krystal CascoCarol D Bowen is a 47 y.o. female with HIV disease with AIDS defining condition (CD4 70), HTN and dyslipidemia now hospitalized with fevers and malaise.   She was in her usual state of health up until admission when she suddenly felt ill; cough, fatigue, shaking chills, diarrhea (2-3 watery stools) prior to admission. Has not had night sweats, weight loss or any subacute process leading up to current illness. Her family called the ambulance to get her to the hospital overnight. She has a cough that is "heavy" but not productive. She does not have any chest pain and does not have any shortness of breath at rest. She has not had any further episodes of diarrhea since admission. She has been afebrile on record and no leukocytosis. BP has been low but responded to fluids. Strep Pneumonia urine antigen is positive. She is very fatigued and would very much like to go to sleep. Passing urine normally. Normal appetitie. and stable weight leading up until current illness.   She is a patient of Dr. Blair Dolphinampbell's and has had a lot of personal challenges that have caused a great deal of difficulty taking her Biktarvy regularly. Last seen in March of this year. No current viral load on file but last was over 200,000 (04-2017) and CD4 down to 70 (12-2017). She has not been taking prophylactic bactrim.   Review of Systems:  Review of Systems  Constitutional: Positive for chills and malaise/fatigue. Negative for fever.  HENT: Positive for congestion. Negative for tinnitus.   Eyes: Negative for blurred vision and photophobia.  Respiratory: Positive for cough. Negative for sputum production and shortness of breath.   Cardiovascular: Negative for chest pain, orthopnea and leg swelling.  Gastrointestinal: Positive for diarrhea. Negative for nausea and vomiting.  Genitourinary: Negative for dysuria.  Musculoskeletal: Positive for myalgias.  Skin: Negative for  rash.  Neurological: Negative for headaches.    Past Medical History:  Diagnosis Date  . Anemia   . HIV infection (HCC)   . Hyperlipidemia   . Hypertension   . Neuropathy     Social History   Tobacco Use  . Smoking status: Never Smoker  . Smokeless tobacco: Never Used  Substance Use Topics  . Alcohol use: Not Currently    Comment: social  . Drug use: No    Family History  Problem Relation Age of Onset  . Heart attack Mother   . Stroke Mother   . Hyperlipidemia Mother   . Cancer Father        Leukemia   . Hyperlipidemia Maternal Grandmother   . Alzheimer's disease Maternal Grandmother   . Heart attack Maternal Grandfather    No Known Allergies  OBJECTIVE: Blood pressure (!) 122/93, pulse (!) 131, temperature 98.6 F (37 C), temperature source Oral, resp. rate 20, height 5\' 1"  (1.549 m), weight 51.7 kg, SpO2 97 %.  Physical Exam Vitals signs and nursing note reviewed.  Constitutional:      Appearance: She is ill-appearing.     Comments: Resting in bed, shaking. Appears weak, frail.   HENT:     Nose: Nose normal. No congestion.     Mouth/Throat:     Mouth: No oral lesions.     Dentition: No dental abscesses.     Pharynx: No oropharyngeal exudate.  Eyes:     General: No scleral icterus.    Pupils: Pupils are equal, round, and reactive to light.  Cardiovascular:     Rate and Rhythm: Regular rhythm. Tachycardia present.     Heart sounds: Normal heart sounds.  Pulmonary:     Effort: No respiratory distress.     Comments: Decreased breath sounds on left up to mid-chest. Coarse rhonchi in upper airway that clears with cough. Shallow inspiratory effort.  Abdominal:     General: There is no distension.     Palpations: Abdomen is soft.     Tenderness: There is no abdominal tenderness.  Musculoskeletal: Normal range of motion.        General: No tenderness.  Lymphadenopathy:     Cervical: No cervical adenopathy.  Skin:    General: Skin is warm.      Coloration: Skin is not jaundiced.     Findings: No rash.     Comments: Flushed. Feels febrile.   Neurological:     Mental Status: She is alert and oriented to person, place, and time.     Motor: Weakness (generalized ) present.     Gait: Gait normal.     Lab Results Lab Results  Component Value Date   WBC 17.4 (H) 10/18/2018   HGB 10.5 (L) 10/18/2018   HCT 33.9 (L) 10/18/2018   MCV 97.1 10/18/2018   PLT 165 10/18/2018    Lab Results  Component Value Date   CREATININE 2.92 (H) 10/18/2018   BUN 42 (H) 10/18/2018   NA 139 10/18/2018   K 4.0 10/18/2018  CL 100 10/18/2018   CO2 21 (L) 10/18/2018    Lab Results  Component Value Date   ALT 13 05/07/2017   AST 35 05/07/2017   ALKPHOS 77 05/07/2017   BILITOT 0.2 05/07/2017     Microbiology: No results found for this or any previous visit (from the past 240 hour(s)).  Rexene Alberts, MSN, NP-C [facility] Cell: [phone] Pager: [phone]  10/18/2018 3:36 PM

## 2018-10-18 NOTE — ED Triage Notes (Addendum)
Patient had a BM (diarrhea) and when she stood up post BM, she "passed" out per EMS. Patient denies losing consciousness. Denies CP, SOB, and nausea.

## 2018-10-18 NOTE — H&P (Signed)
History and Physical    CORALYNN NIPPLE XBJ:[id number] DOB: 12-Aug-1971 DOA: 10/18/2018  PCP:  [facility]:  Orvan Falconer - ID Patient coming from: Home - lives with parents and daughter; NOK: Parents, [phone]  Chief Complaint: Diarrhea  HPI: Krystal Bowen is a 47 y.o. female with medical history significant of HIV; HTN; and HLD presenting with diarrhea.  She fell and her dad helped her up and so her mom called the ambulance.  It happened overnight.  She has not been feeling well the last few days, has been sleeping more.  +dry cough, "seasonal."  +diarrhea x2-3, loose and watery stools.  No blood in stools.  +eating/drinking ok, but decreased appetite.  No fever.  She is not taking her HIV meds - it is an emotional reminder of the disease.  She took one pill day before yesterday.   ED Course:  HIV - reasonably well controlled.  Presented with diarrhea and hypotension, BP in 80s.  AKI - creatinine 2.92, baseline 1.  Only had 2 episodes of diarrhea, lactate 2.33, likely related to hypovolemia.    Review of Systems: As per HPI; otherwise review of systems reviewed and negative.   Ambulatory Status:  Ambulates without assistance  Past Medical History:  Diagnosis Date  . Anemia   . HIV infection (HCC)   . Hyperlipidemia   . Hypertension   . Neuropathy     Past Surgical History:  Procedure Laterality Date  . HERNIA REPAIR      Social History   Socioeconomic History  . Marital status: Legally Separated    Spouse name: Not on file  . Number of children: 4  . Years of education: Not on file  . Highest education level: Not on file  Occupational History  . 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 Financial resource strain: Not on file  . Food insecurity:    Worry: Not on file    Inability: Not on file  . Transportation needs:    Medical: Not on file    Non-medical: Not on file  Tobacco Use  . Smoking status:  Never Smoker  . Smokeless tobacco: Never Used  Substance and Sexual Activity  . Alcohol use: Not Currently    Comment: social  . Drug use: No  . Sexual activity: Not Currently    Partners: Male    Birth control/protection: Condom  Lifestyle  . Physical activity:    Days per week: Not on file    Minutes per session: Not on file  . Stress: Not on file  Relationships  . Social connections:    Talks on phone: Not on file    Gets together: Not on file    Attends religious service: Not on file    Active member of club or organization: Not on file    Attends meetings of clubs or organizations: Not on file    Relationship status: Not on file  . Intimate partner violence:    Fear of current or ex partner: Not on file    Emotionally abused: Not on file    Physically abused: Not on file    Forced sexual activity: Not on file  Other Topics Concern  . Not on file  Social History Narrative  . Not on file    No Known Allergies  Family History  Problem Relation Age of Onset  . Heart attack Mother   . Stroke  Mother   . Hyperlipidemia Mother   . Cancer Father        Leukemia   . Hyperlipidemia Maternal Grandmother   . Alzheimer's disease Maternal Grandmother   . Heart attack Maternal Grandfather     Prior to Admission medications   Medication Sig Start Date End Date Taking? Authorizing Provider  bictegravir-emtricitabine-tenofovir AF (BIKTARVY) 50-200-25 MG TABS tablet Take 1 tablet by mouth daily. 01/01/18   [name], MD  cephALEXin (KEFLEX) 500 MG capsule Take 1 capsule (500 mg total) by mouth 4 (four) times daily. 08/26/18   Muthersbaugh, Dahlia Client, PA-C  gabapentin (NEURONTIN) 600 MG tablet Take 300 mg by mouth 2 (two) times daily.    [provider]  hydrochlorothiazide (HYDRODIURIL) 25 MG tablet Take 1 tablet (25 mg total) by mouth daily. 01/01/18   [name], MD  methocarbamol (ROBAXIN) 500 MG tablet Take 1 tablet (500 mg total) by mouth every 8 (eight) hours  as needed. 05/17/18   Petrucelli, Samantha R, PA-C  sulfamethoxazole-trimethoprim (BACTRIM DS,SEPTRA DS) 800-160 MG tablet Take 1 tablet by mouth 3 (three) times a week. 01/02/18   [name], MD    Physical Exam: Vitals:   10/18/18 0615 10/18/18 0630 10/18/18 0745 10/18/18 0830  BP: (!) 76/59 (!) 85/53 105/71 114/76  Pulse:  (!) 123 (!) 125 (!) 125  Resp:   20 20  Temp:      TempSrc:      SpO2:  96% 95% 97%  Weight:      Height:         General:  Appears calm and comfortable and is NAD Eyes:  PERRL, EOMI, normal lids, iris ENT:  grossly normal hearing, lips & tongue, mmm Neck:  no LAD, masses or thyromegaly Cardiovascular:  RR with tachycardia, no m/r/g. No LE edema.  Respiratory:   CTA bilaterally with no wheezes/rales/rhonchi.  Normal respiratory effort.  Persistent but sporadic cough, nonproductive. Abdomen:  soft, NT, ND, NABS Back:   normal alignment, no CVAT Skin:  no rash or induration seen on limited exam Musculoskeletal:  grossly normal tone BUE/BLE, good ROM, no bony abnormality Psychiatric:  depressed mood and affect with mild emotional lability, speech fluent and appropriate, AOx3 Neurologic:  CN 2-12 grossly intact, moves all extremities in coordinated fashion, sensation intact    Radiological Exams on Admission: No results found.  EKG: Independently reviewed.  Sinus tachcardia with rate 138; nonspecific ST changes with no evidence of acute ischemia   Labs on Admission: I have personally reviewed the available labs and imaging studies at the time of the admission.  Pertinent labs:   CO2 21 Glucose 115 BUN 42/Creatinine 2.92/GFR 18; 15/0.79/>60 on 3/10 Troponin 0.03 Lactate 2.33 WBC 17.4 Hgb 10.5 HCG <5 CD4 70 on 3/10 Viral load 286,000 on 05/07/17 UTI 11/4 - ?treated with abx  Assessment/Plan Principal Problem:   Acute renal failure (ARF) (HCC) Active Problems:   HIV disease (HCC)   HTN (hypertension)   Dyslipidemia   Depression    Diarrhea   Hypotension   Noncompliance w/medication treatment due to intermit use of medication   Cough   Acute renal failure due to hypovolemia with resultant hypotension -Patient is a poor historian and so it is not clear exactly why she was hypotensive but appears to be associated with prerenal azotemia -She does report diarrhea but only a few loose stools - since she has had recent antibiotics and has uncontrolled HIV, perhaps it was more significant volume loss than she is reporting -  She also appears to have depression and acknowledges poor PO intake -Will admit, rehydrate, and follow  Diarrhea -As noted above, she reports only 2-3 loose stools but apparently had fecal incontinence overnight that awoke her from sleep -Will place on contact precautions and check C diff and also GI pathogen panel -As per Dr. Drue Second, will order empiric Rocephin for now  Cough -Concern for PJP infection given her uncontrolled HIV -PA/Lat CXR ordered -Empiric Rocephin as above -She is not a good candidate for Bactrim at this time due to renal failure  HIV infection with medication noncompliance -Patient is supposed to be taking Biktarvy -However, as per her last note with Dr. Orvan Falconer and also our discussion today, she opts not to take it because it reminds her that she is infected (suspect she had an unfortunate encounter that led to her infection) -Regardless, I talked with her today about the fact that patients who take their medications can actually suppress the disease and essentially cause it to go into remission -By not taking her medications, she is very likely to have severe long-term effects -She voices understanding and agreement  Depression -Consider inpatient vs. Outpatient psych referral -She denies needing acute BH assistance at this time  HTN -Hold HCTZ in the setting of hypotension and renal failure -Can add prn hydralazine if her BP rebounds  HLD -Not taking medication for this  issue at this time -Outpatient f/u is reasonable      DVT prophylaxis: Lovenox  Code Status:  Full - confirmed with patient Family Communication: None present Disposition Plan:  Home once clinically improved Consults called: ID  Admission status: Admit - It is my clinical opinion that admission to INPATIENT is reasonable and necessary because of the expectation that this patient will require hospital care that crosses at least 2 midnights to treat this condition based on the medical complexity of the problems presented.  Given the aforementioned information, the predictability of an adverse outcome is felt to be significant.     Jonah Blue MD [facility]  If note is complete, please contact covering daytime or nighttime physician. [email or web address] Password [email or web address]  10/18/2018, 9:08 AM

## 2018-10-18 NOTE — ED Provider Notes (Signed)
[facility] EMERGENCY DEPARTMENT Provider Note   CSN: [id number] Arrival date & time: 10/18/18  0542     History   Chief Complaint Chief Complaint  Patient presents with  . Near Syncope    HPI Krystal Bowen is a 47 y.o. female.  The history is provided by the patient.  She has history of hypertension, hyperlipidemia, HIV disease and comes in because of diarrhea.  She states that she had 2 episodes of diarrhea during the night.  She had an episode where she fell to the floor.  She is not sure if she passed out or not and she does not remember if she was dizzy or not.  She denies abdominal pain, nausea, vomiting.  She denies fever or chills.  She denies any sick contacts.  She had been on a course of antibiotics in the last several months, but she does not remember details.  Past Medical History:  Diagnosis Date  . Anemia   . HIV infection (HCC)   . Hyperlipidemia   . Hypertension   . Neuropathy     Patient Active Problem List   Diagnosis Date Noted  . Depression 01/01/2018  . Peripheral neuropathy 02/13/2017  . HIV disease (HCC) 02/12/2017  . HTN (hypertension) 02/12/2017  . Normocytic anemia 02/12/2017  . Dyslipidemia 02/12/2017  . History of shingles 02/12/2017    No past surgical history on file.   OB History   No obstetric history on file.      Home Medications    Prior to Admission medications   Medication Sig Start Date End Date Taking? Authorizing Provider  bictegravir-emtricitabine-tenofovir AF (BIKTARVY) 50-200-25 MG TABS tablet Take 1 tablet by mouth daily. 01/01/18   [name], MD  cephALEXin (KEFLEX) 500 MG capsule Take 1 capsule (500 mg total) by mouth 4 (four) times daily. 08/26/18   Muthersbaugh, Dahlia Client, PA-C  gabapentin (NEURONTIN) 600 MG tablet Take 300 mg by mouth 2 (two) times daily.    [provider]  hydrochlorothiazide (HYDRODIURIL) 25 MG tablet Take 1 tablet (25 mg total) by mouth daily. 01/01/18    [name], MD  methocarbamol (ROBAXIN) 500 MG tablet Take 1 tablet (500 mg total) by mouth every 8 (eight) hours as needed. 05/17/18   Petrucelli, Samantha R, PA-C  sulfamethoxazole-trimethoprim (BACTRIM DS,SEPTRA DS) 800-160 MG tablet Take 1 tablet by mouth 3 (three) times a week. 01/02/18   [name], MD    Family History Family History  Problem Relation Age of Onset  . Heart attack Mother   . Stroke Mother   . Hyperlipidemia Mother   . Cancer Father        Leukemia   . Hyperlipidemia Maternal Grandmother   . Alzheimer's disease Maternal Grandmother   . Heart attack Maternal Grandfather     Social History Social History   Tobacco Use  . Smoking status: Never Smoker  . Smokeless tobacco: Never Used  Substance Use Topics  . Alcohol use: Yes    Comment: social  . Drug use: No     Allergies   Patient has no known allergies.   Review of Systems Review of Systems  All other systems reviewed and are negative.    Physical Exam Updated Vital Signs BP (!) 85/53   Pulse (!) 123   Temp 98.7 F (37.1 C) (Oral)   Resp (!) 23   Ht 5\' 1"  (1.549 m)   Wt 51.7 kg   SpO2 96%   BMI 21.54 kg/m  Physical Exam Vitals signs and nursing note reviewed.    47 year old female, resting comfortably and in no acute distress. Vital signs are significant for rapid heart rate and respiratory rate, low blood pressure. Oxygen saturation is 96%, which is normal. Head is normocephalic and atraumatic. PERRLA, EOMI. Oropharynx is clear. Neck is nontender and supple without adenopathy or JVD. Back is nontender and there is no CVA tenderness. Lungs are clear without rales, wheezes, or rhonchi. Chest is nontender. Heart has regular rate and rhythm without murmur. Abdomen is soft, flat, nontender without masses or hepatosplenomegaly and peristalsis is hypoactive. Extremities have no cyanosis or edema, full range of motion is present. Skin is warm and dry without rash. Neurologic:  Mental status is normal, cranial nerves are intact, there are no motor or sensory deficits.  ED Treatments / Results  Labs (all labs ordered are listed, but only abnormal results are displayed) Labs Reviewed  BASIC METABOLIC PANEL - Abnormal; Notable for the following components:      Result Value   CO2 21 (*)    Glucose, Bld 115 (*)    BUN 42 (*)    Creatinine, Ser 2.92 (*)    Calcium 8.4 (*)    GFR calc non Af Amer 18 (*)    GFR calc Af Amer 21 (*)    Anion gap 18 (*)    All other components within normal limits  CBC - Abnormal; Notable for the following components:   WBC 17.4 (*)    RBC 3.49 (*)    Hemoglobin 10.5 (*)    HCT 33.9 (*)    All other components within normal limits  I-STAT CG4 LACTIC ACID, ED - Abnormal; Notable for the following components:   Lactic Acid, Venous 2.33 (*)    All other components within normal limits  C DIFFICILE QUICK SCREEN W PCR REFLEX  URINALYSIS, ROUTINE W REFLEX MICROSCOPIC  I-STAT BETA HCG BLOOD, ED (MC, WL, AP ONLY)  I-STAT TROPONIN, ED  I-STAT CG4 LACTIC ACID, ED    EKG EKG Interpretation  Date/Time:  Friday October 18 2018 05:47:36 EST Ventricular Rate:  138 PR Interval:    QRS Duration: 77 QT Interval:  323 QTC Calculation: 490 R Axis:   71 Text Interpretation:  Sinus tachycardia Borderline T abnormalities, diffuse leads Borderline prolonged QT interval When compared with ECG of 12/30/2017, HEART RATE has increased Confirmed by Dione BoozeGlick, Bekim Werntz ([id number]) on 10/18/2018 6:04:41 AM   Radiology No results found.  Procedures Procedures  CRITICAL CARE Performed by: Dione Boozeavid Sabryna Lahm Total critical care time: 60 minutes Critical care time was exclusive of separately billable procedures and treating other patients. Critical care was necessary to treat or prevent imminent or life-threatening deterioration. Critical care was time spent personally by me on the following activities: development of treatment plan with patient and/or  surrogate as well as nursing, discussions with consultants, evaluation of patient's response to treatment, examination of patient, obtaining history from patient or surrogate, ordering and performing treatments and interventions, ordering and review of laboratory studies, ordering and review of radiographic studies, pulse oximetry and re-evaluation of patient's condition.  Medications Ordered in ED Medications  sodium chloride 0.9 % bolus 1,000 mL (1,000 mLs Intravenous New Bag/Given 10/18/18 0819)  sodium chloride 0.9 % bolus 1,000 mL (0 mLs Intravenous Stopped 10/18/18 0754)  loperamide (IMODIUM) capsule 4 mg (4 mg Oral Given 10/18/18 [id number])     Initial Impression / Assessment and Plan / ED Course  I have reviewed the triage  vital signs and the nursing notes.  Pertinent lab results that were available during my care of the patient were reviewed by me and considered in my medical decision making (see chart for details).  Hypotension and tachycardia consistent with dehydration.  Apparently, this is from diarrhea.  This is somewhat more than I would expect from only 2 episodes of diarrhea.  She had received 1L IV fluids prior to my seeing her, and blood pressure has come up but heart rate is still elevated.  She is given additional IV fluids.  CBC shows moderate elevation of WBC consistent with a stress reaction, mild anemia which is unchanged from baseline.  Troponin is normal.  Metabolic panel is pending.  ECG shows tachycardia, no other acute changes.  Last HIV studies were done in 2018.  She was seen in the ED in early November and prescribed cephalexin.  If she has more diarrhea, will send sample for evaluation for C. Difficile.  In the meantime, she is given additional IV fluids and will check lactic acid level.  Lactic acid level has come back mildly elevated.  This is felt to be due to hypovolemia and not sepsis.  Blood pressure is come up with IV fluids, but she continues to be tachycardic in  spite of 2 L of IV fluid.  She is given additional IV fluid.  Laboratory work-up shows evidence of acute kidney injury.  Mild anemia is present which is unchanged from baseline.  Case is discussed with Dr. Ophelia CharterYates of [facility], who agrees to admit the patient.  Final Clinical Impressions(s) / ED Diagnoses   Final diagnoses:  Acute renal failure, unspecified acute renal failure type (HCC)  Hypotension due to hypovolemia  Diarrhea of presumed infectious origin  Elevated lactic acid level  Normochromic normocytic anemia  HIV disease [facility](HCC)    ED Discharge Orders    None       Dione BoozeGlick, Shanae Luo, MD 10/18/18 [phone]

## 2018-10-19 DIAGNOSIS — D649 Anemia, unspecified: Secondary | ICD-10-CM

## 2018-10-19 LAB — GASTROINTESTINAL PANEL BY PCR, STOOL (REPLACES STOOL CULTURE)

## 2018-10-19 LAB — BASIC METABOLIC PANEL
Anion gap: 9 (ref 5–15)
BUN: 36 mg/dL — ABNORMAL HIGH (ref 6–20)
CO2: 19 mmol/L — ABNORMAL LOW (ref 22–32)
CREATININE: 1.66 mg/dL — AB (ref 0.44–1.00)
Calcium: 6.5 mg/dL — ABNORMAL LOW (ref 8.9–10.3)
Chloride: 114 mmol/L — ABNORMAL HIGH (ref 98–111)
GFR calc Af Amer: 42 mL/min — ABNORMAL LOW (ref >60)
GFR calc non Af Amer: 36 mL/min — ABNORMAL LOW (ref >60)
Glucose, Bld: 90 mg/dL (ref 70–99)
Potassium: 3.7 mmol/L (ref 3.5–5.1)
Sodium: 142 mmol/L (ref 135–145)

## 2018-10-19 LAB — CBC
HCT: 30.5 % — ABNORMAL LOW (ref 36.0–46.0)
Hemoglobin: 9.3 g/dL — ABNORMAL LOW (ref 12.0–15.0)
MCH: 29.6 pg (ref 26.0–34.0)
MCHC: 30.5 g/dL (ref 30.0–36.0)
MCV: 97.1 fL (ref 80.0–100.0)
Platelets: 118 10*3/uL — ABNORMAL LOW (ref 150–400)
RBC: 3.14 MIL/uL — ABNORMAL LOW (ref 3.87–5.11)
RDW: 15.3 % (ref 11.5–15.5)
WBC: 10.8 10*3/uL — ABNORMAL HIGH (ref 4.0–10.5)
nRBC: 0 % (ref 0.0–0.2)

## 2018-10-19 LAB — C DIFFICILE QUICK SCREEN W PCR REFLEX
C Diff antigen: NEGATIVE
C Diff interpretation: NOT DETECTED
C Diff toxin: NEGATIVE

## 2018-10-19 MED ORDER — GUAIFENESIN ER 600 MG PO TB12
600.0000 mg | ORAL_TABLET | Freq: Two times a day (BID) | ORAL | Status: DC
  Administered 2018-10-19 (×2): 600 mg via ORAL
  Filled 2018-10-19 (×2): qty 1

## 2018-10-19 MED ORDER — GUAIFENESIN 100 MG/5ML PO SOLN
10.0000 mL | ORAL | Status: DC | PRN
  Administered 2018-10-21: 200 mg via ORAL
  Filled 2018-10-19 (×3): qty 10

## 2018-10-19 NOTE — Progress Notes (Signed)
Provided the patient with mouth care kit and moisturizer

## 2018-10-19 NOTE — Progress Notes (Addendum)
[facility] PROGRESS NOTE  Krystal Bowen ZOX:[id number] DOB: September 05, 1971 DOA: 10/18/2018  PCP: Hoyt Koch, MD (Inactive)  Brief History/Interval Summary: 47 y.o. female with medical history significant of HIV; HTN; and HLD presented with diarrhea.    Patient had not been feeling well few days prior to admission.  She also had a cough.  Patient was found to be febrile with a low blood pressures.  She was found to have acute renal failure.  She was hospitalized for further management.    Reason for Visit: Acute renal failure.  Acute diarrhea.  Community-acquired pneumonia.  Consultants: Infectious disease  Procedures: None  Antibiotics: Ceftriaxone and azithromycin  Subjective/Interval History: Patient states that she has not had any further episodes of diarrhea.  She continues to have a cough without any expectoration.  Some shortness of breath.  No chest pain.  No abdominal pain.  ROS: Denies any headaches  Objective:  Vital Signs  Vitals:   10/18/18 2118 10/18/18 2222 10/19/18 0013 10/19/18 0504  BP:   94/77 111/84  Pulse:   (!) 101 95  Resp:      Temp: (!) 101 F (38.3 C) 98.6 F (37 C) 98.8 F (37.1 C) 97.6 F (36.4 C)  TempSrc: Oral Oral Oral Axillary  SpO2:   100%   Weight:      Height:        Intake/Output Summary (Last 24 hours) at 10/19/2018 1105 Last data filed at 10/19/2018 0600 Gross per 24 hour  Intake 1778.85 ml  Output -  Net 1778.85 ml   Filed Weights   10/18/18 0550  Weight: 51.7 kg    General appearance: alert, cooperative, appears stated age and no distress Head: Normocephalic, without obvious abnormality, atraumatic Resp: Few crackles at the bases.  No rhonchi or wheezing.  Normal effort at rest. Cardio: regular rate and rhythm, S1, S2 normal, no murmur, click, rub or gallop GI: soft, non-tender; bowel sounds normal; no masses,  no organomegaly Extremities: extremities normal, atraumatic, no cyanosis or edema Pulses: 2+ and  symmetric Neurologic: Alert and oriented x3.  No focal neurological deficits.  Lab Results:  Data Reviewed: I have personally reviewed following labs and imaging studies  CBC: Recent Labs  Lab 10/18/18 0555 10/19/18 0400  WBC 17.4* 10.8*  HGB 10.5* 9.3*  HCT 33.9* 30.5*  MCV 97.1 97.1  PLT 165 118*    Basic Metabolic Panel: Recent Labs  Lab 10/18/18 0555 10/19/18 0400  NA 139 142  K 4.0 3.7  CL 100 114*  CO2 21* 19*  GLUCOSE 115* 90  BUN 42* 36*  CREATININE 2.92* 1.66*  CALCIUM 8.4* 6.5*    GFR: Estimated Creatinine Clearance: 31.6 mL/min (A) (by C-G formula based on SCr of 1.66 mg/dL (H)).   Recent Results (from the past 240 hour(s))  Culture, blood (routine x 2)     Status: None (Preliminary result)   Collection Time: 10/18/18 12:57 PM  Result Value Ref Range Status   Specimen Description BLOOD RIGHT ANTECUBITAL  Final   Special Requests   Final    BOTTLES DRAWN AEROBIC AND ANAEROBIC Blood Culture adequate volume   Culture   Final    NO GROWTH < 24 HOURS Performed at [facility], [address] [place], [place] [address]    Report Status PENDING  Incomplete  Culture, blood (routine x 2)     Status: None (Preliminary result)   Collection Time: 10/18/18 12:57 PM  Result Value Ref Range Status  Specimen Description BLOOD BLOOD RIGHT HAND  Final   Special Requests   Final    BOTTLES DRAWN AEROBIC AND ANAEROBIC Blood Culture results may not be optimal due to an inadequate volume of blood received in culture bottles   Culture   Final    NO GROWTH < 24 HOURS Performed at [facility], [address], [place], [place] [address]    Report Status PENDING  Incomplete  C difficile quick scan w PCR reflex     Status: None   Collection Time: 10/19/18  5:29 AM  Result Value Ref Range Status   C Diff antigen NEGATIVE NEGATIVE Final   C Diff toxin NEGATIVE NEGATIVE Final   C Diff interpretation No C. difficile detected.  Final    Comment: Performed  at [facility], [address], [place], [place] [address]      Radiology Studies: X-ray Chest Pa And Lateral  Result Date: 10/18/2018 CLINICAL DATA:  Syncopal episode today. EXAM: CHEST - 2 VIEW COMPARISON:  Plain films thoracic spine 05/20/2018. FINDINGS: There is left lower lobe airspace disease and left effusion. The right lung is clear. No pneumothorax. Heart size is normal. No bony abnormality. IMPRESSION: Left lower lobe airspace disease and small effusion most worrisome for pneumonia. Electronically Signed   By: Drusilla Kanner M.D.   On: 10/18/2018 09:31     Medications:  Scheduled: . bictegravir-emtricitabine-tenofovir AF  1 tablet Oral Daily  . enoxaparin (LOVENOX) injection  30 mg Subcutaneous Q24H  . guaiFENesin  600 mg Oral BID   Continuous: . sodium chloride 125 mL/hr at 10/19/18 0600  . azithromycin Stopped (10/18/18 2005)  . cefTRIAXone (ROCEPHIN)  IV 2 g (10/19/18 1034)   BMW:[id number] **OR** acetaminophen, methocarbamol, ondansetron **OR** ondansetron (ZOFRAN) IV    Assessment/Plan:  Acute renal failure This was most likely due to hypovolemia.  This is likely secondary to diarrhea.  Patient was given IV fluids with improvement in renal function.  Continue fluids for today.  Decrease the rate.  Monitor urine output.  Acute diarrhea Etiology unclear.  C. difficile negative.  GI pathogen panel pending.  Community-acquired pneumonia with sepsis/strep pneumo Strep pneumo urinary antigen positive. Patient on ceftriaxone and azithromycin.  Mucinex.  Flutter valve.  WBC has improved.  She did have fever overnight.  Await cultures.  Patient had sepsis on presentation with fever tachycardia tachypnea elevated WBC.  Sepsis physiology has improved.  Lactic acid level improved.  History of HIV She has medication noncompliance.  Supposed to be taking Biktarvy.  ID is following.  Defer management to them.  CD4 was 70 in March.  Depression Likely brought on by  her acute medical condition as well as chronic medical problems.  Denies any suicidal ideation.  Continue to monitor for now.  Essential hypertension with hypotension at presentation Holding HCTZ.  Blood pressure was low at presentation with systolics in the 80s.  Improved with IV fluids.  She was also tachycardic which is improved.  Hyperlipidemia Currently not taking medication.  Can be followed in the outpatient setting.  Normocytic anemia Most likely due to chronic disease.  Drop in hemoglobin is likely dilutional.   DVT Prophylaxis: Lovenox    Code Status: Full code Family Communication: Discussed with the patient Disposition Plan: Management as outlined above     LOS: 1 day   Osvaldo Shipper  [facility] Pager [phone] 10/19/2018, 11:05 AM  If 7PM-7AM, please contact night-coverage at [email or web address], password [facility]

## 2018-10-19 NOTE — Progress Notes (Signed)
The patient is requesting most medications for liquid form or IV because she states that she has to have them with chocolate pudding and the unit does not have at this time.  Chocolate pudding has been ordered from dietary department

## 2018-10-19 NOTE — Progress Notes (Signed)
Incentive spirometer provided with teaching

## 2018-10-20 LAB — BASIC METABOLIC PANEL
Anion gap: 9 (ref 5–15)
BUN: 13 mg/dL (ref 6–20)
CO2: 21 mmol/L — ABNORMAL LOW (ref 22–32)
CREATININE: 1.15 mg/dL — AB (ref 0.44–1.00)
Calcium: 7 mg/dL — ABNORMAL LOW (ref 8.9–10.3)
Chloride: 112 mmol/L — ABNORMAL HIGH (ref 98–111)
GFR calc Af Amer: >60 mL/min (ref >60)
GFR calc non Af Amer: 57 mL/min — ABNORMAL LOW (ref >60)
Glucose, Bld: 101 mg/dL — ABNORMAL HIGH (ref 70–99)
Potassium: 3.4 mmol/L — ABNORMAL LOW (ref 3.5–5.1)
Sodium: 142 mmol/L (ref 135–145)

## 2018-10-20 LAB — CBC
HCT: 30.3 % — ABNORMAL LOW (ref 36.0–46.0)
Hemoglobin: 9.4 g/dL — ABNORMAL LOW (ref 12.0–15.0)
MCH: 30.3 pg (ref 26.0–34.0)
MCHC: 31 g/dL (ref 30.0–36.0)
MCV: 97.7 fL (ref 80.0–100.0)
Platelets: 137 10*3/uL — ABNORMAL LOW (ref 150–400)
RBC: 3.1 MIL/uL — ABNORMAL LOW (ref 3.87–5.11)
RDW: 15.9 % — AB (ref 11.5–15.5)
WBC: 8.2 10*3/uL (ref 4.0–10.5)
nRBC: 0 % (ref 0.0–0.2)

## 2018-10-20 LAB — LEGIONELLA PNEUMOPHILA SEROGP 1 UR AG: L. pneumophila Serogp 1 Ur Ag: NEGATIVE

## 2018-10-20 MED ORDER — ENOXAPARIN SODIUM 40 MG/0.4ML ~~LOC~~ SOLN
40.0000 mg | SUBCUTANEOUS | Status: DC
  Administered 2018-10-21: 40 mg via SUBCUTANEOUS
  Filled 2018-10-20: qty 0.4

## 2018-10-20 MED ORDER — POTASSIUM CHLORIDE CRYS ER 20 MEQ PO TBCR
20.0000 meq | EXTENDED_RELEASE_TABLET | Freq: Once | ORAL | Status: AC
  Administered 2018-10-20: 20 meq via ORAL
  Filled 2018-10-20: qty 1

## 2018-10-20 NOTE — Progress Notes (Signed)
[facility] PROGRESS NOTE  Krystal CascoCarol D Santerre WUJ:[id number] DOB: [birth year] DOA: 10/18/2018  PCP: Hoyt KochYousef, Deema, MD (Inactive)  Brief History/Interval Summary: 47 y.o. female with medical history significant of HIV; HTN; and HLD presented with diarrhea.    Patient had not been feeling well few days prior to admission.  She also had a cough.  Patient was found to be febrile with a low blood pressures.  She was found to have acute renal failure.  She was hospitalized for further management.    Reason for Visit: Acute renal failure.  Acute diarrhea.  Community-acquired pneumonia.  Consultants: Infectious disease  Procedures: None  Antibiotics: Ceftriaxone and azithromycin  Subjective/Interval History: Patient states that she continues to have cough.  Denies any chest pain.  Had a small bowel movement overnight but no diarrhea.  Denies any abdominal pain.  Denies any difficulty swallowing.  She is always had some difficulty swallowing tablets.  ROS: Denies any headaches  Objective:  Vital Signs  Vitals:   10/19/18 0504 10/19/18 1311 10/19/18 2041 10/20/18 0358  BP: 111/84 (!) 131/119 (!) 135/92 (!) 138/101  Pulse: 95 89 79 86  Resp:  20    Temp: 97.6 F (36.4 C) 98.5 F (36.9 C) (!) 97.5 F (36.4 C) (!) 97.5 F (36.4 C)  TempSrc: Axillary Oral Oral Oral  SpO2:  98% 100% 100%  Weight:      Height:        Intake/Output Summary (Last 24 hours) at 10/20/2018 0857 Last data filed at 10/20/2018 0300 Gross per 24 hour  Intake 830 ml  Output -  Net 830 ml   Filed Weights   10/18/18 0550  Weight: 51.7 kg    General appearance: Awake alert.  In no distress No lesions noted in the oral cavity Resp: Normal effort at rest.  Crackles at the bases.  No rhonchi or wheezing. Cardio: S1-S2 is normal regular.  No S3-S4.  No rubs murmurs or bruit GI: Abdomen is soft.  Nontender nondistended.  No masses organomegaly Extremities: No edema Neurologic: Alert and oriented x3.   No focal neurological deficits  Lab Results:  Data Reviewed: I have personally reviewed following labs and imaging studies  CBC: Recent Labs  Lab 10/18/18 0555 10/19/18 0400  WBC 17.4* 10.8*  HGB 10.5* 9.3*  HCT 33.9* 30.5*  MCV 97.1 97.1  PLT 165 118*    Basic Metabolic Panel: Recent Labs  Lab 10/18/18 0555 10/19/18 0400  NA 139 142  K 4.0 3.7  CL 100 114*  CO2 21* 19*  GLUCOSE 115* 90  BUN 42* 36*  CREATININE 2.92* 1.66*  CALCIUM 8.4* 6.5*    GFR: Estimated Creatinine Clearance: 31.6 mL/min (A) (by C-G formula based on SCr of 1.66 mg/dL (H)).   Recent Results (from the past 240 hour(s))  Culture, blood (routine x 2)     Status: None (Preliminary result)   Collection Time: 10/18/18 12:57 PM  Result Value Ref Range Status   Specimen Description BLOOD RIGHT ANTECUBITAL  Final   Special Requests   Final    BOTTLES DRAWN AEROBIC AND ANAEROBIC Blood Culture adequate volume   Culture   Final    NO GROWTH < 24 HOURS Performed at [facility], [address] [place], [place] [address]    Report Status PENDING  Incomplete  Culture, blood (routine x 2)     Status: None (Preliminary result)   Collection Time: 10/18/18 12:57 PM  Result Value Ref Range Status  Specimen Description BLOOD BLOOD RIGHT HAND  Final   Special Requests   Final    BOTTLES DRAWN AEROBIC AND ANAEROBIC Blood Culture results may not be optimal due to an inadequate volume of blood received in culture bottles   Culture   Final    NO GROWTH < 24 HOURS Performed at [facility], [address] [place], [place] [address]    Report Status PENDING  Incomplete  C difficile quick scan w PCR reflex     Status: None   Collection Time: 10/19/18  5:29 AM  Result Value Ref Range Status   C Diff antigen NEGATIVE NEGATIVE Final   C Diff toxin NEGATIVE NEGATIVE Final   C Diff interpretation No C. difficile detected.  Final    Comment: Performed at [facility], [address]  [place], [place] [address]  Gastrointestinal Panel by PCR , Stool     Status: None   Collection Time: 10/19/18  5:33 AM  Result Value Ref Range Status   Campylobacter species NOT DETECTED NOT DETECTED Final   Plesimonas shigelloides NOT DETECTED NOT DETECTED Final   Salmonella species NOT DETECTED NOT DETECTED Final   Yersinia enterocolitica NOT DETECTED NOT DETECTED Final   Vibrio species NOT DETECTED NOT DETECTED Final   Vibrio cholerae NOT DETECTED NOT DETECTED Final   Enteroaggregative E coli (EAEC) NOT DETECTED NOT DETECTED Final   Enteropathogenic E coli (EPEC) NOT DETECTED NOT DETECTED Final   Enterotoxigenic E coli (ETEC) NOT DETECTED NOT DETECTED Final   Shiga like toxin producing E coli (STEC) NOT DETECTED NOT DETECTED Final   Shigella/Enteroinvasive E coli (EIEC) NOT DETECTED NOT DETECTED Final   Cryptosporidium NOT DETECTED NOT DETECTED Final   Cyclospora cayetanensis NOT DETECTED NOT DETECTED Final   Entamoeba histolytica NOT DETECTED NOT DETECTED Final   Giardia lamblia NOT DETECTED NOT DETECTED Final   Adenovirus F40/41 NOT DETECTED NOT DETECTED Final   Astrovirus NOT DETECTED NOT DETECTED Final   Norovirus GI/GII NOT DETECTED NOT DETECTED Final   Rotavirus A NOT DETECTED NOT DETECTED Final   Sapovirus (I, II, IV, and V) NOT DETECTED NOT DETECTED Final    Comment: Performed at [facility], [address] [place], [place] [address]      Radiology Studies: X-ray Chest Pa And Lateral  Result Date: 10/18/2018 CLINICAL DATA:  Syncopal episode today. EXAM: CHEST - 2 VIEW COMPARISON:  Plain films thoracic spine 05/20/2018. FINDINGS: There is left lower lobe airspace disease and left effusion. The right lung is clear. No pneumothorax. Heart size is normal. No bony abnormality. IMPRESSION: Left lower lobe airspace disease and small effusion most worrisome for pneumonia. Electronically Signed   By: Drusilla Kannerhomas  Dalessio M.D.   On: 10/18/2018 09:31     Medications:    Scheduled: . bictegravir-emtricitabine-tenofovir AF  1 tablet Oral Daily  . enoxaparin (LOVENOX) injection  30 mg Subcutaneous Q24H   Continuous: . sodium chloride 75 mL/hr at 10/19/18 1330  . azithromycin Stopped (10/19/18 2233)  . cefTRIAXone (ROCEPHIN)  IV Stopped (10/19/18 1108)   BJY:[id number]:acetaminophen **OR** acetaminophen, guaiFENesin, methocarbamol, ondansetron **OR** ondansetron (ZOFRAN) IV    Assessment/Plan:  Acute renal failure This was most likely due to hypovolemia patient given IV fluids.  Creatinine has improved to 1.15.  Potassium noted to be slightly low this morning which will be supplemented.  Cut back on IV fluids.  Continue oral hydration.  Mobilize.  Acute diarrhea Possibly viral.  Appears to have resolved.  C.  difficile was negative.  GI pathogen panel also unremarkable.  Community-acquired pneumonia with sepsis/strep pneumo Strep pneumo urinary antigen positive. Patient on ceftriaxone and azithromycin.  Mucinex.  Flutter valve.  BBC is now normal.  Cultures negative so far.  Patient had sepsis on presentation with fever tachycardia tachypnea elevated WBC.  Sepsis physiology has improved.  Lactic acid level improved.  Consider transition to oral antibiotics tomorrow.  History of HIV She has medication noncompliance.  Supposed to be taking Biktarvy.  ID is following.  Defer management to them.  CD4 was 70 in March.  Plan is to resume her medication once renal function has improved.  Depression Likely brought on by her acute medical condition as well as chronic medical problems.  Patient denies needing any help with this at this time.  She denies any suicidal or homicidal ideation.  Essential hypertension with hypotension at presentation Holding HCTZ.  Blood pressure was low at presentation with systolics in the 80s.  Improved with IV fluids.  She was also tachycardic which is improved.  Hyperlipidemia Currently not taking medication.  Can be followed in the  outpatient setting.  Normocytic anemia Most likely due to chronic disease.  Drop in hemoglobin is likely dilutional.  Stable this morning.   DVT Prophylaxis: Lovenox    Code Status: Full code Family Communication: Discussed with the patient Disposition Plan: Mobilize.  Continue management as outlined above.  Replace potassium.  Hopefully discharge tomorrow.     LOS: 2 days   Osvaldo Shipper  [facility] Pager [phone] 10/20/2018, 8:57 AM  If 7PM-7AM, please contact night-coverage at [email or web address], password [facility]

## 2018-10-20 NOTE — Progress Notes (Signed)
ID PROGRESS NOTE  47yo F with hiv disease, poorly controlled, presents with CAP. Found to have strep pneumo ur ag+ Improved on ceftriaxone plus azithromycin  CAP = treat for a total of 5 days. Can switch to oral cephalosporin, such as cefuroxime 500mg  BID for discharge. Can ask pharmacy to dose ceftriaxone early and give prior to discharge so that only needs 1 addn day of oral abtx  HIV disease = continue on biktarvy to take daily  OI proph = bactrim DS daily(dispense 30), azithromycin 600mg  -take 2 tabs weekly ( dispense 12 tabs)  We will see back in clinic in 1-2 wk   Lizania Bouchard B. Drue SecondSnider MD MPH [facility] [phone]

## 2018-10-21 LAB — CBC
HCT: 29.4 % — ABNORMAL LOW (ref 36.0–46.0)
Hemoglobin: 9.1 g/dL — ABNORMAL LOW (ref 12.0–15.0)
MCH: 29.6 pg (ref 26.0–34.0)
MCHC: 31 g/dL (ref 30.0–36.0)
MCV: 95.8 fL (ref 80.0–100.0)
PLATELETS: 143 10*3/uL — AB (ref 150–400)
RBC: 3.07 MIL/uL — ABNORMAL LOW (ref 3.87–5.11)
RDW: 15.6 % — ABNORMAL HIGH (ref 11.5–15.5)
WBC: 5.2 10*3/uL (ref 4.0–10.5)
nRBC: 0 % (ref 0.0–0.2)

## 2018-10-21 LAB — BASIC METABOLIC PANEL
Anion gap: 8 (ref 5–15)
BUN: 9 mg/dL (ref 6–20)
CO2: 20 mmol/L — ABNORMAL LOW (ref 22–32)
Calcium: 7.2 mg/dL — ABNORMAL LOW (ref 8.9–10.3)
Chloride: 113 mmol/L — ABNORMAL HIGH (ref 98–111)
Creatinine, Ser: 0.88 mg/dL (ref 0.44–1.00)
GFR calc Af Amer: >60 mL/min (ref >60)
Glucose, Bld: 83 mg/dL (ref 70–99)
Potassium: 3.6 mmol/L (ref 3.5–5.1)
Sodium: 141 mmol/L (ref 135–145)

## 2018-10-21 LAB — T-HELPER CELLS (CD4) COUNT (NOT AT ARMC)
CD4 T CELL ABS: 10 /uL — AB (ref 400–2700)
CD4 T CELL HELPER: 3 % — AB (ref 33–55)

## 2018-10-21 MED ORDER — SULFAMETHOXAZOLE-TRIMETHOPRIM 800-160 MG PO TABS: 1.0000 | ORAL_TABLET | Freq: Every day | ORAL | 0 refills | Status: AC

## 2018-10-21 MED ORDER — AZITHROMYCIN 600 MG PO TABS: 1200.0000 mg | ORAL_TABLET | ORAL | 0 refills | Status: DC

## 2018-10-21 MED ORDER — SULFAMETHOXAZOLE-TRIMETHOPRIM 800-160 MG PO TABS: 1.0000 | ORAL_TABLET | Freq: Every day | ORAL | 0 refills | Status: DC

## 2018-10-21 MED ORDER — CEFUROXIME AXETIL 500 MG PO TABS: 500.0000 mg | ORAL_TABLET | Freq: Two times a day (BID) | ORAL | 0 refills | Status: DC

## 2018-10-21 MED ORDER — AZITHROMYCIN 500 MG PO TABS
500.0000 mg | ORAL_TABLET | Freq: Every day | ORAL | Status: DC
  Filled 2018-10-21: qty 1

## 2018-10-21 MED ORDER — CEFUROXIME AXETIL 500 MG PO TABS: 500.0000 mg | ORAL_TABLET | Freq: Two times a day (BID) | ORAL | 0 refills | Status: AC

## 2018-10-21 MED FILL — SULFAMETHOXAZOLE-TMP DS TAB: 800-160 | 30 days supply | Qty: 30 | Fill #0

## 2018-10-21 MED FILL — CEFUROXIME AXETIL 500 MG TA: 500 | 2 days supply | Qty: 4 | Fill #0

## 2018-10-21 MED FILL — AZITHROMYCIN 600 MG TABLET: 600 | 12 days supply | Qty: 12 | Fill #0

## 2018-10-21 NOTE — Progress Notes (Signed)
Pt would like to receive her HCTZ that she takes at home.

## 2018-10-21 NOTE — Plan of Care (Signed)
  Problem: Skin Integrity: Goal: Risk for impaired skin integrity will decrease Outcome: Progressing   

## 2018-10-21 NOTE — Discharge Instructions (Signed)
Community-Acquired Pneumonia, Adult  Pneumonia is an infection of the lungs. It causes swelling in the airways of the lungs. Mucus and fluid may also build up inside the airways.  One type of pneumonia can happen while a person is in a hospital. A different type can happen when a person is not in a hospital (community-acquired pneumonia).   What are the causes?    This condition is caused by germs (viruses, bacteria, or fungi). Some types of germs can be passed from one person to another. This can happen when you breathe in droplets from the cough or sneeze of an infected person.  What increases the risk?  You are more likely to develop this condition if you:   Have a long-term (chronic) disease, such as:  ? Chronic obstructive pulmonary disease (COPD).  ? Asthma.  ? Cystic fibrosis.  ? Congestive heart failure.  ? Diabetes.  ? Kidney disease.   Have HIV.   Have sickle cell disease.   Have had your spleen removed.   Do not take good care of your teeth and mouth (poor dental hygiene).   Have a medical condition that increases the risk of breathing in droplets from your own mouth and nose.   Have a weakened body defense system (immune system).   Are a smoker.   Travel to areas where the germs that cause this illness are common.   Are around certain animals or the places they live.  What are the signs or symptoms?   A dry cough.   A wet (productive) cough.   Fever.   Sweating.   Chest pain. This often happens when breathing deeply or coughing.   Fast breathing or trouble breathing.   Shortness of breath.   Shaking chills.   Feeling tired (fatigue).   Muscle aches.  How is this treated?  Treatment for this condition depends on many things. Most adults can be treated at home. In some cases, treatment must happen in a hospital. Treatment may include:   Medicines given by mouth or through an IV tube.   Being given extra oxygen.   Respiratory therapy.  In rare cases, treatment for very bad pneumonia  may include:   Using a machine to help you breathe.   Having a procedure to remove fluid from around your lungs.  Follow these instructions at home:  Medicines   Take over-the-counter and prescription medicines only as told by your doctor.  ? Only take cough medicine if you are losing sleep.   If you were prescribed an antibiotic medicine, take it as told by your doctor. Do not stop taking the antibiotic even if you start to feel better.  General instructions     Sleep with your head and neck raised (elevated). You can do this by sleeping in a recliner or by putting a few pillows under your head.   Rest as needed. Get at least 8 hours of sleep each night.   Drink enough water to keep your pee (urine) pale yellow.   Eat a healthy diet that includes plenty of vegetables, fruits, whole grains, low-fat dairy products, and lean protein.   Do not use any products that contain nicotine or tobacco. These include cigarettes, e-cigarettes, and chewing tobacco. If you need help quitting, ask your doctor.   Keep all follow-up visits as told by your doctor. This is important.  How is this prevented?  A shot (vaccine) can help prevent pneumonia. Shots are often suggested for:   People   older than 47 years of age.   People older than 47 years of age who:  ? Are having cancer treatment.  ? Have long-term (chronic) lung disease.  ? Have problems with their body's defense system.  You may also prevent pneumonia if you take these actions:   Get the flu (influenza) shot every year.   Go to the dentist as often as told.   Wash your hands often. If you cannot use soap and water, use hand sanitizer.  Contact a doctor if:   You have a fever.   You lose sleep because your cough medicine does not help.  Get help right away if:   You are short of breath and it gets worse.   You have more chest pain.   Your sickness gets worse. This is very serious if:  ? You are an older adult.  ? Your body's defense system is weak.   You  cough up blood.  Summary   Pneumonia is an infection of the lungs.   Most adults can be treated at home. Some will need treatment in a hospital.   Drink enough water to keep your pee pale yellow.   Get at least 8 hours of sleep each night.  This information is not intended to replace advice given to you by your health care provider. Make sure you discuss any questions you have with your health care provider.  Document Released: 03/27/2008 Document Revised: 06/06/2018 Document Reviewed: 06/06/2018  [employer] Interactive Patient Education  2019 [employer]

## 2018-10-21 NOTE — Discharge Summary (Signed)
[facility]  Physician Discharge Summary   Patient ID: Krystal CascoCarol D Kainz MRN: [id number] DOB/AGE: January 29, 1971 47 y.o.  Admit date: 10/18/2018 Discharge date: 10/21/2018  PCP: Hoyt KochYousef, Deema, MD (Inactive)  DISCHARGE DIAGNOSES:  Community-acquired pneumonia HIV Acute renal failure, resolved Acute diarrhea, resolved History of essential hypertension Normocytic anemia   RECOMMENDATIONS FOR OUTPATIENT FOLLOW UP: 1. Outpatient follow-up with PCP within 1 week 2. ID clinic to arrange outpatient follow-up   DISCHARGE CONDITION: fair  Diet recommendation: As before  [facility]   10/18/18 0550  Weight: 51.7 kg    INITIAL HISTORY: 47 y.o.femalewith medical history significant ofHIV; HTN; and HLD presented with diarrhea.  Patient had not been feeling well few days prior to admission.  She also had a cough.  Patient was found to be febrile with a low blood pressures.  She was found to have acute renal failure.  She was hospitalized for further management.    Consultations:  Infectious disease  Procedures:  None   HOSPITAL COURSE:   Acute renal failure This was most likely due to hypovolemia likely secondary to diarrhea.  Patient was given IV fluids with improvement in renal function.    Acute diarrhea Etiology unclear.  C. difficile negative.  GI pathogen panel unremarkable.  Diarrhea has resolved..  Community-acquired pneumonia with sepsis/strep pneumo Strep pneumo urinary antigen positive. Patient was started on ceftriaxone and azithromycin.  Mucinex.  Flutter valve.  WBC has improved.    Cultures negative.  Patient had sepsis on presentation with fever tachycardia tachypnea elevated WBC.  Sepsis physiology has improved.  Lactic acid level improved.  Patient transitioned to cefuroxime per ID recommendations.  History of HIV She has medication noncompliance.  Supposed to be taking Biktarvy. CD4 was 70 in March.  Outpatient follow-up with  ID.  Depression Likely brought on by her acute medical condition as well as chronic medical problems.  Denies any suicidal ideation.    Patient does not want to see psychiatry at this time.  Essential hypertension with hypotension at presentation Blood pressure initially low.  Medications were held.  Improved now.  Okay to resume at discharge.  Hyperlipidemia Currently not taking medication.  Can be followed in the outpatient setting.  Normocytic anemia Most likely due to chronic disease.  Drop in hemoglobin is likely dilutional.  Overall stable.  Okay for discharge home today.     PERTINENT LABS:  The results of significant diagnostics from this hospitalization (including imaging, microbiology, ancillary and laboratory) are listed below for reference.    Microbiology: Recent Results (from the past 240 hour(s))  Culture, blood (routine x 2)     Status: None (Preliminary result)   Collection Time: 10/18/18 12:57 PM  Result Value Ref Range Status   Specimen Description BLOOD RIGHT ANTECUBITAL  Final   Special Requests   Final    BOTTLES DRAWN AEROBIC AND ANAEROBIC Blood Culture adequate volume   Culture   Final    NO GROWTH 2 DAYS Performed at [facility], [address] [place], [place] [address]    Report Status PENDING  Incomplete  Culture, blood (routine x 2)     Status: None (Preliminary result)   Collection Time: 10/18/18 12:57 PM  Result Value Ref Range Status   Specimen Description BLOOD BLOOD RIGHT HAND  Final   Special Requests   Final    BOTTLES DRAWN AEROBIC AND ANAEROBIC Blood Culture results may not be optimal due to an inadequate volume of blood received in culture bottles  Culture   Final    NO GROWTH 2 DAYS Performed at [facility], [address] [place], [place] [address]    Report Status PENDING  Incomplete  C difficile quick scan w PCR reflex     Status: None   Collection Time: 10/19/18  5:29 AM  Result Value Ref Range Status    C Diff antigen NEGATIVE NEGATIVE Final   C Diff toxin NEGATIVE NEGATIVE Final   C Diff interpretation No C. difficile detected.  Final    Comment: Performed at [facility], [address], [place], [place] [address]  Gastrointestinal Panel by PCR , Stool     Status: None   Collection Time: 10/19/18  5:33 AM  Result Value Ref Range Status   Campylobacter species NOT DETECTED NOT DETECTED Final   Plesimonas shigelloides NOT DETECTED NOT DETECTED Final   Salmonella species NOT DETECTED NOT DETECTED Final   Yersinia enterocolitica NOT DETECTED NOT DETECTED Final   Vibrio species NOT DETECTED NOT DETECTED Final   Vibrio cholerae NOT DETECTED NOT DETECTED Final   Enteroaggregative E coli (EAEC) NOT DETECTED NOT DETECTED Final   Enteropathogenic E coli (EPEC) NOT DETECTED NOT DETECTED Final   Enterotoxigenic E coli (ETEC) NOT DETECTED NOT DETECTED Final   Shiga like toxin producing E coli (STEC) NOT DETECTED NOT DETECTED Final   Shigella/Enteroinvasive E coli (EIEC) NOT DETECTED NOT DETECTED Final   Cryptosporidium NOT DETECTED NOT DETECTED Final   Cyclospora cayetanensis NOT DETECTED NOT DETECTED Final   Entamoeba histolytica NOT DETECTED NOT DETECTED Final   Giardia lamblia NOT DETECTED NOT DETECTED Final   Adenovirus F40/41 NOT DETECTED NOT DETECTED Final   Astrovirus NOT DETECTED NOT DETECTED Final   Norovirus GI/GII NOT DETECTED NOT DETECTED Final   Rotavirus A NOT DETECTED NOT DETECTED Final   Sapovirus (I, II, IV, and V) NOT DETECTED NOT DETECTED Final    Comment: Performed at [facility], [address] [place], [place] [address]     Labs: Basic Metabolic Panel: Recent Labs  Lab 10/18/18 0555 10/19/18 0400 10/20/18 0846 10/21/18 0317  NA 139 142 142 141  K 4.0 3.7 3.4* 3.6  CL 100 114* 112* 113*  CO2 21* 19* 21* 20*  GLUCOSE 115* 90 101* 83  BUN 42* 36* 13 9  CREATININE 2.92* 1.66* 1.15* 0.88  CALCIUM 8.4* 6.5* 7.0* 7.2*   CBC: Recent Labs  Lab  10/18/18 0555 10/19/18 0400 10/20/18 0846 10/21/18 0317  WBC 17.4* 10.8* 8.2 5.2  HGB 10.5* 9.3* 9.4* 9.1*  HCT 33.9* 30.5* 30.3* 29.4*  MCV 97.1 97.1 97.7 95.8  PLT 165 118* 137* 143*     IMAGING STUDIES X-ray Chest Pa And Lateral  Result Date: 10/18/2018 CLINICAL DATA:  Syncopal episode today. EXAM: CHEST - 2 VIEW COMPARISON:  Plain films thoracic spine 05/20/2018. FINDINGS: There is left lower lobe airspace disease and left effusion. The right lung is clear. No pneumothorax. Heart size is normal. No bony abnormality. IMPRESSION: Left lower lobe airspace disease and small effusion most worrisome for pneumonia. Electronically Signed   By: Drusilla Kanner M.D.   On: 10/18/2018 09:31    DISCHARGE EXAMINATION: Vitals:   10/20/18 1618 10/20/18 2027 10/20/18 2100 10/21/18 0650  BP: (!) 139/92 (!) 140/97  (!) 144/99  Pulse:  80  72  Resp:      Temp:   97.7 F (36.5 C) 98 F (36.7 C)  TempSrc:   Oral Oral  SpO2:  99%  98%  Weight:      Height:       General appearance: alert, cooperative, appears stated age and no distress Resp: Normal effort at rest.  Improved aeration bilaterally.  No wheezing or rhonchi.  Few crackles at the bases. Cardio: regular rate and rhythm, S1, S2 normal, no murmur, click, rub or gallop GI: soft, non-tender; bowel sounds normal; no masses,  no organomegaly  DISPOSITION: Home  Discharge Instructions    Call MD for:  difficulty breathing, headache or visual disturbances   Complete by:  As directed    Call MD for:  extreme fatigue   Complete by:  As directed    Call MD for:  persistant dizziness or light-headedness   Complete by:  As directed    Call MD for:  persistant nausea and vomiting   Complete by:  As directed    Call MD for:  temperature >100.4   Complete by:  As directed    Diet - low sodium heart healthy   Complete by:  As directed    Discharge instructions   Complete by:  As directed    Please be sure to follow-up with your PCP  within 1 week.  Take your medications as prescribed.  You were cared for by a hospitalist during your hospital stay. If you have any questions about your discharge medications or the care you received while you were in the hospital after you are discharged, you can call the unit and asked to speak with the hospitalist on call if the hospitalist that took care of you is not available. Once you are discharged, your primary care physician will handle any further medical issues. Please note that NO REFILLS for any discharge medications will be authorized once you are discharged, as it is imperative that you return to your primary care physician (or establish a relationship with a primary care physician if you do not have one) for your aftercare needs so that they can reassess your need for medications and monitor your lab values. If you do not have a primary care physician, you can call [phone] for a physician referral.   Increase activity slowly   Complete by:  As directed         Allergies as of 10/21/2018   No Known Allergies     Medication List    TAKE these medications   azithromycin 600 MG tablet Commonly known as:  ZITHROMAX Take 2 tablets (1,200 mg total) by mouth once a week. Start taking on:  October 23, 2018   bictegravir-emtricitabine-tenofovir AF 50-200-25 MG Tabs tablet Commonly known as:  BIKTARVY Take 1 tablet by mouth daily.   cefUROXime 500 MG tablet Commonly known as:  CEFTIN Take 1 tablet (500 mg total) by mouth 2 (two) times daily with a meal for 2 days. Start taking on:  October 22, 2018   gabapentin 600 MG tablet Commonly known as:  NEURONTIN Take 300 mg by mouth as needed (pain).   hydrochlorothiazide 25 MG tablet Commonly known as:  HYDRODIURIL Take 1 tablet (25 mg total) by mouth daily.   methocarbamol 500 MG tablet Commonly known as:  ROBAXIN Take 1 tablet (500 mg total) by mouth every 8 (eight) hours as needed. What changed:  reasons to take this     sulfamethoxazole-trimethoprim 800-160 MG tablet Commonly known as:  BACTRIM DS,SEPTRA DS Take 1 tablet by mouth daily.        Follow-up Information    Cliffton Astersampbell, [name], MD  Follow up.   Specialty:  Infectious Diseases Why:  His office will schedule a follow-up appointment. Contact information: [address] [place] [address] [phone]        Hoyt Koch, MD. Schedule an appointment as soon as possible for a visit in 1 week(s).   Specialty:  Family Medicine Contact information: [address] [place] [address] [place] [address] [phone]           TOTAL DISCHARGE TIME: 35 minutes  Osvaldo Shipper  [facility] Pager [phone]  10/21/2018, 1:07 PM

## 2018-10-22 ENCOUNTER — Telehealth: Payer: Self-pay | Admitting: *Deleted

## 2018-10-22 NOTE — Telephone Encounter (Signed)
Per Drue SecondSnider called patient to schedule an office visit. Patient scheduled 11/19/18 at 1115 with Orvan Falconerampbell.

## 2018-10-22 NOTE — Telephone Encounter (Signed)
-----   Message from Judyann Munsonynthia Snider, MD sent at 10/20/2018  4:25 PM EST ----- Can Krystal Bowen or Krystal Bowen see back in 1-2 wk. [name]'s b20 recently admitted for CAP

## 2018-10-23 LAB — CULTURE, BLOOD (ROUTINE X 2)
Culture: NO GROWTH
Culture: NO GROWTH
Special Requests: ADEQUATE

## 2018-11-13 LAB — HIV GENOSURE(R) MG

## 2018-11-13 LAB — HIV-1 RNA, PCR (GRAPH) RFX/GENO EDI
HIV-1 RNA BY PCR: 694000 copies/mL
HIV-1 RNA Quant, Log: 5.841 log10copy/mL

## 2018-11-13 LAB — REFLEX TO GENOSURE(R) MG EDI: HIV GenoSure(R): 1

## 2018-11-19 ENCOUNTER — Ambulatory Visit: Payer: Medicaid Other | Admitting: Internal Medicine

## 2018-11-25 ENCOUNTER — Ambulatory Visit: Payer: Medicaid Other

## 2018-11-25 ENCOUNTER — Ambulatory Visit: Payer: Medicaid Other | Admitting: Internal Medicine

## 2019-01-06 ENCOUNTER — Other Ambulatory Visit: Payer: Self-pay | Admitting: Internal Medicine

## 2019-01-06 ENCOUNTER — Other Ambulatory Visit: Payer: Self-pay | Admitting: *Deleted

## 2019-01-06 DIAGNOSIS — B20 Human immunodeficiency virus [HIV] disease: Secondary | ICD-10-CM

## 2019-01-07 ENCOUNTER — Other Ambulatory Visit: Payer: Self-pay | Admitting: Internal Medicine

## 2019-01-09 ENCOUNTER — Encounter: Payer: Self-pay | Admitting: Internal Medicine

## 2019-01-09 ENCOUNTER — Other Ambulatory Visit: Payer: Self-pay

## 2019-01-09 ENCOUNTER — Ambulatory Visit (INDEPENDENT_AMBULATORY_CARE_PROVIDER_SITE_OTHER): Payer: Medicaid Other | Admitting: Internal Medicine

## 2019-01-09 ENCOUNTER — Other Ambulatory Visit: Payer: Medicaid Other

## 2019-01-09 ENCOUNTER — Telehealth: Payer: Self-pay | Admitting: Pharmacy Technician

## 2019-01-09 VITALS — BP 125/86 | HR 106 | Temp 98.5°F | Wt 102.0 lb

## 2019-01-09 DIAGNOSIS — B370 Candidal stomatitis: Secondary | ICD-10-CM | POA: Insufficient documentation

## 2019-01-09 DIAGNOSIS — B20 Human immunodeficiency virus [HIV] disease: Secondary | ICD-10-CM

## 2019-01-09 DIAGNOSIS — B3781 Candidal esophagitis: Secondary | ICD-10-CM

## 2019-01-09 DIAGNOSIS — Z9189 Other specified personal risk factors, not elsewhere classified: Secondary | ICD-10-CM

## 2019-01-09 MED ORDER — FLUCONAZOLE 200 MG PO TABS: 200.0000 mg | ORAL_TABLET | Freq: Every day | ORAL | 1 refills | Status: DC

## 2019-01-09 NOTE — Telephone Encounter (Addendum)
[facility] Patient Advocate Encounter  Completed and sent [employer] Advancing Access application for Biktarvy for this patient who is uninsured.    Patient is approved for 1 year (also applied for HMAP) through 01/09/2020  BIN      [id number]   PCN    [id number] GRP    [id number] ID        [id number]   Krystal Bowen CPhT Specialty Pharmacy Patient [facility] for Infectious Disease Phone: [phone] Fax:  [phone]

## 2019-01-10 ENCOUNTER — Other Ambulatory Visit: Payer: Self-pay | Admitting: Internal Medicine

## 2019-01-10 DIAGNOSIS — Z9189 Other specified personal risk factors, not elsewhere classified: Secondary | ICD-10-CM | POA: Insufficient documentation

## 2019-01-10 DIAGNOSIS — B20 Human immunodeficiency virus [HIV] disease: Secondary | ICD-10-CM

## 2019-01-10 LAB — T-HELPER CELL (CD4) - (RCID CLINIC ONLY)
CD4 % Helper T Cell: 2 % — ABNORMAL LOW (ref 33–55)
CD4 T Cell Abs: 6 /uL — ABNORMAL LOW (ref 400–2700)

## 2019-01-10 MED ORDER — SULFAMETHOXAZOLE-TRIMETHOPRIM 800-160 MG PO TABS: 1.0000 | ORAL_TABLET | Freq: Every day | ORAL | 5 refills | Status: DC

## 2019-01-10 NOTE — Assessment & Plan Note (Signed)
I will send in Bactrim

## 2019-01-10 NOTE — Assessment & Plan Note (Signed)
I will give her fluconazole for 2 weeks

## 2019-01-10 NOTE — Progress Notes (Signed)
   Subjective:    Patient ID: Krystal Bowen, female    DOB: [birth year], 48 y.o.   MRN: [id number]  HPI She is here for a work in visit complaining about thrush.  She has a history of HIV and poor compliance and recently hospitalized for pneumonia in December.  She admits now to taking her medications sporadically.  She is on Biktarvy and came in for labs today but has had worsening thrush.  She had been using nystatin but had no refills.  Her last CD4 count was 10. She was supposed to be taking Bactrim prophylaxis as well but has not been on it.  She has some difficulty swallowing now though is able to take her pills fine.  She admits to understanding the need to take her medication and states she has difficulty taking it since it reminds her of having HIV, which she has stated before.  No significant weight loss.  No diarrhea.     Review of Systems  Constitutional: Negative for fever.  HENT: Positive for sore throat.   Respiratory: Negative for cough and shortness of breath.   Gastrointestinal: Negative for abdominal distention, constipation and diarrhea.  Skin: Negative for rash.  Psychiatric/Behavioral: The patient is not nervous/anxious.        Objective:   Physical Exam Constitutional:      General: She is not in acute distress. HENT:     Mouth/Throat:     Comments: + thrush Eyes:     General: No scleral icterus. Cardiovascular:     Rate and Rhythm: Normal rate and regular rhythm.     Heart sounds: No murmur.  Pulmonary:     Effort: Pulmonary effort is normal.     Breath sounds: Normal breath sounds.  Skin:    Findings: No rash.  Neurological:     Mental Status: She is alert.  Psychiatric:        Mood and Affect: Mood normal.   SH: no tobacco        Assessment & Plan:

## 2019-01-10 NOTE — Assessment & Plan Note (Addendum)
Worsened, symptomatic. Poor compliance and discussed.  CD4 is 6 from this visit.

## 2019-01-14 LAB — CBC WITH DIFFERENTIAL/PLATELET
Absolute Monocytes: 697 cells/uL (ref 200–950)
Basophils Absolute: 8 cells/uL (ref 0–200)
Basophils Relative: 0.2 %
Eosinophils Absolute: 21 cells/uL (ref 15–500)
Eosinophils Relative: 0.5 %
HCT: 31 % — ABNORMAL LOW (ref 35.0–45.0)
Hemoglobin: 10.3 g/dL — ABNORMAL LOW (ref 11.7–15.5)
Lymphs Abs: 294 cells/uL — ABNORMAL LOW (ref 850–3900)
MCH: 30.4 pg (ref 27.0–33.0)
MCHC: 33.2 g/dL (ref 32.0–36.0)
MCV: 91.4 fL (ref 80.0–100.0)
MPV: 12.5 fL (ref 7.5–12.5)
Monocytes Relative: 16.6 %
NEUTROS PCT: 75.7 %
Neutro Abs: 3179 cells/uL (ref 1500–7800)
Platelets: 213 10*3/uL (ref 140–400)
RBC: 3.39 10*6/uL — ABNORMAL LOW (ref 3.80–5.10)
RDW: 13.6 % (ref 11.0–15.0)
Total Lymphocyte: 7 %
WBC: 4.2 10*3/uL (ref 3.8–10.8)

## 2019-01-14 LAB — COMPLETE METABOLIC PANEL WITH GFR
AG Ratio: 0.8 (calc) — ABNORMAL LOW (ref 1.0–2.5)
ALT: 8 U/L (ref 6–29)
AST: 24 U/L (ref 10–35)
Albumin: 3.9 g/dL (ref 3.6–5.1)
Alkaline phosphatase (APISO): 80 U/L (ref 31–125)
BUN/Creatinine Ratio: 23 (calc) — ABNORMAL HIGH (ref 6–22)
BUN: 36 mg/dL — ABNORMAL HIGH (ref 7–25)
CO2: 27 mmol/L (ref 20–32)
Calcium: 9.4 mg/dL (ref 8.6–10.2)
Chloride: 100 mmol/L (ref 98–110)
Creat: 1.54 mg/dL — ABNORMAL HIGH (ref 0.50–1.10)
GFR, Est African American: 46 mL/min/{1.73_m2} — ABNORMAL LOW (ref >=60)
GFR, Est Non African American: 40 mL/min/{1.73_m2} — ABNORMAL LOW (ref >=60)
GLUCOSE: 95 mg/dL (ref 65–99)
Globulin: 5.1 g/dL (calc) — ABNORMAL HIGH (ref 1.9–3.7)
POTASSIUM: 3.5 mmol/L (ref 3.5–5.3)
Sodium: 139 mmol/L (ref 135–146)
TOTAL PROTEIN: 9 g/dL — AB (ref 6.1–8.1)
Total Bilirubin: 0.3 mg/dL (ref 0.2–1.2)

## 2019-01-14 LAB — HIV-1 RNA QUANT-NO REFLEX-BLD
HIV 1 RNA Quant: 238000 copies/mL — ABNORMAL HIGH
HIV-1 RNA Quant, Log: 5.38 Log copies/mL — ABNORMAL HIGH

## 2019-01-20 NOTE — Progress Notes (Signed)
Name: Krystal Bowen  DOB: 1971/04/02 MRN: [id number] PCP: Hoyt Koch, MD (Inactive)    Patient Active Problem List   Diagnosis Date Noted  . AIDS (acquired immune deficiency syndrome) (HCC) 10/18/2018    Priority: High  . Vulvar lesion 01/22/2019  . At risk for opportunistic infections 01/10/2019  . Diarrhea 10/18/2018  . Noncompliance w/medication treatment due to intermit use of medication 10/18/2018  . HIV infection (HCC)   . Tachycardia   . Depression 01/01/2018  . Peripheral neuropathy 02/13/2017  . HIV disease (HCC) 02/12/2017  . HTN (hypertension) 02/12/2017  . Normocytic anemia 02/12/2017  . Dyslipidemia 02/12/2017  . History of shingles 02/12/2017     Brief Narrative:  Krystal Bowen is a 48 y.o. female with HIV disease, Dx 2011, +AIDS status with history of poor control and sporadic medication use.  History OI: oral candidiasis, pneumonia HIV Risk: heterosexual 2018-HepBsAg (-); HepBsAb (+); cAb (-); Hep C (-); Hep A (-); quantiferon (-)  Previous Regimens: . Tivicay + Descovy 01/2017 . Biktarvy  Genotypes: Marland Kitchen   Subjective:  CC: Genital lesion.  HPI: Krystal Bowen was last seen by Dr. Luciana Axe a few weeks ago for acute oral thrush. Was prescribed fluconazole and bactrim for OI proph. Hospitalized back in December for pneumonia (+urine strep pneumo Ag).   Pap smear 04/2017: normal cytology, no HPV  She requested acute office visit today to be evaluated for a lesion that has come up on her vaginal area.  She reports to have had a similar but less severe "bump" about several years ago.  It sounds like that this lesion in the past was swabbed and did not come back positive for bacteria or viruses.  That spot had eventually resolved on its own.  She says she has a history of genital herpes in the past but has not had an outbreak in 3 years.  She tells me that this new spot has been present for about a week now.  There has been associated clear discharge, pain, and itching.   She does not report any fevers or any regional lymphadenopathy.  No odor.  No urinary or GYN symptoms otherwise.  She is not sexually active and has not been for several years.  She cannot understand why this is going on.    She completed the course of fluconazole that Dr. Luciana Axe gave her with resolution of her oral thrush.  She has not picked up her Bactrim and has not been taking her Biktarvy.  She tells me when she does take her Biktarvy she needs to crush it due to "diffuclty swallowing" but later says she does fine swallowing her valtrex.  Constant reminder that she has HIV when she looks down at that pill every day and cannot bring herself to take it.  She is weak, shaky, and very nervous.   Review of Systems  Constitutional: Positive for malaise/fatigue and weight loss. Negative for chills and fever.  Respiratory: Negative for cough and shortness of breath.   Gastrointestinal: Negative for abdominal pain, diarrhea and vomiting.  Genitourinary: Negative for dysuria and hematuria.  Skin: Positive for itching and rash.    Past Medical History:  Diagnosis Date  . Anemia   . HIV infection (HCC)   . Hyperlipidemia   . Hypertension   . Neuropathy     Outpatient Medications Prior to Visit  Medication Sig Dispense Refill  . fluconazole (DIFLUCAN) 200 MG tablet Take 1 tablet (200 mg total) by mouth daily. 14 tablet  1  . hydrochlorothiazide (HYDRODIURIL) 25 MG tablet TAKE 1 TABLET(25 MG) BY MOUTH DAILY 90 tablet 1  . azithromycin (ZITHROMAX) 600 MG tablet Take 2 tablets (1,200 mg total) by mouth once a week. (Patient not taking: Reported on 01/21/2019) 12 tablet 0  . BIKTARVY 50-200-25 MG TABS tablet TAKE 1 TABLET BY MOUTH DAILY 30 tablet 4  . gabapentin (NEURONTIN) 600 MG tablet Take 300 mg by mouth as needed (pain).     . methocarbamol (ROBAXIN) 500 MG tablet Take 1 tablet (500 mg total) by mouth every 8 (eight) hours as needed. (Patient not taking: Reported on 01/21/2019) 30 tablet 0  .  nystatin (MYCOSTATIN) 100000 UNIT/ML suspension TK 4 MLS PO QID    . SUMAtriptan (IMITREX) 50 MG tablet     . sulfamethoxazole-trimethoprim (BACTRIM DS,SEPTRA DS) 800-160 MG tablet Take 1 tablet by mouth daily. 30 tablet 5   No facility-administered medications prior to visit.      No Known Allergies  Social History   Tobacco Use  . Smoking status: Never Smoker  . Smokeless tobacco: Never Used  Substance Use Topics  . Alcohol use: Not Currently    Comment: social  . Drug use: No    Social History   Substance and Sexual Activity  Sexual Activity Not Currently  . Partners: Male  . Birth control/protection: Condom     Objective:   Vitals:   01/21/19 0934  BP: (!) 150/100  Pulse: (!) 120  Temp: 98.1 F (36.7 C)  Weight: 107 lb (48.5 kg)   Body mass index is 20.22 kg/m.  Physical Exam Exam conducted with a chaperone present.  Constitutional:      Comments: She is seated in the chair today alone.  Shaky, nearly tearful.  Needs assistance getting on and off table.  Otherwise no distress  HENT:     Mouth/Throat:     Mouth: Mucous membranes are moist.     Pharynx: Oropharynx is clear.  Eyes:     General: No scleral icterus.    Pupils: Pupils are equal, round, and reactive to light.  Cardiovascular:     Rate and Rhythm: Normal rate.  Pulmonary:     Effort: Pulmonary effort is normal.  Abdominal:     General: Abdomen is flat. There is no distension.     Tenderness: There is no abdominal tenderness.  Genitourinary:    Exam position: Lithotomy position.     Pubic Area: Rash present.     Comments: She refused to allow me do much on exam today.  See photo for reference of visual.  The little bit she allowed me to touch reveals soft skin underneath ulceration bed.  No signs of deep abscess or evidence of infection.  There is no odor, drainage.  Small bleeding. Lymphadenopathy:     Cervical: No cervical adenopathy.     Lower Body: No right inguinal adenopathy. No left  inguinal adenopathy.       Lab Results Lab Results  Component Value Date   WBC 4.2 01/09/2019   HGB 10.3 (L) 01/09/2019   HCT 31.0 (L) 01/09/2019   MCV 91.4 01/09/2019   PLT 213 01/09/2019    Lab Results  Component Value Date   CREATININE 1.54 (H) 01/09/2019   BUN 36 (H) 01/09/2019   NA 139 01/09/2019   K 3.5 01/09/2019   CL 100 01/09/2019   CO2 27 01/09/2019    Lab Results  Component Value Date   ALT 8 01/09/2019  AST 24 01/09/2019   ALKPHOS 77 05/07/2017   BILITOT 0.3 01/09/2019    Lab Results  Component Value Date   CHOL 162 01/30/2017   HDL 25 (L) 01/30/2017   LDLCALC 58 01/30/2017   TRIG 394 (H) 01/30/2017   CHOLHDL 6.5 (H) 01/30/2017   HIV 1 RNA Quant (copies/mL)  Date Value  01/09/2019 238,000 (H)  05/07/2017 286,000 (H)   CD4 T Cell Abs (/uL)  Date Value  01/09/2019 6 (L)  10/19/2018 10 (L)  12/30/2017 70 (L)     Assessment & Plan:   Problem List Items Addressed This Visit      High   AIDS (acquired immune deficiency syndrome) (HCC) - Primary    I reviewed with Krystal Bowen her most recent lab work today.  Her CD4 count is only 6.  She was unaware of this.  She says she understands what this means however we reminded her today about AIDS defining criteria in regards to lab work or syndromes.  She is not taking her Bactrim.  Completed fluconazole with resolution of oral candidiasis.  Aside from genital lesion which we do not quite understand there is no signs of opportunistic infection on exam today.  I do not think she is planning on resuming her Biktarvy.      Relevant Medications   nystatin (MYCOSTATIN) 100000 UNIT/ML suspension   valACYclovir (VALTREX) 1000 MG tablet   sulfamethoxazole-trimethoprim (BACTRIM,SEPTRA) 200-40 MG/5ML suspension     Unprioritized   Noncompliance w/medication treatment due to intermit use of medication (Chronic)    We spent a lot of our visit today discussing some of Krystal Bowen's barriers to taking her medications.  She  denies the need to have any's assistance through our community support RN, mental health / behavioral counselors, or any staff. I told her that although her diagnosis cannot be un-done we may still be in a position to offer life-saving treatment for her.       Vulvar lesion    Krystal Bowen has a single painful, ulcerated lesion that sounds to have quickly evolved over the last week involving the clitoral hood.  Although her exam was limited due to her pain and anxiety it does not appear that she has any abscess/cellulitis or infection.  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  I will trial her on Valtrex 1 g twice a day for 10 days and reassess.  We discussed biopsy of the site to better understand and characterize the lesion.  She understandably is resistant to this due to not only the pain but fear of learning and answer that may be bad.  I recommended a Pap smear today and pelvic exam however she declined.  I will call her in 1 week to see how she is feeling and how her lesion is progressing or regressing.  I asked her to please call me if her lesion continues to worsen or has any changes. GYN referral will be the next step.      RESOLVED: Thrush of mouth and esophagus (HCC)   Relevant Medications   nystatin (MYCOSTATIN) 100000 UNIT/ML suspension   valACYclovir (VALTREX) 1000 MG tablet   sulfamethoxazole-trimethoprim (BACTRIM,SEPTRA) 200-40 MG/5ML suspension    Other Visit Diagnoses    Routine screening for STI (sexually transmitted infection)       Relevant Orders   Urine cytology ancillary only (  Completed)     I spent greater than 40 minutes with the patient including greater than 80% of time in face to face counsel with regards to exam, differential dx, adherence counseling and in coordination of her care.   Rexene AlbertsStephanie Dixon, MSN, NP-C  [facility] Pager: [phone] Office: [phone]

## 2019-01-21 ENCOUNTER — Other Ambulatory Visit (HOSPITAL_COMMUNITY)
Admission: RE | Admit: 2019-01-21 | Discharge: 2019-01-21 | Disposition: A | Discharge status: Home / Self Care | Payer: Medicaid Other | Source: Ambulatory Visit | Attending: Infectious Diseases | Admitting: Infectious Diseases

## 2019-01-21 ENCOUNTER — Ambulatory Visit (INDEPENDENT_AMBULATORY_CARE_PROVIDER_SITE_OTHER): Payer: Medicaid Other | Admitting: Infectious Diseases

## 2019-01-21 ENCOUNTER — Other Ambulatory Visit: Payer: Self-pay

## 2019-01-21 ENCOUNTER — Encounter: Payer: Self-pay | Admitting: Infectious Diseases

## 2019-01-21 VITALS — BP 150/100 | HR 120 | Temp 98.1°F | Wt 107.0 lb

## 2019-01-21 DIAGNOSIS — N9089 Other specified noninflammatory disorders of vulva and perineum: Secondary | ICD-10-CM

## 2019-01-21 DIAGNOSIS — Z113 Encounter for screening for infections with a predominantly sexual mode of transmission: Secondary | ICD-10-CM

## 2019-01-21 DIAGNOSIS — Z9114 Patient's other noncompliance with medication regimen: Secondary | ICD-10-CM

## 2019-01-21 DIAGNOSIS — B20 Human immunodeficiency virus [HIV] disease: Secondary | ICD-10-CM

## 2019-01-21 DIAGNOSIS — B3781 Candidal esophagitis: Secondary | ICD-10-CM

## 2019-01-21 DIAGNOSIS — B370 Candidal stomatitis: Secondary | ICD-10-CM

## 2019-01-21 MED ORDER — SULFAMETHOXAZOLE-TRIMETHOPRIM 200-40 MG/5ML PO SUSP: 20.0000 mL | Freq: Every day | ORAL | 11 refills | Status: DC

## 2019-01-21 MED ORDER — VALACYCLOVIR HCL 1 G PO TABS: 1000.0000 mg | ORAL_TABLET | Freq: Two times a day (BID) | ORAL | 0 refills | Status: DC

## 2019-01-21 NOTE — Patient Instructions (Addendum)
For your lesion -  It does not appear to be infected today.  Will try an antiviral medication twice a day for 10 days to see if this helps. If this continues to get bigger I need you to let me know so we can arrange for a biopsy to identify what this is.   Sitz baths will help keep the area clean. Could use some vaseline to prevent pads/underwear from sticking or irritating the spot.   For your bactrim (antibiotic to take daily to prevent infections) I have sent this in as a liquid for you. Please take 20 mL once daily.   Please consider restarting your biktarvy. If you consistently need to crush your pills I would recommend we switch back to descovy and tivicay.   Please follow up with Dr. Orvan Falconer at your currently scheduled appointment or sooner if needed.

## 2019-01-22 DIAGNOSIS — N9089 Other specified noninflammatory disorders of vulva and perineum: Secondary | ICD-10-CM | POA: Insufficient documentation

## 2019-01-22 LAB — URINE CYTOLOGY ANCILLARY ONLY
Chlamydia: NEGATIVE
Neisseria Gonorrhea: NEGATIVE

## 2019-01-22 NOTE — Assessment & Plan Note (Signed)
We spent a lot of our visit today discussing some of Krystal Bowen's barriers to taking her medications.  She denies the need to have any's assistance through our community support RN, mental health / behavioral counselors, or any staff. I told her that although her diagnosis cannot be un-done we may still be in a position to offer life-saving treatment for her.

## 2019-01-22 NOTE — Assessment & Plan Note (Addendum)
Kloie has a single painful, ulcerated lesion that sounds to have quickly evolved over the last week involving the clitoral hood.  Although her exam was limited due to her pain and anxiety it does not appear that she has any abscess/cellulitis or infection.  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  I will trial her on Valtrex 1 g twice a day for 10 days and reassess.  We discussed biopsy of the site to better understand and characterize the lesion.  She understandably is resistant to this due to not only the pain but fear of learning and answer that may be bad.  I recommended a Pap smear today and pelvic exam however she declined.  I will call her in 1 week to see how she is feeling and how her lesion is progressing or regressing.  I asked her to please call me if her lesion continues to worsen or has any changes. GYN referral will be the next step.

## 2019-01-22 NOTE — Assessment & Plan Note (Signed)
I reviewed with Krystal Bowen her most recent lab work today.  Her CD4 count is only 6.  She was unaware of this.  She says she understands what this means however we reminded her today about AIDS defining criteria in regards to lab work or syndromes.  She is not taking her Bactrim.  Completed fluconazole with resolution of oral candidiasis.  Aside from genital lesion which we do not quite understand there is no signs of opportunistic infection on exam today.  I do not think she is planning on resuming her Biktarvy.

## 2019-01-24 ENCOUNTER — Encounter: Payer: Self-pay | Admitting: Internal Medicine

## 2019-02-12 ENCOUNTER — Encounter: Payer: Medicaid Other | Admitting: Internal Medicine

## 2019-02-17 ENCOUNTER — Telehealth: Payer: Self-pay | Admitting: Internal Medicine

## 2019-02-17 NOTE — Telephone Encounter (Signed)
COVID-19 Pre-Screening Questions: ° °Do you currently have a fever (>100 °F), chills or unexplained body aches? No  ° °Are you currently experiencing new cough, shortness of breath, sore throat, runny nose?no  °•  °Have you recently travelled outside the state of [place] in the last 14 days?no  °•  °1. Have you been in contact with someone that is currently pending confirmation of Covid19 testing or has been confirmed to have the Covid19 virus? No  ° °

## 2019-02-18 ENCOUNTER — Encounter: Payer: Self-pay | Admitting: Internal Medicine

## 2019-02-18 ENCOUNTER — Telehealth: Payer: Self-pay | Admitting: Pharmacy Technician

## 2019-02-18 ENCOUNTER — Ambulatory Visit (INDEPENDENT_AMBULATORY_CARE_PROVIDER_SITE_OTHER): Payer: Medicaid Other | Admitting: Internal Medicine

## 2019-02-18 ENCOUNTER — Other Ambulatory Visit: Payer: Self-pay

## 2019-02-18 ENCOUNTER — Ambulatory Visit: Payer: Medicaid Other | Admitting: Infectious Diseases

## 2019-02-18 DIAGNOSIS — B259 Cytomegaloviral disease, unspecified: Secondary | ICD-10-CM | POA: Insufficient documentation

## 2019-02-18 DIAGNOSIS — B20 Human immunodeficiency virus [HIV] disease: Secondary | ICD-10-CM

## 2019-02-18 DIAGNOSIS — H538 Other visual disturbances: Secondary | ICD-10-CM

## 2019-02-18 DIAGNOSIS — F331 Major depressive disorder, recurrent, moderate: Secondary | ICD-10-CM

## 2019-02-18 DIAGNOSIS — H3090 Unspecified chorioretinal inflammation, unspecified eye: Secondary | ICD-10-CM | POA: Insufficient documentation

## 2019-02-18 NOTE — Progress Notes (Signed)
Patient Active Problem List   Diagnosis Date Noted  . HIV disease (HCC) 02/12/2017    Priority: High  . Blurred vision, right eye 02/18/2019  . Vulvar lesion 01/22/2019  . At risk for opportunistic infections 01/10/2019  . Diarrhea 10/18/2018  . Noncompliance w/medication treatment due to intermit use of medication 10/18/2018  . AIDS (acquired immune deficiency syndrome) (HCC) 10/18/2018  . HIV infection (HCC)   . Tachycardia   . Depression 01/01/2018  . Peripheral neuropathy 02/13/2017  . HTN (hypertension) 02/12/2017  . Normocytic anemia 02/12/2017  . Dyslipidemia 02/12/2017  . History of shingles 02/12/2017    Patient's Medications  New Prescriptions   No medications on file  Previous Medications   AZITHROMYCIN (ZITHROMAX) 600 MG TABLET    Take 2 tablets (1,200 mg total) by mouth once a week.   BIKTARVY 50-200-25 MG TABS TABLET    TAKE 1 TABLET BY MOUTH DAILY   FLUCONAZOLE (DIFLUCAN) 200 MG TABLET    Take 1 tablet (200 mg total) by mouth daily.   GABAPENTIN (NEURONTIN) 600 MG TABLET    Take 300 mg by mouth as needed (pain).    HYDROCHLOROTHIAZIDE (HYDRODIURIL) 25 MG TABLET    TAKE 1 TABLET(25 MG) BY MOUTH DAILY   NYSTATIN (MYCOSTATIN) 100000 UNIT/ML SUSPENSION    TK 4 MLS PO QID   SULFAMETHOXAZOLE-TRIMETHOPRIM (BACTRIM,SEPTRA) 200-40 MG/5ML SUSPENSION    Take 20 mLs by mouth daily.   SUMATRIPTAN (IMITREX) 50 MG TABLET      Modified Medications   No medications on file  Discontinued Medications   METHOCARBAMOL (ROBAXIN) 500 MG TABLET    Take 1 tablet (500 mg total) by mouth every 8 (eight) hours as needed.    Subjective: Krystal Bowen is in for her routine HIV follow-up visit.  He was recently approved for [facility] and can now get her medications for free.  She is on her second bottle of Biktarvy.  She is tolerating it well.  She missed 2 doses between her first and second bottle but otherwise has been taking it every day.  Over the last 2 weeks she has noted some  floaters and slightly blurry vision in her right eye.  She has not had any pain in her eye.  Other than that she is feeling better with more energy.  She is sleeping better.  She has been under a little bit more stress recently after finding out that her 1 year old daughter is pregnant.  Review of Systems: Review of Systems  Constitutional: Negative for chills, diaphoresis, fever, malaise/fatigue and weight loss.  HENT: Negative for sore throat.   Eyes: Positive for blurred vision.  Respiratory: Negative for cough, sputum production and shortness of breath.   Cardiovascular: Negative for chest pain.  Gastrointestinal: Negative for abdominal pain, diarrhea, heartburn, nausea and vomiting.  Genitourinary: Negative for dysuria and frequency.  Musculoskeletal: Negative for joint pain and myalgias.       Last week she had 1 day of transient soreness in her calves.  She had not been doing any unusual activity before this began.  It resolved spontaneously.  Skin: Negative for rash.  Neurological: Negative for dizziness and headaches.  Psychiatric/Behavioral: Negative for depression and substance abuse. The patient is not nervous/anxious.     Past Medical History:  Diagnosis Date  . Anemia   . HIV infection (HCC)   . Hyperlipidemia   . Hypertension   . Neuropathy     Social History  Tobacco Use  . Smoking status: Never Smoker  . Smokeless tobacco: Never Used  Substance Use Topics  . Alcohol use: Not Currently    Comment: social  . Drug use: No    Family History  Problem Relation Age of Onset  . Heart attack Mother   . Stroke Mother   . Hyperlipidemia Mother   . Cancer Father        Leukemia   . Hyperlipidemia Maternal Grandmother   . Alzheimer's disease Maternal Grandmother   . Heart attack Maternal Grandfather     No Known Allergies  Health Maintenance  Topic Date Due  . INFLUENZA VACCINE  05/24/2019  . PAP SMEAR-Modifier  05/03/2020  . TETANUS/TDAP  09/09/2020  .  HIV Screening  Completed    Objective:  Vitals:   02/18/19 1101  BP: (!) 155/96  Pulse: 91  Temp: 98.1 F (36.7 C)  TempSrc: Oral  Weight: 107 lb (48.5 kg)   Body mass index is 20.22 kg/m.  Physical Exam Constitutional:      Comments: She is in better spirits and is more talkative.  HENT:     Mouth/Throat:     Pharynx: No oropharyngeal exudate.  Eyes:     Conjunctiva/sclera: Conjunctivae normal.     Pupils: Pupils are equal, round, and reactive to light.  Cardiovascular:     Rate and Rhythm: Normal rate and regular rhythm.     Heart sounds: No murmur.  Pulmonary:     Breath sounds: Normal breath sounds.  Abdominal:     Palpations: Abdomen is soft. There is no mass.     Tenderness: There is no abdominal tenderness.  Musculoskeletal: Normal range of motion.  Skin:    Findings: No rash.  Neurological:     Mental Status: She is alert and oriented to person, place, and time.  Psychiatric:        Mood and Affect: Mood normal.     Lab Results Lab Results  Component Value Date   WBC 4.2 01/09/2019   HGB 10.3 (L) 01/09/2019   HCT 31.0 (L) 01/09/2019   MCV 91.4 01/09/2019   PLT 213 01/09/2019    Lab Results  Component Value Date   CREATININE 1.54 (H) 01/09/2019   BUN 36 (H) 01/09/2019   NA 139 01/09/2019   K 3.5 01/09/2019   CL 100 01/09/2019   CO2 27 01/09/2019    Lab Results  Component Value Date   ALT 8 01/09/2019   AST 24 01/09/2019   ALKPHOS 77 05/07/2017   BILITOT 0.3 01/09/2019    Lab Results  Component Value Date   CHOL 162 01/30/2017   HDL 25 (L) 01/30/2017   LDLCALC 58 01/30/2017   TRIG 394 (H) 01/30/2017   CHOLHDL 6.5 (H) 01/30/2017   Lab Results  Component Value Date   LABRPR NON REAC 01/30/2017   HIV 1 RNA Quant (copies/mL)  Date Value  01/09/2019 238,000 (H)  05/07/2017 286,000 (H)   CD4 T Cell Abs (/uL)  Date Value  01/09/2019 6 (L)  10/19/2018 10 (L)  12/30/2017 70 (L)     Problem List Items Addressed This Visit       High   HIV disease (HCC)    It sounds as though her adherence has improved fairly dramatically recently.  I will recheck blood work today and see her back in 2 weeks.      Relevant Orders   T-helper cell (CD4)- ([facility] clinic only)   HIV-1 RNA quant-no  reflex-bld   CBC   Comprehensive metabolic panel     Unprioritized   Depression    It seems as though her depression is improving.      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  I will see if I can arrange an urgent ophthalmology evaluation.  I will see her back in 2 weeks.      Relevant Orders   Ambulatory referral to Ophthalmology        [name], MD [facility] [phone] pager   [phone] cell 02/18/2019, 11:31 AM

## 2019-02-18 NOTE — Assessment & Plan Note (Signed)
It sounds as though her adherence has improved fairly dramatically recently.  I will recheck blood work today and see her back in 2 weeks.

## 2019-02-18 NOTE — Assessment & Plan Note (Signed)
I am worried about the development of CMV retinitis given her very low CD4 count.  I will see if I can arrange an urgent ophthalmology evaluation.  I will see her back in 2 weeks.

## 2019-02-18 NOTE — Telephone Encounter (Signed)
She has HMAP.

## 2019-02-18 NOTE — Assessment & Plan Note (Signed)
It seems as though her depression is improving.

## 2019-02-18 NOTE — Telephone Encounter (Signed)
[facility] Patient Product/process development scientist completed.    The patient is uninsured (has Medicaid, but not the type that will cover HIV medications) and will need patient assistance for medication.  We can complete the application and will need to meet with the patient for signatures and income documentation.  Netty Starring. Dimas Aguas CPhT [facility] Pharmacy Patient [facility] Phone: [phone] Fax:  [phone]

## 2019-02-19 LAB — T-HELPER CELL (CD4) - (RCID CLINIC ONLY)
CD4 % Helper T Cell: 7 % — ABNORMAL LOW (ref 33–65)
CD4 T Cell Abs: <35 /uL — ABNORMAL LOW (ref 400–1790)

## 2019-02-25 LAB — CBC
HCT: 29.5 % — ABNORMAL LOW (ref 35.0–45.0)
Hemoglobin: 9.5 g/dL — ABNORMAL LOW (ref 11.7–15.5)
MCH: 30 pg (ref 27.0–33.0)
MCHC: 32.2 g/dL (ref 32.0–36.0)
MCV: 93.1 fL (ref 80.0–100.0)
MPV: 12.5 fL (ref 7.5–12.5)
Platelets: 223 10*3/uL (ref 140–400)
RBC: 3.17 10*6/uL — ABNORMAL LOW (ref 3.80–5.10)
RDW: 15.1 % — ABNORMAL HIGH (ref 11.0–15.0)
WBC: 1.9 10*3/uL — ABNORMAL LOW (ref 3.8–10.8)

## 2019-02-25 LAB — COMPREHENSIVE METABOLIC PANEL
AG Ratio: 0.8 (calc) — ABNORMAL LOW (ref 1.0–2.5)
ALT: 32 U/L — ABNORMAL HIGH (ref 6–29)
AST: 69 U/L — ABNORMAL HIGH (ref 10–35)
Albumin: 3.5 g/dL — ABNORMAL LOW (ref 3.6–5.1)
Alkaline phosphatase (APISO): 66 U/L (ref 31–125)
BUN/Creatinine Ratio: 19 (calc) (ref 6–22)
BUN: 21 mg/dL (ref 7–25)
CO2: 27 mmol/L (ref 20–32)
Calcium: 9.1 mg/dL (ref 8.6–10.2)
Chloride: 106 mmol/L (ref 98–110)
Creat: 1.11 mg/dL — ABNORMAL HIGH (ref 0.50–1.10)
Globulin: 4.6 g/dL (calc) — ABNORMAL HIGH (ref 1.9–3.7)
Glucose, Bld: 80 mg/dL (ref 65–99)
Potassium: 4.3 mmol/L (ref 3.5–5.3)
Sodium: 139 mmol/L (ref 135–146)
Total Bilirubin: 0.2 mg/dL (ref 0.2–1.2)
Total Protein: 8.1 g/dL (ref 6.1–8.1)

## 2019-02-25 LAB — HIV-1 RNA QUANT-NO REFLEX-BLD
HIV 1 RNA Quant: 46400 copies/mL — ABNORMAL HIGH
HIV-1 RNA Quant, Log: 4.67 Log copies/mL — ABNORMAL HIGH

## 2019-02-27 ENCOUNTER — Other Ambulatory Visit: Payer: Self-pay | Admitting: Infectious Diseases

## 2019-03-19 ENCOUNTER — Telehealth: Payer: Self-pay | Admitting: Infectious Diseases

## 2019-03-19 NOTE — Telephone Encounter (Signed)
COVID-19 Pre-Screening Questions: ° °Do you currently have a fever (>100 °F), chills or unexplained body aches? No  ° °Are you currently experiencing new cough, shortness of breath, sore throat, runny nose? No  °•  °Have you recently travelled outside the state of [place] in the last 14 days? No  °•  °1. Have you been in contact with someone that is currently pending confirmation of Covid19 testing or has been confirmed to have the Covid19 virus?  No  ° °

## 2019-03-20 ENCOUNTER — Ambulatory Visit: Payer: Medicaid Other | Admitting: Internal Medicine

## 2019-03-20 ENCOUNTER — Ambulatory Visit: Payer: Medicaid Other | Admitting: Infectious Diseases

## 2019-03-24 ENCOUNTER — Ambulatory Visit: Payer: Medicaid Other | Admitting: Internal Medicine

## 2019-04-10 ENCOUNTER — Telehealth: Payer: Self-pay | Admitting: *Deleted

## 2019-04-10 MED ORDER — CARNITINE POWD
Status: DC
Start: 2019-04-14 — End: 2019-04-10

## 2019-04-10 MED ORDER — NERVINE PO
20.00 | ORAL | Status: DC
Start: 2019-04-11 — End: 2019-04-10

## 2019-04-10 MED ORDER — ATROPINE SULFATE 1 % OP SOLN
1.00 | OPHTHALMIC | Status: DC
Start: 2019-04-14 — End: 2019-04-10

## 2019-04-10 MED ORDER — PREDNISOLONE ACETATE 1 % OP SUSP
1.00 | OPHTHALMIC | Status: DC
Start: 2019-04-14 — End: 2019-04-10

## 2019-04-10 MED ORDER — HYDROCHLOROTHIAZIDE 25 MG PO TABS
25.00 | ORAL_TABLET | ORAL | Status: DC
Start: 2019-04-15 — End: 2019-04-10

## 2019-04-10 MED ORDER — Medication
1.00 | Status: DC
Start: 2019-04-10 — End: 2019-04-10

## 2019-04-10 MED ORDER — DIPHENHYDRAMINE HCL 25 MG PO CAPS
25.00 | ORAL_CAPSULE | ORAL | Status: DC
Start: ? — End: 2019-04-10

## 2019-04-10 MED ORDER — UCERIS 9 MG PO TB24
15.00 | ORAL_TABLET | ORAL | Status: DC
Start: 2019-04-10 — End: 2019-04-10

## 2019-04-10 MED ORDER — VALGANCICLOVIR HCL 450 MG PO TABS
900.00 | ORAL_TABLET | ORAL | Status: DC
Start: 2019-04-10 — End: 2019-04-10

## 2019-04-10 MED ORDER — BICTEGRAVIR-EMTRICITAB-TENOFOV 50-200-25 MG PO TABS
1.00 | ORAL_TABLET | ORAL | Status: DC
Start: 2019-04-15 — End: 2019-04-10

## 2019-04-10 NOTE — Telephone Encounter (Signed)
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  Per Clair Gulling, she will likely be hospitalized for the entire IV treatment. Landis Gandy, RN

## 2019-04-10 NOTE — Telephone Encounter (Signed)
Though mild, I was worried about CMV retinitis 2 months ago.  I suspect that she will agree to hospitalization for induction therapy but the bigger and more difficult issue is will she agreed to stay on therapy for CMV and HIV and in care after discharge.

## 2019-04-14 MED ORDER — HEPARIN SODIUM (PORCINE) 5000 UNIT/ML IJ SOLN
5000.00 | INTRAMUSCULAR | Status: DC
Start: 2019-04-14 — End: 2019-04-14

## 2019-04-14 MED ORDER — NERVINE PO
40.00 | ORAL | Status: DC
Start: ? — End: 2019-04-14

## 2019-04-14 MED ORDER — VALGANCICLOVIR HCL 450 MG PO TABS
450.00 | ORAL_TABLET | ORAL | Status: DC
Start: 2019-04-15 — End: 2019-04-14

## 2019-04-14 MED ORDER — PROMETHAZINE HCL (BULK CHEMICALS - P'S)
25.00 | Status: DC
Start: 2019-04-14 — End: 2019-04-14

## 2019-04-14 MED ORDER — QUINERVA 260 MG PO TABS
650.00 | ORAL_TABLET | ORAL | Status: DC
Start: ? — End: 2019-04-14

## 2019-04-14 MED ORDER — TRAMADOL HCL 50 MG PO TABS
50.00 | ORAL_TABLET | ORAL | Status: DC
Start: ? — End: 2019-04-14

## 2019-04-14 MED ORDER — HYDROXYZINE HCL 25 MG PO TABS
25.00 | ORAL_TABLET | ORAL | Status: DC
Start: ? — End: 2019-04-14

## 2019-04-14 NOTE — Telephone Encounter (Signed)
Patient due to be released today 04/14/19 from [facility] is faxing notes and wanted to make sure we know and for [place] to follow up with her soon. Asked if he would like to make an office visit and he advised he would have the patient call soon to do so.

## 2019-04-16 ENCOUNTER — Telehealth: Payer: Self-pay | Admitting: *Deleted

## 2019-04-16 ENCOUNTER — Telehealth: Payer: Self-pay | Admitting: Pharmacist

## 2019-04-16 NOTE — Telephone Encounter (Signed)
Spoke with Krystal Bowen.  She will come see Krystal Bowen 6/25 1:45 (per Dr Megan Salon).  She was upset, as her medications were sent to non-adap pharmacy at discharge from [facility].  Her mother had to pay $150 for [name]'s medication valcyte/2 eye drops/1 eye ointment.  RN advised I would speak with [facility] to see if they had any way to help (they do not), remind her that only 1 [facility] in [place] participates in the ADAP program. RN reset her pharmacy choice to [facility]. She will bring her receipt with her to the visit, just in case. (Eye drops and ointment do not seem to be covered under ADAP.)

## 2019-04-16 NOTE — Telephone Encounter (Signed)
COVID-19 Pre-Screening Questions: ° °Do you currently have a fever (>100 °F), chills or unexplained body aches? No  ° °Are you currently experiencing new cough, shortness of breath, sore throat, runny nose? No  °•  °Have you recently travelled outside the state of [place] in the last 14 days? No  °•  °1. Have you been in contact with someone that is currently pending confirmation of Covid19 testing or has been confirmed to have the Covid19 virus?  No  ° °

## 2019-04-16 NOTE — Telephone Encounter (Signed)
RN called patient to schedule follow up after she was discharged from [facility].  Per Dr Megan Salon, scheduled her 6/25 with Cassie.  Patient upset her discharge medications were sent to non-adap pharmacy from [facility], her mother had to pay $150+ for valcyte/eye drops/eye ointment.  RN offered to contact pharmacy to see if anything can be done (unfortunately not per [facility]) and reminded her only 1 local [facility] participates in ADAP - set her pharmacy preference to [facility] to avoid this in the future.  Seems only valcyte would be covered under ADAP. Patient will come in 6/25, will meet with financial counseling as well - she may also benefit from [facility]? Landis Gandy, RN

## 2019-04-17 ENCOUNTER — Other Ambulatory Visit: Payer: Self-pay

## 2019-04-17 ENCOUNTER — Ambulatory Visit (INDEPENDENT_AMBULATORY_CARE_PROVIDER_SITE_OTHER): Payer: Self-pay | Admitting: Pharmacist

## 2019-04-17 DIAGNOSIS — H3090 Unspecified chorioretinal inflammation, unspecified eye: Secondary | ICD-10-CM

## 2019-04-17 DIAGNOSIS — B259 Cytomegaloviral disease, unspecified: Secondary | ICD-10-CM

## 2019-04-17 DIAGNOSIS — B20 Human immunodeficiency virus [HIV] disease: Secondary | ICD-10-CM

## 2019-04-17 NOTE — Progress Notes (Signed)
HPI: Krystal Bowen is a 48 y.o. female who presents to the [facility] pharmacy clinic for HIV follow-up.  Patient Active Problem List   Diagnosis Date Noted  . Blurred vision, right eye 02/18/2019  . Vulvar lesion 01/22/2019  . At risk for opportunistic infections 01/10/2019  . Diarrhea 10/18/2018  . Noncompliance w/medication treatment due to intermit use of medication 10/18/2018  . AIDS (acquired immune deficiency syndrome) (HCC) 10/18/2018  . HIV infection (HCC)   . Tachycardia   . Depression 01/01/2018  . Peripheral neuropathy 02/13/2017  . HIV disease (HCC) 02/12/2017  . HTN (hypertension) 02/12/2017  . Normocytic anemia 02/12/2017  . Dyslipidemia 02/12/2017  . History of shingles 02/12/2017    Patient's Medications  New Prescriptions   No medications on file  Previous Medications   ATROPINE 1 % OPHTHALMIC SOLUTION    Place 1 drop into the right eye 2 times daily for 30 days.   AZITHROMYCIN (ZITHROMAX) 600 MG TABLET    Take 2 tablets (1,200 mg total) by mouth once a week.   BIKTARVY 50-200-25 MG TABS TABLET    TAKE 1 TABLET BY MOUTH DAILY   DIPHENHYDRAMINE (BENADRYL) 25 MG CAPSULE    Take by mouth.   ERYTHROMYCIN OPHTHALMIC OINTMENT    Place into the right eye 2 times daily.   FLUCONAZOLE (DIFLUCAN) 200 MG TABLET    Take 1 tablet (200 mg total) by mouth daily.   GABAPENTIN (NEURONTIN) 600 MG TABLET    Take 300 mg by mouth as needed (pain).    HYDROCHLOROTHIAZIDE (HYDRODIURIL) 25 MG TABLET    TAKE 1 TABLET(25 MG) BY MOUTH DAILY   NYSTATIN (MYCOSTATIN) 100000 UNIT/ML SUSPENSION    TK 4 MLS PO QID   PREDNISOLONE ACETATE (PRED FORTE) 1 % OPHTHALMIC SUSPENSION    Place 1 drop into the right eye every 2 hours.   SULFAMETHOXAZOLE-TRIMETHOPRIM (BACTRIM,SEPTRA) 200-40 MG/5ML SUSPENSION    Take 20 mLs by mouth daily.   SUMATRIPTAN (IMITREX) 50 MG TABLET       VALACYCLOVIR (VALTREX) 1000 MG TABLET    TAKE 1 TABLET(1000 MG) BY MOUTH TWICE DAILY FOR 10 DAYS   VALGANCICLOVIR (VALCYTE) 450  MG TABLET    TK 1 T PO BID FOR 14 DAYS  Modified Medications   No medications on file  Discontinued Medications   No medications on file    Allergies: No Known Allergies  Past Medical History: Past Medical History:  Diagnosis Date  . Anemia   . HIV infection (HCC)   . Hyperlipidemia   . Hypertension   . Neuropathy     Social History: Social History   Socioeconomic History  . Marital status: Legally Separated    Spouse name: Not on file  . Number of children: 4  . Years of education: Not on file  . Highest education level: Not on file  Occupational History  . Occupation: CNA for [employer]    Comment: Previously a LawyerCNA for 20 years  Social Needs  . Financial resource strain: Not on file  . Food insecurity    Worry: Not on file    Inability: Not on file  . Transportation needs    Medical: Not on file    Non-medical: Not on file  Tobacco Use  . Smoking status: Never Smoker  . Smokeless tobacco: Never Used  Substance and Sexual Activity  . Alcohol use: Not Currently    Comment: social  . Drug use: No  . Sexual activity: Not Currently  Partners: Male    Birth control/protection: Condom  Lifestyle  . Physical activity    Days per week: Not on file    Minutes per session: Not on file  . Stress: Not on file  Relationships  . Social Musicianconnections    Talks on phone: Not on file    Gets together: Not on file    Attends religious service: Not on file    Active member of club or organization: Not on file    Attends meetings of clubs or organizations: Not on file    Relationship status: Not on file  Other Topics Concern  . Not on file  Social History Narrative  . Not on file    Labs: Lab Results  Component Value Date   HIV1RNAQUANT 46,400 (H) 02/18/2019   HIV1RNAQUANT 238,000 (H) 01/09/2019   HIV1RNAQUANT 286,000 (H) 05/07/2017   CD4TABS <35 (L) 02/18/2019   CD4TABS 6 (L) 01/09/2019   CD4TABS 10 (L) 10/19/2018    RPR and STI Lab Results   Component Value Date   LABRPR NON REAC 01/30/2017    STI Results GC CT  01/21/2019 Negative Negative  03/29/2009 - POSITIVERTFDELIM(NOTE)  Testing performed using the BD Probetec ET Chlamydia trachomatis and Neisseria gonorrhea amplified DNA assay.(A)    Hepatitis B Lab Results  Component Value Date   HEPBSAB POS (A) 01/30/2017   HEPBSAG NEGATIVE 01/30/2017   HEPBCAB NON REACTIVE 01/30/2017   Hepatitis C No results found for: HEPCAB, HCVRNAPCRQN Hepatitis A Lab Results  Component Value Date   HAV NON REACTIVE 01/30/2017   Lipids: Lab Results  Component Value Date   CHOL 162 01/30/2017   TRIG 394 (H) 01/30/2017   HDL 25 (L) 01/30/2017   CHOLHDL 6.5 (H) 01/30/2017   VLDL 79 (H) 01/30/2017   LDLCALC 58 01/30/2017    Current HIV Regimen: Biktarvy  Assessment: Krystal Bowen is here today to follow-up after a recent hospital stay at [facility] for CMV retinitis. She last saw Dr. Orvan Falconerampbell on 02/18/2019 and stated that she was doing well on her Biktarvy but was complaining of blurry vision and pain in her right eye.  An ophthalmology referral was placed but they could not get in touch with patient to schedule an appointment. She later presented to [facility] on 6/15 and was diagnosed with CMV retinitis.  She was prescribed 21 days of oral valganciclovir (renally dosed 450 mg BID) and had 14 days left at the time of her discharge on Monday 6/22. She states that she is tolerating the medication well and takes her first dose at 1030am and her 2nd dose at 1030pm.  She has not missed a single dose and takes it religiously.  She also has atropine eye drops, prednisolone eye drops, and erythromycin ointment for her eyes.  She has been good about taking her eye drops but has not had perfect adherence to the q2h schedule (prednisolone is q2h) as she has to have someone help her put them in and her mother and daughter are often gone during the day.  Her eye feels better and the redness has gone down  dramatically in the last week.  It is not quite as painful anymore. She said her peripheral vision is coming back and they told her that they were optimistic about her eye sight returning.  She has struggled a great deal with adherence to her ART in the past.  She has never been able to get over a mental barrier of her diagnosis which inhibits her from  taking her ART every day. She was not taking her Biktarvy before her admission but states that they restarted it at [facility] and she has been taking it every single day since then and while in the hospital.  She takes it at 630pm with dinner to help with nausea.  She states that she is now ready to take this daily and realizes the severity of her disease and what can happen if left untreated. She states she would rather have her life than the alternative.  She has a grand baby due on Christmas Day that she wants to be around for.  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  She did finish her Valtrex Rx for her genital lesion that [facility] prescribed. Zakhia states that it has resolved and is no longer an issue. She continues to take her Bactrim liquid MWF as well. Per instructions from [facility], patient will need maintenance therapy for CMV retinitis with 450 mg PO daily after her induction therapy. She will need this until she is at least undetectable and her CD4 is >100 for 3-6 months. I explained this to her and that she will need to take it for awhile most likely. I also explained her AIDS status to her and the difference between AIDS and HIV. She states that she never knew she had AIDS and I told her that her CD4 count indicated she has had it for years. I will see her back in 2 weeks to initiate maintenance therapy and have her see Dr. Megan Salon in 1 month.  Will check CBC and CMET today to assess her blood counts and renal function.  Will also check her HIV viral load and CD4  count.    Plan: - Continue Biktarvy PO once daily - Continue valganciclovir 450 mg PO BID x 14 days (21 days total) - Continue eye drops and ointment - Continue Bactrim suspension MWF - CBC, CMET, HIV RNA, CD4 today - F/u with me 7/14 at 1045am  - F/u with Dr. Megan Salon 7/23 at 930am  Julie Nay L. Rayhana Slider, PharmD, BCIDP, AAHIVP, [place] [facility] 04/17/2019, 3:33 PM

## 2019-04-18 LAB — T-HELPER CELL (CD4) - (RCID CLINIC ONLY)
CD4 % Helper T Cell: 10 % — ABNORMAL LOW (ref 33–65)
CD4 T Cell Abs: 37 /uL — ABNORMAL LOW (ref 400–1790)

## 2019-04-23 ENCOUNTER — Encounter: Payer: Self-pay | Admitting: Internal Medicine

## 2019-04-23 ENCOUNTER — Telehealth: Payer: Self-pay | Admitting: *Deleted

## 2019-04-23 LAB — COMPREHENSIVE METABOLIC PANEL
AG Ratio: 0.9 (calc) — ABNORMAL LOW (ref 1.0–2.5)
ALT: 23 U/L (ref 6–29)
AST: 29 U/L (ref 10–35)
Albumin: 3.6 g/dL (ref 3.6–5.1)
Alkaline phosphatase (APISO): 48 U/L (ref 31–125)
BUN: 24 mg/dL (ref 7–25)
CO2: 26 mmol/L (ref 20–32)
Calcium: 9.2 mg/dL (ref 8.6–10.2)
Chloride: 105 mmol/L (ref 98–110)
Creat: 1.01 mg/dL (ref 0.50–1.10)
Globulin: 4.2 g/dL (calc) — ABNORMAL HIGH (ref 1.9–3.7)
Glucose, Bld: 85 mg/dL (ref 65–99)
Potassium: 4.5 mmol/L (ref 3.5–5.3)
Sodium: 137 mmol/L (ref 135–146)
Total Bilirubin: 0.3 mg/dL (ref 0.2–1.2)
Total Protein: 7.8 g/dL (ref 6.1–8.1)

## 2019-04-23 LAB — CBC
HCT: 27.8 % — ABNORMAL LOW (ref 35.0–45.0)
Hemoglobin: 9.3 g/dL — ABNORMAL LOW (ref 11.7–15.5)
MCH: 31.3 pg (ref 27.0–33.0)
MCHC: 33.5 g/dL (ref 32.0–36.0)
MCV: 93.6 fL (ref 80.0–100.0)
MPV: 11.7 fL (ref 7.5–12.5)
Platelets: 295 10*3/uL (ref 140–400)
RBC: 2.97 10*6/uL — ABNORMAL LOW (ref 3.80–5.10)
RDW: 14.9 % (ref 11.0–15.0)
WBC: 4.4 10*3/uL (ref 3.8–10.8)

## 2019-04-23 LAB — HIV-1 RNA QUANT-NO REFLEX-BLD
HIV 1 RNA Quant: 3460 copies/mL — ABNORMAL HIGH
HIV-1 RNA Quant, Log: 3.54 Log copies/mL — ABNORMAL HIGH

## 2019-04-23 NOTE — Telephone Encounter (Signed)
Patient requesting back to work letter starting she would like to return Tuesday 04/29/2019 (for one job) and Wednesday 7/15 for her other job.  Please do not state Infectious Disease anywhere, only "[place]." She will pick it up here at the office. Landis Gandy, RN

## 2019-04-23 NOTE — Telephone Encounter (Signed)
-----   Message from Krystal Bowen sent at 04/23/2019 10:24 AM EDT ----- Regarding: Back to work letter Patient called to confirm appointments and also requested a letter to go back to work. Call back number [phone].

## 2019-05-06 ENCOUNTER — Other Ambulatory Visit: Payer: Self-pay

## 2019-05-06 ENCOUNTER — Ambulatory Visit: Payer: Self-pay

## 2019-05-06 ENCOUNTER — Ambulatory Visit: Payer: Medicaid Other

## 2019-05-06 ENCOUNTER — Ambulatory Visit (INDEPENDENT_AMBULATORY_CARE_PROVIDER_SITE_OTHER): Payer: Self-pay | Admitting: Pharmacist

## 2019-05-06 DIAGNOSIS — H3090 Unspecified chorioretinal inflammation, unspecified eye: Secondary | ICD-10-CM

## 2019-05-06 DIAGNOSIS — B259 Cytomegaloviral disease, unspecified: Secondary | ICD-10-CM

## 2019-05-06 DIAGNOSIS — B20 Human immunodeficiency virus [HIV] disease: Secondary | ICD-10-CM

## 2019-05-06 MED ORDER — BIKTARVY 50-200-25 MG PO TABS: 1.0000 | ORAL_TABLET | Freq: Every day | ORAL | 6 refills | Status: DC

## 2019-05-06 NOTE — Progress Notes (Signed)
HPI: Krystal Bowen is a 48 y.o. female who presents to the [place] clinic for HIV follow-up.  Patient Active Problem List   Diagnosis Date Noted  . Blurred vision, right eye 02/18/2019  . Vulvar lesion 01/22/2019  . At risk for opportunistic infections 01/10/2019  . Diarrhea 10/18/2018  . Noncompliance w/medication treatment due to intermit use of medication 10/18/2018  . AIDS (acquired immune deficiency syndrome) ([place]) 10/18/2018  . HIV infection ([place])   . Tachycardia   . Depression 01/01/2018  . Peripheral neuropathy 02/13/2017  . HIV disease ([place]) 02/12/2017  . HTN (hypertension) 02/12/2017  . Normocytic anemia 02/12/2017  . Dyslipidemia 02/12/2017  . History of shingles 02/12/2017    Patient's Medications  New Prescriptions   No medications on file  Previous Medications   ATROPINE 1 % OPHTHALMIC SOLUTION    Place 1 drop into the right eye 2 times daily for 30 days.   AZITHROMYCIN (ZITHROMAX) 600 MG TABLET    Take 2 tablets (1,200 mg total) by mouth once a week.   ERYTHROMYCIN OPHTHALMIC OINTMENT    Place into the right eye 2 times daily.   FLUCONAZOLE (DIFLUCAN) 200 MG TABLET    Take 1 tablet (200 mg total) by mouth daily.   GABAPENTIN (NEURONTIN) 600 MG TABLET    Take 300 mg by mouth as needed (pain).    HYDROCHLOROTHIAZIDE (HYDRODIURIL) 25 MG TABLET    TAKE 1 TABLET(25 MG) BY MOUTH DAILY   NYSTATIN (MYCOSTATIN) 100000 UNIT/ML SUSPENSION    TK 4 MLS PO QID   PREDNISOLONE ACETATE (PRED FORTE) 1 % OPHTHALMIC SUSPENSION    Place 1 drop into the right eye every 2 hours.   SULFAMETHOXAZOLE-TRIMETHOPRIM (BACTRIM,SEPTRA) 200-40 MG/5ML SUSPENSION    Take 20 mLs by mouth daily.   SUMATRIPTAN (IMITREX) 50 MG TABLET       VALACYCLOVIR (VALTREX) 1000 MG TABLET    TAKE 1 TABLET(1000 MG) BY MOUTH TWICE DAILY FOR 10 DAYS   VALGANCICLOVIR (VALCYTE) 450 MG TABLET    TK 1 T PO BID FOR 14 DAYS  Modified Medications   Modified Medication Previous Medication   BICTEGRAVIR-EMTRICITABINE-TENOFOVIR AF (BIKTARVY) 50-200-25 MG TABS TABLET BIKTARVY 50-200-25 MG TABS tablet      Take 1 tablet by mouth daily.    TAKE 1 TABLET BY MOUTH DAILY  Discontinued Medications   No medications on file    Allergies: No Known Allergies  Past Medical History: Past Medical History:  Diagnosis Date  . Anemia   . HIV infection ([place])   . Hyperlipidemia   . Hypertension   . Neuropathy     Social History: Social History   Socioeconomic History  . Marital status: Legally Separated    Spouse name: Not on file  . Number of children: 4  . Years of education: Not on file  . Highest education level: Not on file  Occupational History  . 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 Financial resource strain: Not on file  . Food insecurity    Worry: Not on file    Inability: Not on file  . Transportation needs    Medical: Not on file    Non-medical: Not on file  Tobacco Use  . Smoking status: Never Smoker  . Smokeless tobacco: Never Used  Substance and Sexual Activity  . Alcohol use: Not Currently    Comment: social  . Drug use: No  . Sexual  activity: Not Currently    Partners: Male    Birth control/protection: Condom  Lifestyle  . Physical activity    Days per week: Not on file    Minutes per session: Not on file  . Stress: Not on file  Relationships  . Social Musicianconnections    Talks on phone: Not on file    Gets together: Not on file    Attends religious service: Not on file    Active member of club or organization: Not on file    Attends meetings of clubs or organizations: Not on file    Relationship status: Not on file  Other Topics Concern  . Not on file  Social History Narrative  . Not on file    Labs: Lab Results  Component Value Date   HIV1RNAQUANT 3,460 (H) 04/17/2019   HIV1RNAQUANT 46,400 (H) 02/18/2019   HIV1RNAQUANT 238,000 (H) 01/09/2019   CD4TABS 57 (L) 05/06/2019   CD4TABS  37 (L) 04/17/2019   CD4TABS <35 (L) 02/18/2019    RPR and STI Lab Results  Component Value Date   LABRPR NON REAC 01/30/2017    STI Results GC CT  01/21/2019 Negative Negative  03/29/2009 - POSITIVERTFDELIM(NOTE)  Testing performed using the BD Probetec ET Chlamydia trachomatis and Neisseria gonorrhea amplified DNA assay.(A)    Hepatitis B Lab Results  Component Value Date   HEPBSAB POS (A) 01/30/2017   HEPBSAG NEGATIVE 01/30/2017   HEPBCAB NON REACTIVE 01/30/2017   Hepatitis C No results found for: HEPCAB, HCVRNAPCRQN Hepatitis A Lab Results  Component Value Date   HAV NON REACTIVE 01/30/2017   Lipids: Lab Results  Component Value Date   CHOL 162 01/30/2017   TRIG 394 (H) 01/30/2017   HDL 25 (L) 01/30/2017   CHOLHDL 6.5 (H) 01/30/2017   VLDL 79 (H) 01/30/2017   LDLCALC 58 01/30/2017    Current HIV Regimen: Biktarvy  Assessment: Krystal Bowen is here today for her 2 week follow-up for her HIV infection and CMV retinitis. I saw her at the end of June after she was discharged from the hospital and diagnosed with CMV retinitis. She has a history of non-adherence to her ART and has always struggled mentally with taking a pill every day.  She has completed her 21 days of Valcyte BID but tells me today that [facility] continue her on the BID regimen.  I was planning on sending in the maintenance dose of Valcyte (450 mg PO daily) but will hold off since she promises that she is continuing to take it as they told her to. Her CrCl is right at 50 ml/min and the cut off for 900 mg is 50 ml/min.  Will let her continue the 450 mg PO BID for now and [facility].  She still insists that she is taking her Biktarvy, Bactrim, Valcyte, and all of her eye drops and ointments every day. Her HIV viral load went from 46,400 to 3,460 when I last checked at her appointment in June.  Her CD4 count is still quite low and I bet it will take quite awhile for  her to get complete CD4 count reconstitution.   Her WBC and platelet count was normal at 4.4 when checked on 6/25 but will recheck today.  Her SCr is trending down to normal and was 1.01. I continued to encourage her to stay on track and continue taking all of these medications daily.  She will need to be  on Valcyte for quite a long time for secondary prophylaxis. I will check labs again today in anticipation for her visit with Dr. Orvan Falconerampbell next week.  Plan: - Continue Biktarvy PO once daily - Continue valganciclovir 450 mg PO BID until [facility] dose - Continue eye drops and ointment - Continue Bactrim suspension MWF - CBC w diff, CMET, HIV RNA, CD4 today - F/u with Dr. Orvan Falconerampbell 7/23 at 930am  Antwuan Eckley L. Krizia Flight, PharmD, BCIDP, AAHIVP, CPP Infectious Diseases Clinical Pharmacist [facility] 05/08/2019, 11:20 AM

## 2019-05-07 LAB — T-HELPER CELL (CD4) - (RCID CLINIC ONLY)
CD4 % Helper T Cell: 17 % — ABNORMAL LOW (ref 33–65)
CD4 T Cell Abs: 57 /uL — ABNORMAL LOW (ref 400–1790)

## 2019-05-10 LAB — COMPREHENSIVE METABOLIC PANEL
AG Ratio: 0.8 (calc) — ABNORMAL LOW (ref 1.0–2.5)
ALT: 6 U/L (ref 6–29)
AST: 14 U/L (ref 10–35)
Albumin: 3.7 g/dL (ref 3.6–5.1)
Alkaline phosphatase (APISO): 50 U/L (ref 31–125)
BUN: 24 mg/dL (ref 7–25)
CO2: 27 mmol/L (ref 20–32)
Calcium: 9.5 mg/dL (ref 8.6–10.2)
Chloride: 105 mmol/L (ref 98–110)
Creat: 1.03 mg/dL (ref 0.50–1.10)
Globulin: 4.6 g/dL (calc) — ABNORMAL HIGH (ref 1.9–3.7)
Glucose, Bld: 118 mg/dL — ABNORMAL HIGH (ref 65–99)
Potassium: 3.6 mmol/L (ref 3.5–5.3)
Sodium: 139 mmol/L (ref 135–146)
Total Bilirubin: 0.2 mg/dL (ref 0.2–1.2)
Total Protein: 8.3 g/dL — ABNORMAL HIGH (ref 6.1–8.1)

## 2019-05-10 LAB — CBC WITH DIFFERENTIAL/PLATELET
Absolute Monocytes: 470 cells/uL (ref 200–950)
Basophils Absolute: 41 cells/uL (ref 0–200)
Basophils Relative: 1.5 %
Eosinophils Absolute: 70 cells/uL (ref 15–500)
Eosinophils Relative: 2.6 %
HCT: 30.6 % — ABNORMAL LOW (ref 35.0–45.0)
Hemoglobin: 10 g/dL — ABNORMAL LOW (ref 11.7–15.5)
Lymphs Abs: 359 cells/uL — ABNORMAL LOW (ref 850–3900)
MCH: 32.1 pg (ref 27.0–33.0)
MCHC: 32.7 g/dL (ref 32.0–36.0)
MCV: 98.1 fL (ref 80.0–100.0)
MPV: 10.9 fL (ref 7.5–12.5)
Monocytes Relative: 17.4 %
Neutro Abs: 1760 cells/uL (ref 1500–7800)
Neutrophils Relative %: 65.2 %
Platelets: 354 10*3/uL (ref 140–400)
RBC: 3.12 10*6/uL — ABNORMAL LOW (ref 3.80–5.10)
RDW: 14.7 % (ref 11.0–15.0)
Total Lymphocyte: 13.3 %
WBC: 2.7 10*3/uL — ABNORMAL LOW (ref 3.8–10.8)

## 2019-05-10 LAB — HIV-1 RNA QUANT-NO REFLEX-BLD
HIV 1 RNA Quant: 581 copies/mL — ABNORMAL HIGH
HIV-1 RNA Quant, Log: 2.76 Log copies/mL — ABNORMAL HIGH

## 2019-05-13 ENCOUNTER — Encounter: Payer: Self-pay | Admitting: Internal Medicine

## 2019-05-15 ENCOUNTER — Other Ambulatory Visit: Payer: Self-pay

## 2019-05-15 ENCOUNTER — Ambulatory Visit (INDEPENDENT_AMBULATORY_CARE_PROVIDER_SITE_OTHER): Payer: Self-pay | Admitting: Internal Medicine

## 2019-05-15 ENCOUNTER — Encounter: Payer: Self-pay | Admitting: Internal Medicine

## 2019-05-15 DIAGNOSIS — B20 Human immunodeficiency virus [HIV] disease: Secondary | ICD-10-CM

## 2019-05-15 DIAGNOSIS — F3340 Major depressive disorder, recurrent, in remission, unspecified: Secondary | ICD-10-CM

## 2019-05-15 DIAGNOSIS — H3090 Unspecified chorioretinal inflammation, unspecified eye: Secondary | ICD-10-CM

## 2019-05-15 DIAGNOSIS — B259 Cytomegaloviral disease, unspecified: Secondary | ICD-10-CM

## 2019-05-15 MED ORDER — VALGANCICLOVIR HCL 450 MG PO TABS: 450.0000 mg | ORAL_TABLET | Freq: Every day | ORAL | 5 refills | Status: DC

## 2019-05-15 NOTE — Assessment & Plan Note (Signed)
Her depression is in remission. 

## 2019-05-15 NOTE — Assessment & Plan Note (Signed)
Her viral load is not suppressed completely but is certainly trending in the right direction and she is beginning to have some CD4 reconstitution.  That sounds like her adherence has been much improved recently, especially after developing HIV related to CMV retinitis.  I encouraged her to take her Biktarvy in the morning.  She is also been given a pill canister and that she can use to take a few doses with her when she is away from home at work in case she forgets to take it in the morning.  She will follow-up in 4 to 6 weeks.

## 2019-05-15 NOTE — Assessment & Plan Note (Signed)
She has some improvement in her right eye vision after starting valganciclovir.  She has tolerated it well other than some leukopenia.  We will change her to 450 mg once daily.

## 2019-05-15 NOTE — Progress Notes (Signed)
Patient Active Problem List   Diagnosis Date Noted  . HIV disease ([place]) 02/12/2017    Priority: High  . 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 Vulvar lesion 01/22/2019  . Noncompliance w/medication treatment due to intermit use of medication 10/18/2018  . Depression 01/01/2018  . Peripheral neuropathy 02/13/2017  . HTN (hypertension) 02/12/2017  . Normocytic anemia 02/12/2017  . Dyslipidemia 02/12/2017  . History of shingles 02/12/2017    Patient's Medications  New Prescriptions   No medications on file  Previous Medications   BICTEGRAVIR-EMTRICITABINE-TENOFOVIR AF (BIKTARVY) 50-200-25 MG TABS TABLET    Take 1 tablet by mouth daily.   ERYTHROMYCIN OPHTHALMIC OINTMENT    Place into the right eye 2 times daily.   GABAPENTIN (NEURONTIN) 600 MG TABLET    Take 300 mg by mouth as needed (pain).    HYDROCHLOROTHIAZIDE (HYDRODIURIL) 25 MG TABLET    TAKE 1 TABLET(25 MG) BY MOUTH DAILY   PREDNISOLONE ACETATE (PRED FORTE) 1 % OPHTHALMIC SUSPENSION    Place 1 drop into the right eye every 2 hours.   SULFAMETHOXAZOLE-TRIMETHOPRIM (BACTRIM,SEPTRA) 200-40 MG/5ML SUSPENSION    Take 20 mLs by mouth daily.  Modified Medications   Modified Medication Previous Medication   VALGANCICLOVIR (VALCYTE) 450 MG TABLET valGANciclovir (VALCYTE) 450 MG tablet      Take 1 tablet (450 mg total) by mouth daily.    TK 1 T PO BID FOR 14 DAYS  Discontinued Medications   No medications on file    Subjective: Cheresa is in for her routine HIV follow-up visit.  She was admitted to [facility] from 6/15-22 for worsening vision in her right eye.  She had ophthalmologic findings compatible with CMV retinitis and this was confirmed by a positive CMV PCR from vitreous fluid.  She started induction therapy with valganciclovir 450 mg twice daily.  She was supposed to take this dose for 3 weeks then changed to once daily dosing.  She is still on it twice daily.  She says that the loss of vision was a "real  eye-opener".  She says that she has been much more diligent taking her Biktarvy and other medications.  She says that she varies the time of day that she takes her medications based on her work schedule.  She recalls missing 1 dose of Biktarvy since she was discharged from the hospital.  That occurred when she came home from work and fell asleep before taking it.  She is using her pillbox.  She is feeling better.  Appetite is improved and she has gained some weight.  She has noted a little bit of improvement in her right eye vision.  She notes improved color vision and has had some improved clarity in her peripheral vision.  Her central vision remains very poor.  She feels that her left eye vision is normal.  Review of Systems: Review of Systems  Constitutional: Negative for chills, diaphoresis, fever, malaise/fatigue and weight loss.  HENT: Negative for congestion and sore throat.   Eyes:       As noted in HPI.  Respiratory: Negative for cough, sputum production and shortness of breath.   Cardiovascular: Negative for chest pain.  Gastrointestinal: Negative for abdominal pain, diarrhea, heartburn, nausea and vomiting.  Genitourinary: Negative for dysuria and frequency.  Musculoskeletal: Negative for joint pain and myalgias.  Skin: Negative for rash.  Neurological: Negative for dizziness and headaches.  Psychiatric/Behavioral: Negative for depression and substance abuse. The patient is  not nervous/anxious.     Past Medical History:  Diagnosis Date  . Anemia   . HIV infection (H[facility])   . Hyperlipidemia   . Hypertension   . Neuropathy     Social History   Tobacco Use  . Smoking status: Never Smoker  . Smokeless tobacco: Never Used  Substance Use Topics  . Alcohol use: Not Currently    Comment: social  . Drug use: No    Family History  Problem Relation Age of Onset  . Heart attack Mother   . Stroke Mother   . Hyperlipidemia Mother   . Cancer Father        Leukemia   .  Hyperlipidemia Maternal Grandmother   . Alzheimer's disease Maternal Grandmother   . Heart attack Maternal Grandfather     No Known Allergies  Health Maintenance  Topic Date Due  . INFLUENZA VACCINE  05/24/2019  . PAP SMEAR-Modifier  05/03/2020  . TETANUS/TDAP  09/09/2020  . HIV Screening  Completed    Objective:  Vitals:   05/15/19 0938  BP: (!) 144/97  Pulse: 91  Temp: 98 F (36.7 C)  Weight: 109 lb (49.4 kg)   Body mass index is 20.6 kg/m.  Physical Exam Constitutional:      Comments: She has gained 7 pounds in the past 4 months.  HENT:     Mouth/Throat:     Pharynx: No oropharyngeal exudate.  Eyes:     Pupils: Pupils are equal, round, and reactive to light.     Comments: She is wearing sunglasses due to mild photophobia.  Cardiovascular:     Rate and Rhythm: Normal rate and regular rhythm.     Heart sounds: No murmur.  Pulmonary:     Effort: Pulmonary effort is normal.     Breath sounds: Normal breath sounds.  Abdominal:     Palpations: Abdomen is soft.     Tenderness: There is no abdominal tenderness.  Skin:    Findings: No rash.  Psychiatric:        Mood and Affect: Mood normal.     Lab Results Lab Results  Component Value Date   WBC 2.7 (L) 05/06/2019   HGB 10.0 (L) 05/06/2019   HCT 30.6 (L) 05/06/2019   MCV 98.1 05/06/2019   PLT 354 05/06/2019    Lab Results  Component Value Date   CREATININE 1.03 05/06/2019   BUN 24 05/06/2019   NA 139 05/06/2019   K 3.6 05/06/2019   CL 105 05/06/2019   CO2 27 05/06/2019    Lab Results  Component Value Date   ALT 6 05/06/2019   AST 14 05/06/2019   ALKPHOS 77 05/07/2017   BILITOT 0.2 05/06/2019    Lab Results  Component Value Date   CHOL 162 01/30/2017   HDL 25 (L) 01/30/2017   LDLCALC 58 01/30/2017   TRIG 394 (H) 01/30/2017   CHOLHDL 6.5 (H) 01/30/2017   Lab Results  Component Value Date   LABRPR NON REAC 01/30/2017   HIV 1 RNA Quant (copies/mL)  Date Value  05/06/2019 581 (H)   04/17/2019 3,460 (H)  02/18/2019 46,400 (H)   CD4 T Cell Abs (/uL)  Date Value  05/06/2019 57 (L)  04/17/2019 37 (L)  02/18/2019 <35 (L)     Problem List Items Addressed This Visit      High   HIV disease (HCC)    Her viral load is not suppressed completely but is certainly trending in the right direction and  she is beginning to have some CD4 reconstitution.  That sounds like her adherence has been much improved recently, especially after developing HIV related to CMV retinitis.  I encouraged her to take her Biktarvy in the morning.  She is also been given a pill canister and that she can use to take a few doses with her when she is away from home at work in case she forgets to take it in the morning.  She will follow-up in 4 to 6 weeks.      Relevant Medications   valGANciclovir (VALCYTE) 450 MG tablet     Unprioritized   Depression    Her depression is in remission.      CMV retinitis (HCC)    She has some improvement in her right eye vision after starting valganciclovir.  She has tolerated it well other than some leukopenia.  We will change her to 450 mg once daily.      Relevant Medications   valGANciclovir (VALCYTE) 450 MG tablet        Cliffton AstersJohn Terrius Gentile, MD [facility] [phone] pager   [phone] cell 05/15/2019, 11:41 AM

## 2019-06-26 ENCOUNTER — Ambulatory Visit: Payer: Self-pay | Admitting: Pharmacist

## 2019-07-14 ENCOUNTER — Ambulatory Visit (INDEPENDENT_AMBULATORY_CARE_PROVIDER_SITE_OTHER): Payer: Self-pay | Admitting: Pharmacist

## 2019-07-14 ENCOUNTER — Other Ambulatory Visit: Payer: Self-pay

## 2019-07-14 DIAGNOSIS — Z9189 Other specified personal risk factors, not elsewhere classified: Secondary | ICD-10-CM

## 2019-07-14 DIAGNOSIS — B20 Human immunodeficiency virus [HIV] disease: Secondary | ICD-10-CM

## 2019-07-14 DIAGNOSIS — H3090 Unspecified chorioretinal inflammation, unspecified eye: Secondary | ICD-10-CM

## 2019-07-14 DIAGNOSIS — B259 Cytomegaloviral disease, unspecified: Secondary | ICD-10-CM

## 2019-07-14 NOTE — Progress Notes (Signed)
HPI: Krystal CascoCarol D Bowen is a 48 y.o. female who presents to the [facility] clinic for HIV follow-up.  Patient Active Problem List   Diagnosis Date Noted  . 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 Vulvar lesion 01/22/2019  . Noncompliance w/medication treatment due to intermit use of medication 10/18/2018  . Depression 01/01/2018  . Peripheral neuropathy 02/13/2017  . HIV disease ([facility]) 02/12/2017  . HTN (hypertension) 02/12/2017  . Normocytic anemia 02/12/2017  . Dyslipidemia 02/12/2017  . History of shingles 02/12/2017    Patient's Medications  New Prescriptions   No medications on file  Previous Medications   BICTEGRAVIR-EMTRICITABINE-TENOFOVIR AF (BIKTARVY) 50-200-25 MG TABS TABLET    Take 1 tablet by mouth daily.   ERYTHROMYCIN OPHTHALMIC OINTMENT    Place into the right eye 2 times daily.   GABAPENTIN (NEURONTIN) 600 MG TABLET    Take 300 mg by mouth as needed (pain).    HYDROCHLOROTHIAZIDE (HYDRODIURIL) 25 MG TABLET    TAKE 1 TABLET(25 MG) BY MOUTH DAILY   PREDNISOLONE ACETATE (PRED FORTE) 1 % OPHTHALMIC SUSPENSION    Place 1 drop into the right eye every 2 hours.   SULFAMETHOXAZOLE-TRIMETHOPRIM (BACTRIM,SEPTRA) 200-40 MG/5ML SUSPENSION    Take 20 mLs by mouth daily.   VALGANCICLOVIR (VALCYTE) 450 MG TABLET    Take 1 tablet (450 mg total) by mouth daily.  Modified Medications   No medications on file  Discontinued Medications   No medications on file    Allergies: No Known Allergies  Past Medical History: Past Medical History:  Diagnosis Date  . Anemia   . HIV infection ([facility])   . Hyperlipidemia   . Hypertension   . Neuropathy     Social History: Social History   Socioeconomic History  . Marital status: Legally Separated    Spouse name: Not on file  . Number of children: 4  . Years of education: Not on file  . Highest education level: Not on file  Occupational History  . 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 Financial resource strain: Not on file  . Food insecurity    Worry: Not on file    Inability: Not on file  . Transportation needs    Medical: Not on file    Non-medical: Not on file  Tobacco Use  . Smoking status: Never Smoker  . Smokeless tobacco: Never Used  Substance and Sexual Activity  . Alcohol use: Not Currently    Comment: social  . Drug use: No  . Sexual activity: Not Currently    Partners: Male    Birth control/protection: Condom  Lifestyle  . Physical activity    Days per week: Not on file    Minutes per session: Not on file  . Stress: Not on file  Relationships  . Social Musicianconnections    Talks on phone: Not on file    Gets together: Not on file    Attends religious service: Not on file    Active member of club or organization: Not on file    Attends meetings of clubs or organizations: Not on file    Relationship status: Not on file  Other Topics Concern  . Not on file  Social History Narrative  . Not on file    Labs: Lab Results  Component Value Date   HIV1RNAQUANT 581 (H) 05/06/2019   HIV1RNAQUANT 3,460 (H) 04/17/2019   HIV1RNAQUANT 46,400 (H) 02/18/2019  CD4TABS 57 (L) 05/06/2019   CD4TABS 37 (L) 04/17/2019   CD4TABS <35 (L) 02/18/2019    RPR and STI Lab Results  Component Value Date   LABRPR NON REAC 01/30/2017    STI Results GC CT  01/21/2019 Negative Negative  03/29/2009 - POSITIVERTFDELIM(NOTE)  Testing performed using the BD Probetec ET Chlamydia trachomatis and Neisseria gonorrhea amplified DNA assay.(A)    Hepatitis B Lab Results  Component Value Date   HEPBSAB POS (A) 01/30/2017   HEPBSAG NEGATIVE 01/30/2017   HEPBCAB NON REACTIVE 01/30/2017   Hepatitis C No results found for: HEPCAB, HCVRNAPCRQN Hepatitis A Lab Results  Component Value Date   HAV NON REACTIVE 01/30/2017   Lipids: Lab Results  Component Value Date   CHOL 162 01/30/2017   TRIG 394 (H) 01/30/2017   HDL 25 (L) 01/30/2017   CHOLHDL 6.5 (H)  01/30/2017   VLDL 79 (H) 01/30/2017   LDLCALC 58 01/30/2017    Current HIV Regimen: Biktarvy  Assessment: Krystal Bowen is here today to follow-up for HIV and recent CMV retinitis. She last saw Dr. Megan Salon in July and was doing better about taking her medications every day.  He most recent HIV viral load was down to 581 and her CD4 count was up to 57.  We discussed her numbers, and I explained that it may take a bit of time for her CD4 to return to a non-AIDS level but that taking her Biktarvy every day was very important.  She did switch to taking it every morning and feels like this has helped her even more with her adherence. She doesn't think she's missed any doses since seeing Dr. Megan Salon. She is still taking Bactrim suspension, but it sounded like sometimes she has trouble swallowing the liquid as it "gets stuck" in her throat.  She states that she is doing the best that she can.  I encouraged her to continue taking it every day so she does not get pneumonia, especially with flu season coming up.    She is no longer taking the erythromycin ointment.  She is only using the prednisolone eye suspension and is still taking Valcyte 450 mg BID. She did have recent eye surgery for her detached retina.  She is pleased with how her eye exam went last Friday. She states that in her left eye, she could read and repeat the eye chart with minimal corrections.  Her right eye continues to be blurry, but she thinks it is getting better with time.  Will check labs today and have her see Dr. Megan Salon in 3 months. She will call me with any issues or concerns in the meantime.  Plan: - Continue Biktarvy PO once daily - Continue Bactrim for PCP ppx - HIV viral load and CD4 today - F/u with Dr. Megan Salon 12/10 at 1030am  Jameek Bruntz L. Kanani Mowbray, PharmD, BCIDP, AAHIVP, [facility] 07/14/2019, 4:43 PM

## 2019-07-16 LAB — HELPER T-LYMPH-CD4 (ARMC ONLY)
% CD 4 Pos. Lymph.: 15.6 % — ABNORMAL LOW (ref 30.8–58.5)
Absolute CD 4 Helper: 62 /uL — ABNORMAL LOW (ref 359–1519)
Basophils Absolute: 0 10*3/uL (ref 0.0–0.2)
Basos: 0 %
EOS (ABSOLUTE): 0.1 10*3/uL (ref 0.0–0.4)
Eos: 4 %
Hematocrit: 33.4 % — ABNORMAL LOW (ref 34.0–46.6)
Hemoglobin: 11 g/dL — ABNORMAL LOW (ref 11.1–15.9)
Immature Grans (Abs): 0 10*3/uL (ref 0.0–0.1)
Immature Granulocytes: 0 %
Lymphocytes Absolute: 0.4 10*3/uL — ABNORMAL LOW (ref 0.7–3.1)
Lymphs: 15 %
MCH: 32.1 pg (ref 26.6–33.0)
MCHC: 32.9 g/dL (ref 31.5–35.7)
MCV: 97 fL (ref 79–97)
Monocytes Absolute: 0.4 10*3/uL (ref 0.1–0.9)
Monocytes: 13 %
Neutrophils Absolute: 2 10*3/uL (ref 1.4–7.0)
Neutrophils: 68 %
Platelets: 257 10*3/uL (ref 150–450)
RBC: 3.43 x10E6/uL — ABNORMAL LOW (ref 3.77–5.28)
RDW: 13.3 % (ref 11.7–15.4)
WBC: 3 10*3/uL — ABNORMAL LOW (ref 3.4–10.8)

## 2019-07-18 LAB — HIV-1 RNA QUANT-NO REFLEX-BLD
HIV 1 RNA Quant: 100000 copies/mL — ABNORMAL HIGH
HIV-1 RNA Quant, Log: 5 Log copies/mL — ABNORMAL HIGH

## 2019-10-01 ENCOUNTER — Telehealth: Payer: Self-pay

## 2019-10-01 NOTE — Telephone Encounter (Signed)
COVID-19 Pre-Screening Questions:10/01/19  Do you currently have a fever (>100 F), chills or unexplained body aches? NO  Are you currently experiencing new cough, shortness of breath, sore throat, runny nose?NO .  Have you recently travelled outside the state of [place] in the last 14 days?NO .  Have you been in contact with someone that is currently pending confirmation of Covid19 testing or has been confirmed to have the Covid19 virus?  NO  **If the patient answers NO to ALL questions -  advise the patient to please call the clinic before coming to the office should any symptoms develop.     

## 2019-10-02 ENCOUNTER — Ambulatory Visit (INDEPENDENT_AMBULATORY_CARE_PROVIDER_SITE_OTHER): Payer: Self-pay | Admitting: Internal Medicine

## 2019-10-02 ENCOUNTER — Encounter: Payer: Self-pay | Admitting: Internal Medicine

## 2019-10-02 ENCOUNTER — Other Ambulatory Visit: Payer: Self-pay

## 2019-10-02 DIAGNOSIS — Z23 Encounter for immunization: Secondary | ICD-10-CM

## 2019-10-02 DIAGNOSIS — B259 Cytomegaloviral disease, unspecified: Secondary | ICD-10-CM

## 2019-10-02 DIAGNOSIS — F3340 Major depressive disorder, recurrent, in remission, unspecified: Secondary | ICD-10-CM

## 2019-10-02 DIAGNOSIS — B20 Human immunodeficiency virus [HIV] disease: Secondary | ICD-10-CM

## 2019-10-02 DIAGNOSIS — H3090 Unspecified chorioretinal inflammation, unspecified eye: Secondary | ICD-10-CM

## 2019-10-02 NOTE — Assessment & Plan Note (Signed)
I told her that there is a strong likelihood that her retinitis will remain active if she does not control her HIV infection.  She will continue valganciclovir.

## 2019-10-02 NOTE — Progress Notes (Signed)
Patient Active Problem List   Diagnosis Date Noted  . 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 Depression 01/01/2018    Priority: High  . HIV disease ([place]) 02/12/2017    Priority: High  . Vulvar lesion 01/22/2019  . Noncompliance w/medication treatment due to intermit use of medication 10/18/2018  . Peripheral neuropathy 02/13/2017  . HTN (hypertension) 02/12/2017  . Normocytic anemia 02/12/2017  . Dyslipidemia 02/12/2017  . History of shingles 02/12/2017    Patient's Medications  New Prescriptions   No medications on file  Previous Medications   BICTEGRAVIR-EMTRICITABINE-TENOFOVIR AF (BIKTARVY) 50-200-25 MG TABS TABLET    Take 1 tablet by mouth daily.   ERYTHROMYCIN OPHTHALMIC OINTMENT    Place into the right eye 2 times daily.   GABAPENTIN (NEURONTIN) 600 MG TABLET    Take 300 mg by mouth as needed (pain).    HYDROCHLOROTHIAZIDE (HYDRODIURIL) 25 MG TABLET    TAKE 1 TABLET(25 MG) BY MOUTH DAILY   PREDNISOLONE ACETATE (PRED FORTE) 1 % OPHTHALMIC SUSPENSION    Place 1 drop into the right eye every 2 hours.   SULFAMETHOXAZOLE-TRIMETHOPRIM (BACTRIM,SEPTRA) 200-40 MG/5ML SUSPENSION    Take 20 mLs by mouth daily.   VALGANCICLOVIR (VALCYTE) 450 MG TABLET    Take 1 tablet (450 mg total) by mouth daily.  Modified Medications   No medications on file  Discontinued Medications   No medications on file    Subjective: Jayliah is in for her routine HIV follow-up visit.  She has been missing doses of her Biktarvy.  She is having trouble swallowing because of the bitter taste.  She is crushing the pill and mixing it with a teaspoon of applesauce.  She will sometimes wash it down with water or juice.  She does not have trouble swallowing her valganciclovir or trimethoprim sulfamethoxazole.  She has been feeling more depressed.  She is talking with her counselor on a regular basis and finds that this helps some.  Her vision has been stable recently.  Review of Systems:  Review of Systems  Constitutional: Negative for fever and weight loss.  HENT: Positive for congestion. Negative for sore throat.   Eyes: Positive for blurred vision.  Respiratory: Negative for cough, sputum production and shortness of breath.   Cardiovascular: Negative for chest pain.  Gastrointestinal: Negative for abdominal pain, diarrhea, nausea and vomiting.  Skin: Negative for rash.  Neurological: Negative for headaches.  Psychiatric/Behavioral: Positive for depression. The patient is not nervous/anxious.     Past Medical History:  Diagnosis Date  . Anemia   . HIV infection ([place])   . Hyperlipidemia   . Hypertension   . Neuropathy     Social History   Tobacco Use  . Smoking status: Never Smoker  . Smokeless tobacco: Never Used  Substance Use Topics  . Alcohol use: Not Currently    Comment: social  . Drug use: No    Family History  Problem Relation Age of Onset  . Heart attack Mother   . Stroke Mother   . Hyperlipidemia Mother   . Cancer Father        Leukemia   . Hyperlipidemia Maternal Grandmother   . Alzheimer's disease Maternal Grandmother   . Heart attack Maternal Grandfather     No Known Allergies  Health Maintenance  Topic Date Due  . INFLUENZA VACCINE  05/24/2019  . PAP SMEAR-Modifier  05/03/2020  . TETANUS/TDAP  09/09/2020  . HIV  Screening  Completed    Objective:  Vitals:   10/02/19 1032  BP: 110/77  Pulse: 91  Weight: 108 lb (49 kg)   Body mass index is 20.41 kg/m.  Physical Exam Constitutional:      Comments: She is tearful when talking about her HIV infection.  Cardiovascular:     Rate and Rhythm: Normal rate and regular rhythm.     Heart sounds: No murmur.  Pulmonary:     Effort: Pulmonary effort is normal.     Breath sounds: Normal breath sounds.  Abdominal:     Palpations: Abdomen is soft.     Tenderness: There is no abdominal tenderness.  Musculoskeletal:        General: No swelling or tenderness.  Skin:    Findings:  No rash.  Neurological:     General: No focal deficit present.     Lab Results Lab Results  Component Value Date   WBC 3.0 (L) 07/14/2019   HGB 11.0 (L) 07/14/2019   HCT 33.4 (L) 07/14/2019   MCV 97 07/14/2019   PLT 257 07/14/2019    Lab Results  Component Value Date   CREATININE 1.03 05/06/2019   BUN 24 05/06/2019   NA 139 05/06/2019   K 3.6 05/06/2019   CL 105 05/06/2019   CO2 27 05/06/2019    Lab Results  Component Value Date   ALT 6 05/06/2019   AST 14 05/06/2019   ALKPHOS 77 05/07/2017   BILITOT 0.2 05/06/2019    Lab Results  Component Value Date   CHOL 162 01/30/2017   HDL 25 (L) 01/30/2017   LDLCALC 58 01/30/2017   TRIG 394 (H) 01/30/2017   CHOLHDL 6.5 (H) 01/30/2017   Lab Results  Component Value Date   LABRPR NON REAC 01/30/2017   HIV 1 RNA Quant (copies/mL)  Date Value  07/14/2019 100,000 (H)  05/06/2019 581 (H)  04/17/2019 3,460 (H)   CD4 T Cell Abs (/uL)  Date Value  05/06/2019 57 (L)  04/17/2019 37 (L)  02/18/2019 <35 (L)     Problem List Items Addressed This Visit      High   HIV disease (HCC)    Her infection remains poorly controlled because of sketchy adherence with Biktarvy.  I am going to ask our pharmacist to reach out to her.  I will check lab work today and see her back in 4 to 6 weeks.      Relevant Orders   T-helper cell (CD4)- ([facility] clinic only)   VL   Depression    I encouraged her to continue speaking with her counselor.      CMV retinitis (HCC)    I told her that there is a strong likelihood that her retinitis will remain active if she does not control her HIV infection.  She will continue valganciclovir.           [name], MD [facility] for Infectious Disease [facility] [phone] pager   [phone] cell 10/02/2019, 10:54 AM

## 2019-10-02 NOTE — Assessment & Plan Note (Signed)
I encouraged her to continue speaking with her counselor.

## 2019-10-02 NOTE — Assessment & Plan Note (Signed)
Her infection remains poorly controlled because of sketchy adherence with Biktarvy.  I am going to ask our pharmacist to reach out to her.  I will check lab work today and see her back in 4 to 6 weeks.

## 2019-10-03 ENCOUNTER — Ambulatory Visit: Payer: Self-pay

## 2019-10-03 LAB — T-HELPER CELL (CD4) - (RCID CLINIC ONLY)
CD4 % Helper T Cell: 5 % — ABNORMAL LOW (ref 33–65)
CD4 T Cell Abs: <35 /uL — ABNORMAL LOW (ref 400–1790)

## 2019-10-06 ENCOUNTER — Ambulatory Visit: Payer: Medicaid Other

## 2019-10-06 ENCOUNTER — Other Ambulatory Visit: Payer: Self-pay

## 2019-10-09 ENCOUNTER — Ambulatory Visit: Payer: Medicaid Other

## 2019-10-11 LAB — HIV-1 RNA QUANT-NO REFLEX-BLD
HIV 1 RNA Quant: 143000 copies/mL — ABNORMAL HIGH
HIV-1 RNA Quant, Log: 5.16 Log copies/mL — ABNORMAL HIGH

## 2019-10-21 ENCOUNTER — Telehealth: Payer: Self-pay | Admitting: Pharmacist

## 2019-10-21 NOTE — Telephone Encounter (Signed)
Called patient to follow up with her regarding her HIV medication and to see how she was doing. No answer, left HIPAA compliant VM. Will keep trying.

## 2019-11-05 ENCOUNTER — Telehealth: Payer: Self-pay

## 2019-11-05 NOTE — Telephone Encounter (Signed)
Krystal Bowen spoke with Okey Regal. She is still having issues with her Biktarvy. We can discuss next week before she comes in on Thursday 1/21 to see if there are any other options for her once we do more research on her regimen history.

## 2019-11-05 NOTE — Telephone Encounter (Signed)
Reached out to Ms Katich over the phone regarding her Susanne Borders and recent lab work from December 2020. I reminded Ms. Poppen of her appointments tomorrow (1/14) with our counselor and financial department and with Dr. Orvan Falconer on 1/21.   Patient continues to endorse issues with being able to swallow her Biktarvy as well as struggling with the reminder of her HIV status every time she takes her medication. I encouraged the patient to speak with our counselor about her anxiety and stressors. Ms Danielson stated she has tried crushing Biktarvy and taking it with everything (pudding, yogurt, apple sauce, juices) and nothing masks the bitter taste and usually comes right back up. Ms Schroyer inquired about "the 90 day injectable medication" as this was more appealing to her. I mentioned cabotegravir/rilipivirine but that it was not FDA approved and that other injectable medications available would still have to be taken with other oral medications (Trogarzo, Fuzeon).   Ms. Jenne seems motivated to find an alternative regimen that is easier for her to physically and psychologically administer as she understands that her HIV is less controlled than it was earlier this year and wants to be undetectable. Will discuss with Cassie and Dr. Orvan Falconer prior to her appointment on 1/21 to determine next steps.

## 2019-11-06 ENCOUNTER — Other Ambulatory Visit: Payer: Self-pay

## 2019-11-06 ENCOUNTER — Ambulatory Visit: Payer: Medicaid Other

## 2019-11-07 ENCOUNTER — Encounter: Payer: Self-pay | Admitting: Internal Medicine

## 2019-11-12 ENCOUNTER — Telehealth: Payer: Self-pay

## 2019-11-12 NOTE — Telephone Encounter (Signed)
COVID-19 Pre-Screening Questions:11/12/19  Do you currently have a fever (>100 F), chills or unexplained body aches? NO  Are you currently experiencing new cough, shortness of breath, sore throat, runny nose? NO .  Have you recently travelled outside the state of [place] in the last 14 days? NO .  Have you been in contact with someone that is currently pending confirmation of Covid19 testing or has been confirmed to have the Covid19 virus?  NO   **If the patient answers NO to ALL questions -  advise the patient to please call the clinic before coming to the office should any symptoms develop.     

## 2019-11-13 ENCOUNTER — Ambulatory Visit: Payer: Self-pay | Admitting: Internal Medicine

## 2019-11-13 ENCOUNTER — Encounter: Payer: Self-pay | Admitting: Internal Medicine

## 2019-11-13 ENCOUNTER — Other Ambulatory Visit: Payer: Self-pay

## 2019-11-13 DIAGNOSIS — B20 Human immunodeficiency virus [HIV] disease: Secondary | ICD-10-CM

## 2019-11-13 MED ORDER — ABACAVIR-DOLUTEGRAVIR-LAMIVUD 600-50-300 MG PO TABS: 1.0000 | ORAL_TABLET | Freq: Every day | ORAL | 11 refills | Status: DC

## 2019-11-13 NOTE — Assessment & Plan Note (Addendum)
Krystal Bowen continues to struggle with adherence.  Her infection is poorly controlled and her CD4 count is dangerously low.  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  She admits that she has trouble taking her pills and that a lot of the difficulty is emotional rather than the size or taste of Biktarvy.  She has reviewed clinic visits and HIV medication has a direct reminder that she is living with HIV rather than a tool that she can use to take control of her infection.  I suggested that she set a goal of not missing any doses of Biktarvy over the next week and come back in 1 week to meet with our pharmacy staff and with our counselor, Marylu Lund.  I will see her back in 1 month for lab work.  Addendum: She was then noted to have worsening gagging when observed crushing Biktarvy and taking it with chocolate pudding here in clinic today.  We have decided to switch Biktarvy to Triumeq to see if that can reduce the bitter taste and help her take her medication more consistently.

## 2019-11-13 NOTE — Addendum Note (Signed)
Addended by: [name] on: 11/13/2019 11:22 AM   Modules accepted: Orders

## 2019-11-13 NOTE — Progress Notes (Addendum)
Patient Active Problem List   Diagnosis Date Noted  . 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 Depression 01/01/2018    Priority: High  . HIV disease ([place]) 02/12/2017    Priority: High  . Vulvar lesion 01/22/2019  . Noncompliance w/medication treatment due to intermit use of medication 10/18/2018  . Peripheral neuropathy 02/13/2017  . HTN (hypertension) 02/12/2017  . Normocytic anemia 02/12/2017  . Dyslipidemia 02/12/2017  . History of shingles 02/12/2017    Patient's Medications  New Prescriptions   ABACAVIR-DOLUTEGRAVIR-LAMIVUDINE (TRIUMEQ) 600-50-300 MG TABLET    Take 1 tablet by mouth daily.  Previous Medications   ERYTHROMYCIN OPHTHALMIC OINTMENT    Place into the right eye 2 times daily.   GABAPENTIN (NEURONTIN) 600 MG TABLET    Take 300 mg by mouth as needed (pain).    HYDROCHLOROTHIAZIDE (HYDRODIURIL) 25 MG TABLET    TAKE 1 TABLET(25 MG) BY MOUTH DAILY   PREDNISOLONE ACETATE (PRED FORTE) 1 % OPHTHALMIC SUSPENSION    Place 1 drop into the right eye every 2 hours.   SULFAMETHOXAZOLE-TRIMETHOPRIM (BACTRIM,SEPTRA) 200-40 MG/5ML SUSPENSION    Take 20 mLs by mouth daily.   VALGANCICLOVIR (VALCYTE) 450 MG TABLET    Take 1 tablet (450 mg total) by mouth daily.  Modified Medications   No medications on file  Discontinued Medications   BICTEGRAVIR-EMTRICITABINE-TENOFOVIR AF (BIKTARVY) 50-200-25 MG TABS TABLET    Take 1 tablet by mouth daily.    Subjective: Krystal Bowen is in for her routine HIV follow-up visit.  She says that she continues to struggle with taking her Biktarvy on a regular basis.  She recently bought a pill crusher and started mixing the Biktarvy with a small cup of chocolate pudding.  She says that this does help with the bitter taste but admits that she also has an emotional problem taking medication for HIV.  She did meet with our counselor, Marcie Bal, last week and is scheduled to meet with her again next week.  She asked for help today and how she  can overcome the emotional obstacles so that she will be able to manage her infection.  She says that the recent development of CMV retinitis has really frightened her and led to some depression.  She is motivated to get better.  Review of Systems: Review of Systems  Eyes: Positive for blurred vision.  Psychiatric/Behavioral: Positive for depression.    Past Medical History:  Diagnosis Date  . Anemia   . HIV infection ([place])   . Hyperlipidemia   . Hypertension   . Neuropathy     Social History   Tobacco Use  . Smoking status: Never Smoker  . Smokeless tobacco: Never Used  Substance Use Topics  . Alcohol use: Not Currently    Comment: social  . Drug use: No    Family History  Problem Relation Age of Onset  . Heart attack Mother   . Stroke Mother   . Hyperlipidemia Mother   . Cancer Father        Leukemia   . Hyperlipidemia Maternal Grandmother   . Alzheimer's disease Maternal Grandmother   . Heart attack Maternal Grandfather     No Known Allergies  Health Maintenance  Topic Date Due  . PAP SMEAR-Modifier  05/03/2020  . TETANUS/TDAP  09/09/2020  . INFLUENZA VACCINE  Completed  . HIV Screening  Completed    Objective:  Vitals:   11/13/19 1030  BP: 108/78  Pulse: (!) 104  Temp: 98.1 F (36.7 C)  SpO2: 98%  Weight: 100 lb 9.6 oz (45.6 kg)  Height: 5\' 1"  (1.549 m)   Body mass index is 19.01 kg/m.  Physical Exam Constitutional:      Comments: She is talkative and concerned today.  She seems more engaged and willing to accept help than on previous visits.  Cardiovascular:     Rate and Rhythm: Normal rate and regular rhythm.     Heart sounds: No murmur.  Pulmonary:     Effort: Pulmonary effort is normal.     Breath sounds: Normal breath sounds.  Skin:    Findings: No rash.  Psychiatric:        Mood and Affect: Mood normal.     Lab Results Lab Results  Component Value Date   WBC 3.0 (L) 07/14/2019   HGB 11.0 (L) 07/14/2019   HCT 33.4 (L)  07/14/2019   MCV 97 07/14/2019   PLT 257 07/14/2019    Lab Results  Component Value Date   CREATININE 1.03 05/06/2019   BUN 24 05/06/2019   NA 139 05/06/2019   K 3.6 05/06/2019   CL 105 05/06/2019   CO2 27 05/06/2019    Lab Results  Component Value Date   ALT 6 05/06/2019   AST 14 05/06/2019   ALKPHOS 77 05/07/2017   BILITOT 0.2 05/06/2019    Lab Results  Component Value Date   CHOL 162 01/30/2017   HDL 25 (L) 01/30/2017   LDLCALC 58 01/30/2017   TRIG 394 (H) 01/30/2017   CHOLHDL 6.5 (H) 01/30/2017   Lab Results  Component Value Date   LABRPR NON REAC 01/30/2017   HIV 1 RNA Quant (copies/mL)  Date Value  10/02/2019 143,000 (H)  07/14/2019 100,000 (H)  05/06/2019 581 (H)   CD4 T Cell Abs (/uL)  Date Value  10/02/2019 <35 (L)  05/06/2019 57 (L)  04/17/2019 37 (L)     Problem List Items Addressed This Visit      High   HIV disease (HCC)    Krystal Bowen continues to struggle with adherence.  Her infection is poorly controlled and her CD4 count is dangerously low.  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  She admits that she has trouble taking her pills and that a lot of the difficulty is emotional rather than the size or taste of Biktarvy.  She has reviewed clinic visits and HIV medication has a direct reminder that she is living with HIV rather than a tool that she can use to take control of her infection.  I suggested that she set a goal of not missing any doses of Biktarvy over the next week and come back in 1 week to meet with our pharmacy staff and with our counselor, Okey Regal.  I will see her back in 1 month for lab work.  Addendum: She was then noted to have worsening gangrene when observed crushing Biktarvy and taking it with chocolate pudding here in clinic today.  We have decided to switch Biktarvy to Triumeq to see if that can reduce the better taking help her more consistent taking her medication.      Relevant  Medications   abacavir-dolutegravir-lamiVUDine (TRIUMEQ) 600-50-300 MG tablet        Marylu Lund, MD [facility] for Infectious Disease [facility] [phone] pager   [phone] cell 11/13/2019, 11:22 AM

## 2019-11-20 ENCOUNTER — Ambulatory Visit: Payer: PRIVATE HEALTH INSURANCE | Admitting: Pharmacist

## 2019-11-24 ENCOUNTER — Telehealth: Payer: Self-pay | Admitting: *Deleted

## 2019-11-24 NOTE — Telephone Encounter (Signed)
Notified pharmacy that valganciclovir has been approved by [employer]  Andree Coss, RN

## 2019-12-11 ENCOUNTER — Ambulatory Visit: Payer: PRIVATE HEALTH INSURANCE

## 2019-12-11 ENCOUNTER — Ambulatory Visit (INDEPENDENT_AMBULATORY_CARE_PROVIDER_SITE_OTHER): Payer: PRIVATE HEALTH INSURANCE | Admitting: Internal Medicine

## 2019-12-11 DIAGNOSIS — B20 Human immunodeficiency virus [HIV] disease: Secondary | ICD-10-CM

## 2019-12-11 NOTE — Progress Notes (Signed)
Virtual Visit via Telephone Note  I connected with Krystal Bowen on 12/11/19 at  9:30 AM EST by telephone and verified that I am speaking with the correct person using two identifiers.  Location: Patient: Home Provider: [facility]   I discussed the limitations, risks, security and privacy concerns of performing an evaluation and management service by telephone and the availability of in person appointments. I also discussed with the patient that there may be a patient responsible charge related to this service. The patient expressed understanding and agreed to proceed.   History of Present Illness: I called and spoke with Krystal Bowen today.  We recently switched her Biktarvy to Triumeq hoping she would tolerate it better and not miss doses.  She is crossing Triumeq and mixing it with yogurt.  She says that she is doing much better with the change.  It does not have a bitter taste.  She thinks she is only missed 1 or 2 doses in the past several weeks.  That occurred on a weekend when she slept late.  She is also taking her Valcyte and Septra without difficulty.   Observations/Objective: HIV 1 RNA Quant (copies/mL)  Date Value  10/02/2019 143,000 (H)  07/14/2019 100,000 (H)  05/06/2019 581 (H)   CD4 T Cell Abs (/uL)  Date Value  10/02/2019 <35 (L)  05/06/2019 57 (L)  04/17/2019 37 (L)    Assessment and Plan: It sounds like Krystal Bowen is doing better following the switch to Triumeq.  I encouraged her to do her best to not miss any doses.  Follow Up Instructions: Continue Triumeq Follow-up for lab work and counseling on 12/25/2019   I discussed the assessment and treatment plan with the patient. The patient was provided an opportunity to ask questions and all were answered. The patient agreed with the plan and demonstrated an understanding of the instructions.   The patient was advised to call back or seek an in-person evaluation if the symptoms worsen or if the condition fails to improve as  anticipated.  I provided 14 minutes of non-face-to-face time during this encounter.   [name], MD

## 2019-12-25 ENCOUNTER — Ambulatory Visit: Payer: PRIVATE HEALTH INSURANCE | Admitting: Internal Medicine

## 2019-12-25 ENCOUNTER — Ambulatory Visit: Payer: PRIVATE HEALTH INSURANCE

## 2020-02-26 ENCOUNTER — Ambulatory Visit: Payer: PRIVATE HEALTH INSURANCE | Attending: Internal Medicine

## 2020-02-26 DIAGNOSIS — Z23 Encounter for immunization: Secondary | ICD-10-CM

## 2020-02-26 NOTE — Progress Notes (Signed)
   Covid-19 Vaccination Clinic  Name:  DONNISHA BESECKER    MRN: [id number] DOB: 07-31-1971  02/26/2020  Ms. Student was observed post Covid-19 immunization for 15 minutes without incident. She was provided with Vaccine Information Sheet and instruction to access the V-Safe system.   Ms. Padmanabhan was instructed to call 911 with any severe reactions post vaccine: Marland Kitchen Difficulty breathing  . Swelling of face and throat  . A fast heartbeat  . A bad rash all over body  . Dizziness and weakness   Immunizations Administered    Name Date Dose VIS Date Route   Pfizer COVID-19 Vaccine 02/26/2020  9:13 AM 0.3 mL 12/17/2018 Intramuscular   Manufacturer: [employer], [employer]   Lot: [id number]   NDC: [address]-7

## 2020-03-23 ENCOUNTER — Ambulatory Visit: Payer: PRIVATE HEALTH INSURANCE | Attending: Internal Medicine

## 2020-03-23 DIAGNOSIS — Z23 Encounter for immunization: Secondary | ICD-10-CM

## 2020-03-23 NOTE — Progress Notes (Signed)
   Covid-19 Vaccination Clinic  Name:  Krystal Bowen    MRN: [id number] DOB: [birth year]  03/23/2020  [name] was observed post Covid-19 immunization for 15 minutes without incident. She was provided with Vaccine Information Sheet and instruction to access the V-Safe system.   [name] was instructed to call 911 with any severe reactions post vaccine: Marland Kitchen Difficulty breathing  . Swelling of face and throat  . A fast heartbeat  . A bad rash all over body  . Dizziness and weakness   Immunizations Administered    Name Date Dose VIS Date Route   Pfizer COVID-19 Vaccine 03/23/2020  9:32 AM 0.3 mL 12/17/2018 Intramuscular   Manufacturer: [employer], [employer]   Lot: [id number]   NDC: [address]-4

## 2020-04-13 ENCOUNTER — Emergency Department (HOSPITAL_COMMUNITY): Payer: PRIVATE HEALTH INSURANCE

## 2020-04-13 ENCOUNTER — Other Ambulatory Visit: Payer: Self-pay

## 2020-04-13 ENCOUNTER — Emergency Department (HOSPITAL_COMMUNITY)
Admission: EM | Admit: 2020-04-13 | Discharge: 2020-04-13 | Disposition: A | Discharge status: Home / Self Care | Payer: PRIVATE HEALTH INSURANCE | Attending: Emergency Medicine | Admitting: Emergency Medicine

## 2020-04-13 ENCOUNTER — Encounter (HOSPITAL_COMMUNITY): Payer: Self-pay

## 2020-04-13 DIAGNOSIS — I10 Essential (primary) hypertension: Secondary | ICD-10-CM | POA: Insufficient documentation

## 2020-04-13 DIAGNOSIS — Z79899 Other long term (current) drug therapy: Secondary | ICD-10-CM | POA: Insufficient documentation

## 2020-04-13 DIAGNOSIS — B20 Human immunodeficiency virus [HIV] disease: Secondary | ICD-10-CM | POA: Diagnosis not present

## 2020-04-13 DIAGNOSIS — M545 Low back pain: Secondary | ICD-10-CM | POA: Diagnosis not present

## 2020-04-13 DIAGNOSIS — M533 Sacrococcygeal disorders, not elsewhere classified: Secondary | ICD-10-CM

## 2020-04-13 NOTE — ED Provider Notes (Signed)
[facility] EMERGENCY DEPARTMENT Provider Note   CSN: [id number] Arrival date & time: 04/13/20  5929     History No chief complaint on file.   Krystal Bowen is a 49 y.o. female with PMHx HTN, HLD, and HIV (currently on Triumeq switched from [place]) who presents to the ED today with complaint of gradual onset, constant, achy, pain to her tailbone s/p mechanical fall that occurred yesterday.  Patient reports she slipped on a wet floor in the bathroom yesterday causing her to fall and land directly onto her backside.  No head injury or loss of consciousness.  States that she is able to get back up immediately afterwards.  She began having gradual onset pain to her backside this typically on her tailbone that is worsened with sitting down.  She states she has some pain with walking as well.  She has not taken anything for the pain and she wanted to get checked out prior to taking anything. Pt has no other complaints at this time.   The history is provided by the patient and medical records.       Past Medical History:  Diagnosis Date  . Anemia   . HIV infection (HCC)   . Hyperlipidemia   . Hypertension   . Neuropathy     Patient Active Problem List   Diagnosis Date Noted  . 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 Vulvar lesion 01/22/2019  . Noncompliance w/medication treatment due to intermit use of medication 10/18/2018  . Depression 01/01/2018  . Peripheral neuropathy 02/13/2017  . HIV disease (HCC) 02/12/2017  . HTN (hypertension) 02/12/2017  . Normocytic anemia 02/12/2017  . Dyslipidemia 02/12/2017  . History of shingles 02/12/2017    Past Surgical History:  Procedure Laterality Date  . HERNIA REPAIR       OB History   No obstetric history on file.     Family History  Problem Relation Age of Onset  . Heart attack Mother   . Stroke Mother   . Hyperlipidemia Mother   . Cancer Father        Leukemia   . Hyperlipidemia Maternal Grandmother   .  Alzheimer's disease Maternal Grandmother   . Heart attack Maternal Grandfather     Social History   Tobacco Use  . Smoking status: Never Smoker  . Smokeless tobacco: Never Used  Vaping Use  . Vaping Use: Never used  Substance Use Topics  . Alcohol use: Not Currently    Comment: social  . Drug use: No    Home Medications Prior to Admission medications   Medication Sig Start Date End Date Taking? Authorizing Provider  abacavir-dolutegravir-lamiVUDine (TRIUMEQ) 600-50-300 MG tablet Take 1 tablet by mouth daily. 11/13/19   [name], MD  erythromycin ophthalmic ointment Place into the right eye 2 times daily. 04/09/19   [provider]  gabapentin (NEURONTIN) 600 MG tablet Take 300 mg by mouth as needed (pain).     [provider]  hydrochlorothiazide (HYDRODIURIL) 25 MG tablet TAKE 1 TABLET(25 MG) BY MOUTH DAILY 01/07/19   [name], MD  prednisoLONE acetate (PRED FORTE) 1 % ophthalmic suspension Place 1 drop into the right eye every 2 hours. 04/09/19   [provider]  sulfamethoxazole-trimethoprim (BACTRIM,SEPTRA) 200-40 MG/5ML suspension Take 20 mLs by mouth daily. Patient not taking: Reported on 11/13/2019 01/21/19   Blanchard Kelch, NP  valGANciclovir (VALCYTE) 450 MG tablet Take 1 tablet (450 mg total) by mouth daily. 05/15/19   [name],  MD    Allergies    Patient has no known allergies.  Review of Systems   Review of Systems  Constitutional: Negative for chills and fever.  Musculoskeletal: Positive for arthralgias.  Neurological: Negative for syncope and headaches.    Physical Exam Updated Vital Signs BP 112/87 (BP Location: Left Arm)   Pulse (!) 122   Temp 98.5 F (36.9 C) (Oral)   Resp 12   SpO2 97%   Physical Exam Vitals and nursing note reviewed.  Constitutional:      Appearance: She is not ill-appearing or diaphoretic.  HENT:     Head: Normocephalic and atraumatic.  Eyes:     Conjunctiva/sclera: Conjunctivae  normal.  Cardiovascular:     Rate and Rhythm: Normal rate and regular rhythm.     Pulses: Normal pulses.  Pulmonary:     Effort: Pulmonary effort is normal.     Breath sounds: Normal breath sounds. No wheezing, rhonchi or rales.  Abdominal:     Palpations: Abdomen is soft.     Tenderness: There is no abdominal tenderness.  Musculoskeletal:     Cervical back: Neck supple.     Comments: No C, T, or L midline spinal TTP. + pain to coccyx bone with palpation. No ecchymosis appreciated. No tenderness to hips. Strength and sensation intact to BLEs. 2+ DP pulses.   Skin:    General: Skin is warm and dry.     Coloration: Skin is not jaundiced.  Neurological:     Mental Status: She is alert.     ED Results / Procedures / Treatments   Labs (all labs ordered are listed, but only abnormal results are displayed) Labs Reviewed - No data to display  EKG None  Radiology DG Sacrum/Coccyx  Result Date: 04/13/2020 CLINICAL DATA:  Fall.  Pain. EXAM: SACRUM AND COCCYX - 2+ VIEW COMPARISON:  Lumbar spine 05/17/2018. FINDINGS: No acute bony or joint abnormality identified. No evidence of fracture or dislocation. IMPRESSION: No acute bony or joint abnormality identified. Electronically Signed   By: Marcello Moores  Register   On: 04/13/2020 11:04    Procedures Procedures (including critical care time)  Medications Ordered in ED Medications - No data to display  ED Course  I have reviewed the triage vital signs and the nursing notes.  Pertinent labs & imaging results that were available during my care of the patient were reviewed by me and considered in my medical decision making (see chart for details).    MDM Rules/Calculators/A&P                          49 year old female who presents to the ED today complaining of pain to her tailbone status post mechanical fall that occurred yesterday.  No other complaints at this time or other injuries.  She denies any prodrome prior to her fall.  On arrival  to the ED patient is afebrile nontachypneic.  She is noted to be tachycardic in the 120s however I suspect this is likely secondary to pain as she has not taken anything.  Will recheck.  On exam patient has tenderness to her coccyx bone.  No other midline C, T, L spinal tenderness.  An x-ray of her sacrum and coccyx were obtained while patient was in the waiting room, no acute bony or joint abnormality identified.  I suspect some form of hematoma secondary to fall.  Will discharge home with PCP follow-up.  Patient instructed to take Tylenol as needed  for pain.  I have instructed patient to try to relieve pressure from her tailbone by sitting on a donut shaped pillow.  We will keep her out of work for the next 2 days so she can rest.  She is in agreement with plan and stable for discharge home.  Recheck heart rate 100.   This note was prepared using Dragon voice recognition software and may include unintentional dictation errors due to the inherent limitations of voice recognition software.  Final Clinical Impression(s) / ED Diagnoses Final diagnoses:  Coccyx pain    Rx / DC Orders ED Discharge Orders    None       Discharge Instructions     Your xray did not show any breaks. You likely are experiencing pain from a deep bruise on your tailbone. I would recommend taking Tylenol as needed for the pain. I would also recommend buying a doughnut shaped pillow that will help relieve some of the pressure on your tailbone itself. It can take a couple of days to a week to heal.   Follow up with your PCP for further evaluation regarding your ED visit today  Return to the ED for any worsening symptoms       Tanda Rockers, PA-C 04/13/20 1342    Gerhard Munch, MD 04/15/20 [phone]

## 2020-04-13 NOTE — ED Triage Notes (Signed)
Patient reports that she had mechanical fall yesterday complaining of coccyx pain. Pain with sitting and walking, NAD

## 2020-04-13 NOTE — Discharge Instructions (Signed)
Your xray did not show any breaks. You likely are experiencing pain from a deep bruise on your tailbone. I would recommend taking Tylenol as needed for the pain. I would also recommend buying a doughnut shaped pillow that will help relieve some of the pressure on your tailbone itself. It can take a couple of days to a week to heal.   Follow up with your PCP for further evaluation regarding your ED visit today  Return to the ED for any worsening symptoms

## 2020-04-13 NOTE — ED Notes (Signed)
Pt verbalized understanding.

## 2020-04-22 ENCOUNTER — Other Ambulatory Visit: Payer: Self-pay | Admitting: Internal Medicine

## 2020-04-22 ENCOUNTER — Telehealth: Payer: Self-pay

## 2020-04-22 DIAGNOSIS — B20 Human immunodeficiency virus [HIV] disease: Secondary | ICD-10-CM

## 2020-04-22 MED ORDER — NYSTATIN 100000 UNIT/ML MT SUSP: 5.0000 mL | Freq: Four times a day (QID) | OROMUCOSAL | 0 refills | Status: DC

## 2020-04-22 NOTE — Telephone Encounter (Signed)
I sent an order to her pharmacy.

## 2020-04-22 NOTE — Telephone Encounter (Signed)
Patient calling to request nystatin mouthwash for thrush that has been present for two weeks.   She has scheduled visit for later in July,. Due to a tailbone injury from a fall she was not able to schedule sooner.   Please advise.   Laurell Josephs, RN

## 2020-04-22 NOTE — Telephone Encounter (Signed)
Patient informed medication was sent to pharmacy.

## 2020-05-17 ENCOUNTER — Ambulatory Visit: Payer: PRIVATE HEALTH INSURANCE | Admitting: Internal Medicine

## 2020-06-03 ENCOUNTER — Ambulatory Visit: Payer: PRIVATE HEALTH INSURANCE

## 2020-06-03 ENCOUNTER — Other Ambulatory Visit: Payer: Self-pay

## 2020-06-08 ENCOUNTER — Other Ambulatory Visit: Payer: Self-pay

## 2020-06-08 ENCOUNTER — Encounter: Payer: Self-pay | Admitting: Internal Medicine

## 2020-06-08 ENCOUNTER — Ambulatory Visit (INDEPENDENT_AMBULATORY_CARE_PROVIDER_SITE_OTHER): Payer: PRIVATE HEALTH INSURANCE | Admitting: Internal Medicine

## 2020-06-08 DIAGNOSIS — B20 Human immunodeficiency virus [HIV] disease: Secondary | ICD-10-CM | POA: Diagnosis not present

## 2020-06-08 DIAGNOSIS — B259 Cytomegaloviral disease, unspecified: Secondary | ICD-10-CM

## 2020-06-08 DIAGNOSIS — F3340 Major depressive disorder, recurrent, in remission, unspecified: Secondary | ICD-10-CM

## 2020-06-08 DIAGNOSIS — H3090 Unspecified chorioretinal inflammation, unspecified eye: Secondary | ICD-10-CM

## 2020-06-08 MED ORDER — SULFAMETHOXAZOLE-TRIMETHOPRIM 200-40 MG/5ML PO SUSP: 20.0000 mL | Freq: Every day | ORAL | 11 refills | Status: DC

## 2020-06-08 NOTE — Assessment & Plan Note (Signed)
It sounds like her adherence with valganciclovir is good and that her retinitis is quiescent.

## 2020-06-08 NOTE — Assessment & Plan Note (Signed)
It sounds as though her adherence with Triumeq has improved.  I will repeat lab work today and see her back in 4 weeks.

## 2020-06-08 NOTE — Assessment & Plan Note (Signed)
I will arrange a visit with our behavioral health counselor.

## 2020-06-08 NOTE — Progress Notes (Signed)
Patient Active Problem List   Diagnosis Date Noted  . 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 Depression 01/01/2018    Priority: High  . HIV disease (HCC) 02/12/2017    Priority: High  . Vulvar lesion 01/22/2019  . Noncompliance w/medication treatment due to intermit use of medication 10/18/2018  . Peripheral neuropathy 02/13/2017  . HTN (hypertension) 02/12/2017  . Normocytic anemia 02/12/2017  . Dyslipidemia 02/12/2017  . History of shingles 02/12/2017    Patient's Medications  New Prescriptions   No medications on file  Previous Medications   ABACAVIR-DOLUTEGRAVIR-LAMIVUDINE (TRIUMEQ) 600-50-300 MG TABLET    Take 1 tablet by mouth daily.   ERYTHROMYCIN OPHTHALMIC OINTMENT    Place into the right eye 2 times daily.   GABAPENTIN (NEURONTIN) 600 MG TABLET    Take 300 mg by mouth as needed (pain).    HYDROCHLOROTHIAZIDE (HYDRODIURIL) 25 MG TABLET    TAKE 1 TABLET(25 MG) BY MOUTH DAILY   NYSTATIN (MYCOSTATIN) 100000 UNIT/ML SUSPENSION    Take 5 mLs (500,000 Units total) by mouth 4 (four) times daily.   PREDNISOLONE ACETATE (PRED FORTE) 1 % OPHTHALMIC SUSPENSION    Place 1 drop into the right eye every 2 hours.   VALGANCICLOVIR (VALCYTE) 450 MG TABLET    Take 1 tablet (450 mg total) by mouth daily.  Modified Medications   Modified Medication Previous Medication   SULFAMETHOXAZOLE-TRIMETHOPRIM (BACTRIM) 200-40 MG/5ML SUSPENSION sulfamethoxazole-trimethoprim (BACTRIM,SEPTRA) 200-40 MG/5ML suspension      Take 20 mLs by mouth daily.    Take 20 mLs by mouth daily.  Discontinued Medications   No medications on file    Subjective: Krystal Bowen is in for her routine HIV follow-up visit.  She denies any trouble obtaining or tolerating her Triumeq or Valcyte.  She crushes Triumeq and mixes it with applesauce or yogurt.  She was not aware that she was supposed to be taking trimethoprim sulfamethoxazole.  She took the [employer] vaccine.  She did tell all of her children  about her HIV infection several weeks ago.  She says that that was a real relief not to have to keep it a secret any longer.  She feels that she has been their full support.  Review of Systems: Review of Systems  Constitutional: Positive for malaise/fatigue and weight loss. Negative for chills, diaphoresis and fever.  HENT: Negative for congestion and sore throat.   Eyes: Positive for blurred vision.       She saw her ophthalmologist recently who told her that her right eye changes were stable.  Respiratory: Negative for cough and shortness of breath.   Cardiovascular: Negative for chest pain.  Gastrointestinal: Negative for abdominal pain, diarrhea, nausea and vomiting.  Genitourinary: Negative for dysuria.  Musculoskeletal: Negative for back pain and joint pain.  Psychiatric/Behavioral: Positive for depression. Negative for suicidal ideas. The patient is not nervous/anxious.     Past Medical History:  Diagnosis Date  . Anemia   . HIV infection (HCC)   . Hyperlipidemia   . Hypertension   . Neuropathy     Social History   Tobacco Use  . Smoking status: Never Smoker  . Smokeless tobacco: Never Used  Vaping Use  . Vaping Use: Never used  Substance Use Topics  . Alcohol use: Not Currently    Comment: social  . Drug use: No    Family History  Problem Relation Age of Onset  . Heart attack Mother   .  Stroke Mother   . Hyperlipidemia Mother   . Cancer Father        Leukemia   . Hyperlipidemia Maternal Grandmother   . Alzheimer's disease Maternal Grandmother   . Heart attack Maternal Grandfather     No Known Allergies  Health Maintenance  Topic Date Due  . PAP SMEAR-Modifier  05/03/2020  . INFLUENZA VACCINE  05/23/2020  . TETANUS/TDAP  09/09/2020  . COVID-19 Vaccine  Completed  . Hepatitis C Screening  Completed  . HIV Screening  Completed    Objective:  Vitals:   06/08/20 1430  BP: 114/82  Pulse: (!) 125  Temp: 98.8 F (37.1 C)  TempSrc: Oral  Weight:  90 lb (40.8 kg)   Body mass index is 17.01 kg/m.  Physical Exam Constitutional:      Comments: Her weight is down 19 pounds over the last year.  She became tearful during the exam and asked "why did this happen to me?"  Cardiovascular:     Rate and Rhythm: Regular rhythm. Tachycardia present.     Heart sounds: No murmur heard.   Pulmonary:     Effort: Pulmonary effort is normal.     Breath sounds: Normal breath sounds.  Abdominal:     Palpations: Abdomen is soft.     Tenderness: There is no abdominal tenderness.  Musculoskeletal:        General: No swelling or tenderness.  Skin:    Findings: No rash.  Neurological:     General: No focal deficit present.     Lab Results Lab Results  Component Value Date   WBC 3.0 (L) 07/14/2019   HGB 11.0 (L) 07/14/2019   HCT 33.4 (L) 07/14/2019   MCV 97 07/14/2019   PLT 257 07/14/2019    Lab Results  Component Value Date   CREATININE 1.03 05/06/2019   BUN 24 05/06/2019   NA 139 05/06/2019   K 3.6 05/06/2019   CL 105 05/06/2019   CO2 27 05/06/2019    Lab Results  Component Value Date   ALT 6 05/06/2019   AST 14 05/06/2019   ALKPHOS 77 05/07/2017   BILITOT 0.2 05/06/2019    Lab Results  Component Value Date   CHOL 162 01/30/2017   HDL 25 (L) 01/30/2017   LDLCALC 58 01/30/2017   TRIG 394 (H) 01/30/2017   CHOLHDL 6.5 (H) 01/30/2017   Lab Results  Component Value Date   LABRPR NON REAC 01/30/2017   HIV 1 RNA Quant (copies/mL)  Date Value  10/02/2019 143,000 (H)  07/14/2019 100,000 (H)  05/06/2019 581 (H)   CD4 T Cell Abs (/uL)  Date Value  10/02/2019 <35 (L)  05/06/2019 57 (L)  04/17/2019 37 (L)     Problem List Items Addressed This Visit      High   HIV disease (HCC)    It sounds as though her adherence with Triumeq has improved.  I will repeat lab work today and see her back in 4 weeks.      Relevant Medications   sulfamethoxazole-trimethoprim (BACTRIM) 200-40 MG/5ML suspension   Other Relevant Orders    T-helper cell (CD4)- ([facility] clinic only)   HIV-1 RNA quant-no reflex-bld   CBC   Comprehensive metabolic panel   RPR   Lipid panel   Depression    I will arrange a visit with our behavioral health counselor.      CMV retinitis (HCC)    It sounds like her adherence with valganciclovir is good and that  her retinitis is quiescent.      Relevant Medications   sulfamethoxazole-trimethoprim (BACTRIM) 200-40 MG/5ML suspension        [name], MD [facility] for Infectious Disease [facility] [phone] pager   [phone] cell 06/08/2020, 2:53 PM

## 2020-06-09 LAB — T-HELPER CELL (CD4) - (RCID CLINIC ONLY)
CD4 % Helper T Cell: 5 % — ABNORMAL LOW (ref 33–65)
CD4 T Cell Abs: <35 /uL — ABNORMAL LOW (ref 400–1790)

## 2020-06-11 LAB — CBC
HCT: 27.8 % — ABNORMAL LOW (ref 35.0–45.0)
Hemoglobin: 8.9 g/dL — ABNORMAL LOW (ref 11.7–15.5)
MCH: 29.8 pg (ref 27.0–33.0)
MCHC: 32 g/dL (ref 32.0–36.0)
MCV: 93 fL (ref 80.0–100.0)
MPV: 10.8 fL (ref 7.5–12.5)
Platelets: 349 10*3/uL (ref 140–400)
RBC: 2.99 10*6/uL — ABNORMAL LOW (ref 3.80–5.10)
RDW: 15.9 % — ABNORMAL HIGH (ref 11.0–15.0)
WBC: 4.8 10*3/uL (ref 3.8–10.8)

## 2020-06-11 LAB — COMPREHENSIVE METABOLIC PANEL
AG Ratio: 0.6 (calc) — ABNORMAL LOW (ref 1.0–2.5)
ALT: 9 U/L (ref 6–29)
AST: 20 U/L (ref 10–35)
Albumin: 2.9 g/dL — ABNORMAL LOW (ref 3.6–5.1)
Alkaline phosphatase (APISO): 63 U/L (ref 31–125)
BUN/Creatinine Ratio: 23 (calc) — ABNORMAL HIGH (ref 6–22)
BUN: 31 mg/dL — ABNORMAL HIGH (ref 7–25)
CO2: 23 mmol/L (ref 20–32)
Calcium: 8.5 mg/dL — ABNORMAL LOW (ref 8.6–10.2)
Chloride: 105 mmol/L (ref 98–110)
Creat: 1.32 mg/dL — ABNORMAL HIGH (ref 0.50–1.10)
Globulin: 4.5 g/dL (calc) — ABNORMAL HIGH (ref 1.9–3.7)
Glucose, Bld: 107 mg/dL — ABNORMAL HIGH (ref 65–99)
Potassium: 4.4 mmol/L (ref 3.5–5.3)
Sodium: 138 mmol/L (ref 135–146)
Total Bilirubin: 0.2 mg/dL (ref 0.2–1.2)
Total Protein: 7.4 g/dL (ref 6.1–8.1)

## 2020-06-11 LAB — LIPID PANEL
Cholesterol: 147 mg/dL (ref <200)
HDL: 27 mg/dL — ABNORMAL LOW (ref >=50)
LDL Cholesterol (Calc): 82 mg/dL (calc)
Non-HDL Cholesterol (Calc): 120 mg/dL (calc) (ref <130)
Total CHOL/HDL Ratio: 5.4 (calc) — ABNORMAL HIGH (ref <5.0)
Triglycerides: 291 mg/dL — ABNORMAL HIGH (ref <150)

## 2020-06-11 LAB — HIV-1 RNA QUANT-NO REFLEX-BLD
HIV 1 RNA Quant: 279000 Copies/mL — ABNORMAL HIGH
HIV-1 RNA Quant, Log: 5.45 Log cps/mL — ABNORMAL HIGH

## 2020-06-11 LAB — RPR: RPR Ser Ql: NONREACTIVE

## 2020-07-01 ENCOUNTER — Ambulatory Visit: Payer: PRIVATE HEALTH INSURANCE | Admitting: Internal Medicine

## 2020-07-30 ENCOUNTER — Encounter: Payer: Self-pay | Admitting: Internal Medicine

## 2020-08-10 ENCOUNTER — Encounter: Payer: Self-pay | Admitting: Internal Medicine

## 2020-08-10 ENCOUNTER — Other Ambulatory Visit: Payer: Self-pay

## 2020-08-10 ENCOUNTER — Ambulatory Visit (INDEPENDENT_AMBULATORY_CARE_PROVIDER_SITE_OTHER): Payer: PRIVATE HEALTH INSURANCE | Admitting: Internal Medicine

## 2020-08-10 VITALS — BP 141/93 | HR 134 | Temp 99.5°F | Ht 61.0 in | Wt 88.0 lb

## 2020-08-10 DIAGNOSIS — H3090 Unspecified chorioretinal inflammation, unspecified eye: Secondary | ICD-10-CM

## 2020-08-10 DIAGNOSIS — B259 Cytomegaloviral disease, unspecified: Secondary | ICD-10-CM

## 2020-08-10 DIAGNOSIS — B20 Human immunodeficiency virus [HIV] disease: Secondary | ICD-10-CM

## 2020-08-10 DIAGNOSIS — F332 Major depressive disorder, recurrent severe without psychotic features: Secondary | ICD-10-CM | POA: Diagnosis not present

## 2020-08-10 DIAGNOSIS — E43 Unspecified severe protein-calorie malnutrition: Secondary | ICD-10-CM | POA: Insufficient documentation

## 2020-08-10 DIAGNOSIS — Z23 Encounter for immunization: Secondary | ICD-10-CM

## 2020-08-10 NOTE — Assessment & Plan Note (Signed)
It seems as though her retinitis is stable on valganciclovir.

## 2020-08-10 NOTE — Progress Notes (Signed)
Patient Active Problem List   Diagnosis Date Noted  . 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 Depression 01/01/2018    Priority: High  . HIV disease (HCC) 02/12/2017    Priority: High  . Unspecified severe protein-calorie malnutrition (HCC) 08/10/2020  . Vulvar lesion 01/22/2019  . Noncompliance w/medication treatment due to intermit use of medication 10/18/2018  . Peripheral neuropathy 02/13/2017  . HTN (hypertension) 02/12/2017  . Normocytic anemia 02/12/2017  . Dyslipidemia 02/12/2017  . History of shingles 02/12/2017    Patient's Medications  New Prescriptions   No medications on file  Previous Medications   ABACAVIR-DOLUTEGRAVIR-LAMIVUDINE (TRIUMEQ) 600-50-300 MG TABLET    Take 1 tablet by mouth daily.   ERYTHROMYCIN OPHTHALMIC OINTMENT    Place into the right eye 2 times daily.   GABAPENTIN (NEURONTIN) 600 MG TABLET    Take 300 mg by mouth as needed (pain).    HYDROCHLOROTHIAZIDE (HYDRODIURIL) 25 MG TABLET    TAKE 1 TABLET(25 MG) BY MOUTH DAILY   NYSTATIN (MYCOSTATIN) 100000 UNIT/ML SUSPENSION    Take 5 mLs (500,000 Units total) by mouth 4 (four) times daily.   PREDNISOLONE ACETATE (PRED FORTE) 1 % OPHTHALMIC SUSPENSION    Place 1 drop into the right eye every 2 hours.   SULFAMETHOXAZOLE-TRIMETHOPRIM (BACTRIM) 200-40 MG/5ML SUSPENSION    Take 20 mLs by mouth daily.   VALGANCICLOVIR (VALCYTE) 450 MG TABLET    Take 1 tablet (450 mg total) by mouth daily.  Modified Medications   No medications on file  Discontinued Medications   No medications on file    Subjective: Krystal Bowen is in for her routine HIV follow-up visit.  She denies any problems obtaining her Triumeq, trimethoprim sulfamethoxazole or valganciclovir.  She says that she has been working very hard on taking her medications more consistently.  She missed a dose of Triumeq 2 days ago when she fell asleep before taking it at 4:30 PM.  When she woke up later that day she did not take it. She  thinks she is only missed 3 or 4 doses in the past month.  She has not had any further changes in her vision.  She says that she has been more depressed and is asking for help.  She has not scheduled a follow-up visit with our behavioral health counselor yet.  She has received the first 2 doses of the Pfizer Covid vaccine.  Her second dose was about 4 months ago.  She has continued working throughout the pandemic.  Review of Systems: Review of Systems  Constitutional: Positive for malaise/fatigue and weight loss. Negative for chills, diaphoresis and fever.       She has lost 20 pounds in the past 10 months.  HENT: Positive for congestion.   Eyes: Positive for blurred vision.       She has chronic floaters in her right eye.  Respiratory: Negative for cough, sputum production and shortness of breath.   Cardiovascular: Negative for chest pain.  Gastrointestinal: Negative for abdominal pain, diarrhea, nausea and vomiting.  Genitourinary: Negative for dysuria.  Musculoskeletal: Negative for joint pain and myalgias.  Psychiatric/Behavioral: Positive for depression. The patient is nervous/anxious.     Past Medical History:  Diagnosis Date  . Anemia   . HIV infection (HCC)   . Hyperlipidemia   . Hypertension   . Neuropathy     Social History   Tobacco Use  . Smoking status: Never Smoker  .  Smokeless tobacco: Never Used  Vaping Use  . Vaping Use: Never used  Substance Use Topics  . Alcohol use: Not Currently    Comment: social  . Drug use: No    Family History  Problem Relation Age of Onset  . Heart attack Mother   . Stroke Mother   . Hyperlipidemia Mother   . Cancer Father        Leukemia   . Hyperlipidemia Maternal Grandmother   . Alzheimer's disease Maternal Grandmother   . Heart attack Maternal Grandfather     No Known Allergies  Health Maintenance  Topic Date Due  . PAP SMEAR-Modifier  05/03/2020  . INFLUENZA VACCINE  05/23/2020  . TETANUS/TDAP  09/09/2020  .  COVID-19 Vaccine  Completed  . Hepatitis C Screening  Completed  . HIV Screening  Completed    Objective:  Vitals:   08/10/20 1526  BP: (!) 141/93  Pulse: (!) 134  Temp: 99.5 F (37.5 C)  TempSrc: Oral  SpO2: 98%  Weight: 88 lb (39.9 kg)  Height: 5\' 1"  (1.549 m)   Body mass index is 16.63 kg/m.  Physical Exam Constitutional:      Comments: She is cachectic.  She is quite anxious today.  Cardiovascular:     Rate and Rhythm: Regular rhythm. Tachycardia present.     Heart sounds: No murmur heard.   Pulmonary:     Effort: Pulmonary effort is normal.     Breath sounds: Normal breath sounds.  Abdominal:     Palpations: Abdomen is soft.     Tenderness: There is no abdominal tenderness.  Musculoskeletal:        General: No swelling or tenderness.  Skin:    Findings: No rash.  Neurological:     General: No focal deficit present.     Lab Results Lab Results  Component Value Date   WBC 4.8 06/08/2020   HGB 8.9 (L) 06/08/2020   HCT 27.8 (L) 06/08/2020   MCV 93.0 06/08/2020   PLT 349 06/08/2020    Lab Results  Component Value Date   CREATININE 1.32 (H) 06/08/2020   BUN 31 (H) 06/08/2020   NA 138 06/08/2020   K 4.4 06/08/2020   CL 105 06/08/2020   CO2 23 06/08/2020    Lab Results  Component Value Date   ALT 9 06/08/2020   AST 20 06/08/2020   ALKPHOS 77 05/07/2017   BILITOT 0.2 06/08/2020    Lab Results  Component Value Date   CHOL 147 06/08/2020   HDL 27 (L) 06/08/2020   LDLCALC 82 06/08/2020   TRIG 291 (H) 06/08/2020   CHOLHDL 5.4 (H) 06/08/2020   Lab Results  Component Value Date   LABRPR NON-REACTIVE 06/08/2020   HIV 1 RNA Quant  Date Value  06/08/2020 279,000 Copies/mL (H)  10/02/2019 143,000 copies/mL (H)  07/14/2019 100,000 copies/mL (H)   CD4 T Cell Abs (/uL)  Date Value  06/08/2020 <35 (L)  10/02/2019 <35 (L)  05/06/2019 57 (L)     Problem List Items Addressed This Visit      High   HIV disease (HCC)    She is trying hard to  be more adherent with her medication.  We will repeat blood work today.  She does seem to have more insight into the role depression is playing in her poor adherence and worsening health.      Relevant Orders   T-helper cell (CD4)- ([facility] clinic only)   HIV-1 RNA quant-no reflex-bld  CBC   Comprehensive metabolic panel   Depression    I will arrange follow-up with our behavioral health counselor ASAP.      CMV retinitis (HCC)    It seems as though her retinitis is stable on valganciclovir.        Unprioritized   Unspecified severe protein-calorie malnutrition (HCC)    She has had continued weight loss leading to malnutrition related to her depression and poorly controlled HIV infection.           [name], MD [facility] for Infectious Disease [facility] [phone] pager   [phone] cell 08/10/2020, 3:44 PM

## 2020-08-10 NOTE — Assessment & Plan Note (Signed)
She has had continued weight loss leading to malnutrition related to her depression and poorly controlled HIV infection.

## 2020-08-10 NOTE — Assessment & Plan Note (Signed)
She is trying hard to be more adherent with her medication.  We will repeat blood work today.  She does seem to have more insight into the role depression is playing in her poor adherence and worsening health.

## 2020-08-10 NOTE — Assessment & Plan Note (Signed)
I will arrange follow-up with our behavioral health counselor ASAP.

## 2020-08-11 LAB — T-HELPER CELL (CD4) - (RCID CLINIC ONLY)
CD4 % Helper T Cell: 3 % — ABNORMAL LOW (ref 33–65)
CD4 T Cell Abs: <35 /uL — ABNORMAL LOW (ref 400–1790)

## 2020-08-12 LAB — COMPREHENSIVE METABOLIC PANEL
AG Ratio: 0.6 (calc) — ABNORMAL LOW (ref 1.0–2.5)
ALT: 10 U/L (ref 6–29)
AST: 20 U/L (ref 10–35)
Albumin: 2.8 g/dL — ABNORMAL LOW (ref 3.6–5.1)
Alkaline phosphatase (APISO): 80 U/L (ref 31–125)
BUN/Creatinine Ratio: 22 (calc) (ref 6–22)
BUN: 25 mg/dL (ref 7–25)
CO2: 24 mmol/L (ref 20–32)
Calcium: 8.1 mg/dL — ABNORMAL LOW (ref 8.6–10.2)
Chloride: 103 mmol/L (ref 98–110)
Creat: 1.15 mg/dL — ABNORMAL HIGH (ref 0.50–1.10)
Globulin: 4.7 g/dL (calc) — ABNORMAL HIGH (ref 1.9–3.7)
Glucose, Bld: 104 mg/dL — ABNORMAL HIGH (ref 65–99)
Potassium: 3.9 mmol/L (ref 3.5–5.3)
Sodium: 135 mmol/L (ref 135–146)
Total Bilirubin: 0.3 mg/dL (ref 0.2–1.2)
Total Protein: 7.5 g/dL (ref 6.1–8.1)

## 2020-08-12 LAB — CBC
HCT: 29.3 % — ABNORMAL LOW (ref 35.0–45.0)
Hemoglobin: 9.4 g/dL — ABNORMAL LOW (ref 11.7–15.5)
MCH: 29.8 pg (ref 27.0–33.0)
MCHC: 32.1 g/dL (ref 32.0–36.0)
MCV: 93 fL (ref 80.0–100.0)
MPV: 11.6 fL (ref 7.5–12.5)
Platelets: 286 10*3/uL (ref 140–400)
RBC: 3.15 10*6/uL — ABNORMAL LOW (ref 3.80–5.10)
RDW: 14.2 % (ref 11.0–15.0)
WBC: 4.7 10*3/uL (ref 3.8–10.8)

## 2020-08-12 LAB — HIV-1 RNA QUANT-NO REFLEX-BLD
HIV 1 RNA Quant: 32500 Copies/mL — ABNORMAL HIGH
HIV-1 RNA Quant, Log: 4.51 Log cps/mL — ABNORMAL HIGH

## 2020-08-19 ENCOUNTER — Ambulatory Visit: Payer: PRIVATE HEALTH INSURANCE

## 2020-08-19 ENCOUNTER — Other Ambulatory Visit: Payer: Self-pay

## 2020-08-26 ENCOUNTER — Ambulatory Visit: Payer: PRIVATE HEALTH INSURANCE

## 2020-09-02 ENCOUNTER — Other Ambulatory Visit: Payer: Self-pay

## 2020-09-02 ENCOUNTER — Ambulatory Visit: Payer: PRIVATE HEALTH INSURANCE

## 2020-09-07 ENCOUNTER — Ambulatory Visit: Payer: PRIVATE HEALTH INSURANCE | Admitting: Internal Medicine

## 2020-10-12 ENCOUNTER — Ambulatory Visit: Payer: PRIVATE HEALTH INSURANCE | Admitting: Internal Medicine

## 2020-10-14 ENCOUNTER — Other Ambulatory Visit: Payer: Self-pay | Admitting: Internal Medicine

## 2020-10-14 DIAGNOSIS — B20 Human immunodeficiency virus [HIV] disease: Secondary | ICD-10-CM

## 2020-10-27 ENCOUNTER — Ambulatory Visit (INDEPENDENT_AMBULATORY_CARE_PROVIDER_SITE_OTHER): Payer: No Typology Code available for payment source | Admitting: Internal Medicine

## 2020-10-27 ENCOUNTER — Encounter: Payer: Self-pay | Admitting: Internal Medicine

## 2020-10-27 ENCOUNTER — Telehealth: Payer: Self-pay | Admitting: Pharmacist

## 2020-10-27 ENCOUNTER — Other Ambulatory Visit: Payer: Self-pay

## 2020-10-27 DIAGNOSIS — R634 Abnormal weight loss: Secondary | ICD-10-CM | POA: Diagnosis not present

## 2020-10-27 DIAGNOSIS — B20 Human immunodeficiency virus [HIV] disease: Secondary | ICD-10-CM

## 2020-10-27 DIAGNOSIS — F332 Major depressive disorder, recurrent severe without psychotic features: Secondary | ICD-10-CM | POA: Diagnosis not present

## 2020-10-27 NOTE — Assessment & Plan Note (Signed)
I reiterated to her and her mother that her unintentional weight loss is directly related to poorly treated HIV infection but will improve if she can take her medication correctly and consistently.

## 2020-10-27 NOTE — Telephone Encounter (Signed)
Cumulative HIV Genotype Data  RT Mutations   PI Mutations  [id number]  Integrase Mutations    Interpretation of Genotype Data per [employer] HIV Drug Resistance Database:  Nucleoside RTIs  Abacavir - susceptible Zidovudine - susceptible Emtricitabine - susceptible Lamivudine - susceptible Tenofovir - susceptible   Non-Nucleoside RTIs  Doravirine - susceptible Efavirenz - susceptible Etravirine - susceptible Nevirapine - susceptible Rilpivirine - susceptible   Protease Inhibitors  Atazanavir - potential low level resistance Darunavir - susceptible Lopinavir - potential low level resistance   Integrase Inhibitors  Bictegravir - susceptible Dolutegravir - susceptible Elvitegravir - susceptible Raltegravir - susceptible

## 2020-10-27 NOTE — Assessment & Plan Note (Addendum)
Krystal Bowen continues a rapid downward spiral.  This is largely due to ongoing denial about the role advanced, untreated HIV is playing in her illness.  I had a lengthy discussion with them about the need to do a 180 degree, immediate turnaround and how she understands and manages her HIV infection.  Without this she will continue to decline and probably die within the next 6 months.  I told him repeatedly that if she will work with [place] to consistently take an effective HIV regimen she will improve.  She might be a good candidate for injectable cabotegravir in the near future but we need to stabilize her condition with an oral regimen to make sure that that remains an opportunity for her.  Her depression continues to be a contributing role.  I told them repeatedly to keep things simple and try to understand that all of her current problems are due to HIV not some other elusive illness that we have not yet uncovered.  They agreed to return tomorrow.  I have discussed the situation with my pharmacy partner who will help me review past and potentially future antiretroviral regimens.  I will also have her meet with our behavioral health counselor.  AddendumRobinette Haines, RPH-CPP  [name], MD She was diagnosed in 2011 but did not start medications until 2012. She initially started on Atripla but could not tolerate, so she was switched to Viread + Emtriva + Isentress. She stopped taking it soon after starting.   She came to [place] in April 2018 and we started her on Tivicay and Descovy so she could crush them. She only took for a few weeks and was switched to [facility] in August 2018 and told to crush and put in applesauce. 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 In January of 2021, she was switched to Triumeq because Biktarvy had a "metallic taste". This is where we stand today.   She does have some PI resistance (I put a note in her chart) - potential low  level resistance to atazanavir and lopinavir. Darunavir has no resistance. Otherwise, she has no resistance which is a little shocking but maybe not since I bet she doesn't take her medications really at all.    I can't really think of better regimens than Tivicay and Descovy or Biktarvy. These are the smallest we have and the easiest to toleratel. I know the Cabenuva studies technically want you to be undetectable, but we aren't going to get there with her. I am worried about her taking the oral lead-in for Cabenuva which is cabotegravir + rilpivirine once daily with a meal. The rilpivirne is tiny but the cabotegravir is the size of Biktarvy, maybe a little bigger. We, of course, have no crushing data about cabotegravir, so I would be nervous to have her crush it. But, if she has to then I guess she has to.   This is a very tough situation. I dont really know what to put her on that will satisfy her (nothing will because of her mental state). I'm almost ready to say let's try Cabenuva as it may be her last chance, but also nervous about that too. I'm really at a loss for this one!

## 2020-10-27 NOTE — Assessment & Plan Note (Signed)
She will meet with our behavioral health counselor tomorrow.

## 2020-10-27 NOTE — Progress Notes (Addendum)
Patient Active Problem List   Diagnosis Date Noted  . Unintentional weight loss 10/27/2020    Priority: High  . 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 Depression 01/01/2018    Priority: High  . HIV disease (HCC) 02/12/2017    Priority: High  . Unspecified severe protein-calorie malnutrition (HCC) 08/10/2020  . Vulvar lesion 01/22/2019  . Noncompliance w/medication treatment due to intermit use of medication 10/18/2018  . Peripheral neuropathy 02/13/2017  . HTN (hypertension) 02/12/2017  . Normocytic anemia 02/12/2017  . Dyslipidemia 02/12/2017  . History of shingles 02/12/2017    Patient's Medications  New Prescriptions   No medications on file  Previous Medications   ERYTHROMYCIN OPHTHALMIC OINTMENT    Place into the right eye 2 times daily.   GABAPENTIN (NEURONTIN) 600 MG TABLET    Take 300 mg by mouth as needed (pain).    HYDROCHLOROTHIAZIDE (HYDRODIURIL) 25 MG TABLET    TAKE 1 TABLET(25 MG) BY MOUTH DAILY   NYSTATIN (MYCOSTATIN) 100000 UNIT/ML SUSPENSION    Take 5 mLs (500,000 Units total) by mouth 4 (four) times daily.   PREDNISOLONE ACETATE (PRED FORTE) 1 % OPHTHALMIC SUSPENSION    Place 1 drop into the right eye every 2 hours.   SULFAMETHOXAZOLE-TRIMETHOPRIM (BACTRIM) 200-40 MG/5ML SUSPENSION    Take 20 mLs by mouth daily.   TRIUMEQ 600-50-300 MG TABLET    TAKE 1 TABLET BY MOUTH DAILY   VALGANCICLOVIR (VALCYTE) 450 MG TABLET    Take 1 tablet (450 mg total) by mouth daily.  Modified Medications   No medications on file  Discontinued Medications   No medications on file    Subjective: Krystal Bowen is in for her routine HIV follow-up visit.  She tells me again that she is feeling "sick and tired of feeling sick and tired".  She continues to have problems with depression, fatigue, anorexia, intermittent diarrhea and weight loss.  Her vision is stable.  She is currently taking Triumeq.  She crushes it up and takes it with applesauce and some sprinkled  cinnamon.  She says that she does not have trouble swallowing this but says that she estimates that she only takes her Triumeq 3-4 times each week.  When I asked her what is causing her to feel so poorly she hesitates and then says "IBS?"  Review of Systems: Review of Systems  Constitutional: Positive for malaise/fatigue and weight loss. Negative for chills, diaphoresis and fever.  HENT: Negative for sore throat.   Respiratory: Negative for cough, sputum production and shortness of breath.   Cardiovascular: Negative for chest pain.  Gastrointestinal: Positive for diarrhea. Negative for abdominal pain, heartburn, nausea and vomiting.  Genitourinary: Negative for dysuria and frequency.  Musculoskeletal: Negative for joint pain and myalgias.  Skin: Negative for rash.  Neurological: Negative for dizziness and headaches.  Psychiatric/Behavioral: Positive for depression. Negative for substance abuse. The patient is not nervous/anxious.     Past Medical History:  Diagnosis Date  . Anemia   . HIV infection (HCC)   . Hyperlipidemia   . Hypertension   . Neuropathy     Social History   Tobacco Use  . Smoking status: Never Smoker  . Smokeless tobacco: Never Used  Vaping Use  . Vaping Use: Never used  Substance Use Topics  . Alcohol use: Not Currently    Comment: social  . Drug use: No    Family History  Problem Relation Age of Onset  .  Heart attack Mother   . Stroke Mother   . Hyperlipidemia Mother   . Cancer Father        Leukemia   . Hyperlipidemia Maternal Grandmother   . Alzheimer's disease Maternal Grandmother   . Heart attack Maternal Grandfather     No Known Allergies  Health Maintenance  Topic Date Due  . COLONOSCOPY (Pts 45-48yrs Insurance coverage will need to be confirmed)  Never done  . COVID-19 Vaccine (3 - Pfizer risk 4-dose series) 04/20/2020  . PAP SMEAR-Modifier  05/03/2020  . TETANUS/TDAP  09/09/2020  . INFLUENZA VACCINE  Completed  . Hepatitis C  Screening  Completed  . HIV Screening  Completed    Objective:  Vitals:   10/27/20 0948  BP: (!) 149/102  Pulse: (!) 140  Temp: 99 F (37.2 C)  TempSrc: Oral  Weight: 79 lb 12.8 oz (36.2 kg)   Body mass index is 15.08 kg/m.  Physical Exam Constitutional:      Comments: She is cachectic and withdrawn.  She has lost another 10 pounds.  Her mother participated in the second half of my exam.  She is clearly concerned and engaged in Krystal Bowen's care and wants to know what she can do to help.  She was able to tell me that she believes that most of Krystal Bowen's problems are due to her HIV infection.  Cardiovascular:     Rate and Rhythm: Normal rate and regular rhythm.     Heart sounds: No murmur heard.   Pulmonary:     Effort: Pulmonary effort is normal.     Breath sounds: Normal breath sounds.  Abdominal:     Palpations: Abdomen is soft.     Tenderness: There is no abdominal tenderness.  Skin:    Findings: No rash.  Neurological:     General: No focal deficit present.  Psychiatric:     Comments: Flat affect and poor eye contact.     Lab Results Lab Results  Component Value Date   WBC 4.7 08/10/2020   HGB 9.4 (L) 08/10/2020   HCT 29.3 (L) 08/10/2020   MCV 93.0 08/10/2020   PLT 286 08/10/2020    Lab Results  Component Value Date   CREATININE 1.15 (H) 08/10/2020   BUN 25 08/10/2020   NA 135 08/10/2020   K 3.9 08/10/2020   CL 103 08/10/2020   CO2 24 08/10/2020    Lab Results  Component Value Date   ALT 10 08/10/2020   AST 20 08/10/2020   ALKPHOS 77 05/07/2017   BILITOT 0.3 08/10/2020    Lab Results  Component Value Date   CHOL 147 06/08/2020   HDL 27 (L) 06/08/2020   LDLCALC 82 06/08/2020   TRIG 291 (H) 06/08/2020   CHOLHDL 5.4 (H) 06/08/2020   Lab Results  Component Value Date   LABRPR NON-REACTIVE 06/08/2020   HIV 1 RNA Quant  Date Value  08/10/2020 32,500 Copies/mL (H)  06/08/2020 279,000 Copies/mL (H)  10/02/2019 143,000 copies/mL (H)   CD4 T Cell  Abs (/uL)  Date Value  08/10/2020 <35 (L)  06/08/2020 <35 (L)  10/02/2019 <35 (L)     Problem List Items Addressed This Visit      High   HIV disease (HCC)    Ema continues a rapid downward spiral.  This is largely due to ongoing denial about the role advanced, untreated HIV is playing in her illness.  I had a lengthy discussion with them about the need to do a 180 degree,  immediate turnaround and how she understands and manages her HIV infection.  Without this she will continue to decline and probably die within the next 6 months.  I told him repeatedly that if she will work with [place] to consistently take an effective HIV regimen she will improve.  She might be a good candidate for injectable cabotegravir in the near future but we need to stabilize her condition with an oral regimen to make sure that that remains an opportunity for her.  Her depression continues to be a contributing role.  I told them repeatedly to keep things simple and try to understand that all of her current problems are due to HIV not some other elusive illness that we have not yet uncovered.  They agreed to return tomorrow.  I have discussed the situation with my pharmacy partner who will help me review past and potentially future antiretroviral regimens.  I will also have her meet with our behavioral health counselor.  AddendumDarletta Moll, RPH-CPP  Michel Bickers, MD She was diagnosed in 2011 but did not start medications until 2012. She initially started on Atripla but could not tolerate, so she was switched to Viread + Emtriva + Isentress. She stopped taking it soon after starting.   She came to [place] in April 2018 and we started her on Tivicay and Descovy so she could crush them. She only took for a few weeks and was switched to [facility] in August 2018 and told to crush and put in applesauce. 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 In January of 2021, she was  switched to Triumeq because Biktarvy had a "metallic taste". This is where we stand today.   She does have some PI resistance (I put a note in her chart) - potential low level resistance to atazanavir and lopinavir. Darunavir has no resistance. Otherwise, she has no resistance which is a little shocking but maybe not since I bet she doesn't take her medications really at all.    I can't really think of better regimens than Tivicay and Descovy or Biktarvy. These are the smallest we have and the easiest to toleratel. I know the Cabenuva studies technically want you to be undetectable, but we aren't going to get there with her. I am worried about her taking the oral lead-in for Cabenuva which is cabotegravir + rilpivirine once daily with a meal. The rilpivirne is tiny but the cabotegravir is the size of Biktarvy, maybe a little bigger. We, of course, have no crushing data about cabotegravir, so I would be nervous to have her crush it. But, if she has to then I guess she has to.   This is a very tough situation. I dont really know what to put her on that will satisfy her (nothing will because of her mental state). I'm almost ready to say let's try Cabenuva as it may be her last chance, but also nervous about that too. I'm really at a loss for this one!        Relevant Orders   T-helper cell (CD4)- ([facility] only)   HIV-1 RNA quant-no reflex-bld   CBC   Comprehensive metabolic panel   RPR   Lipid panel   HIV-1 genotypr plus   HIV-1 Integrase Genotype   Depression    She will meet with our behavioral health counselor tomorrow.      Unintentional weight loss    I reiterated to her and  her mother that her unintentional weight loss is directly related to poorly treated HIV infection but will improve if she can take her medication correctly and consistently.           [name], MD [facility] [phone] pager   [phone]  cell 10/27/2020, 3:20 PM

## 2020-10-28 ENCOUNTER — Ambulatory Visit: Payer: PRIVATE HEALTH INSURANCE | Admitting: Internal Medicine

## 2020-10-28 ENCOUNTER — Ambulatory Visit: Payer: PRIVATE HEALTH INSURANCE

## 2020-10-28 ENCOUNTER — Telehealth: Payer: Self-pay

## 2020-10-28 LAB — T-HELPER CELL (CD4) - (RCID CLINIC ONLY)
CD4 % Helper T Cell: 4 % — ABNORMAL LOW (ref 33–65)
CD4 T Cell Abs: <35 /uL — ABNORMAL LOW (ref 400–1790)

## 2020-10-28 NOTE — Telephone Encounter (Signed)
Per MD called patient to reschedule missed appointment with office. Patient states that she left a voicemail requesting to reschedule due to her asthma.  Rescheduled patient for Tuesday at 3:45 for 30 min appt. Requested she bring ALL medication with her during this appointment. Rescheduled appointment with counselor for two weeks.  Advised patient to utilize mychart to communicate with office regarding her appointments. Understands office might be able to help with transportation if notified. Patient verbalized understanding.

## 2020-11-02 ENCOUNTER — Other Ambulatory Visit: Payer: Self-pay

## 2020-11-02 ENCOUNTER — Ambulatory Visit (INDEPENDENT_AMBULATORY_CARE_PROVIDER_SITE_OTHER): Payer: PRIVATE HEALTH INSURANCE | Admitting: Internal Medicine

## 2020-11-02 ENCOUNTER — Encounter: Payer: Self-pay | Admitting: Internal Medicine

## 2020-11-02 DIAGNOSIS — B20 Human immunodeficiency virus [HIV] disease: Secondary | ICD-10-CM | POA: Diagnosis not present

## 2020-11-02 NOTE — Assessment & Plan Note (Signed)
I believe that the only chance Krystal Bowen has to improve is with close oversight of her Triumeq dosing by her family. It seems that over the last week her son's involvement has helped. I asked her mother to also help reinforce taking the Triumeq completely every day. I told them that if she will take the Triumeq consistently I firmly believe that she will start to get better. I reviewed her recent lab work with them. I had her sign up again for MyChart and asked them to contact us immediately if she has any difficulty taking Triumeq. She will follow-up in 4 weeks.

## 2020-11-02 NOTE — Progress Notes (Signed)
Patient Active Problem List   Diagnosis Date Noted  . Unintentional weight loss 10/27/2020    Priority: High  . 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 Depression 01/01/2018    Priority: High  . HIV disease (HCC) 02/12/2017    Priority: High  . Unspecified severe protein-calorie malnutrition (HCC) 08/10/2020  . Vulvar lesion 01/22/2019  . Noncompliance w/medication treatment due to intermit use of medication 10/18/2018  . Peripheral neuropathy 02/13/2017  . HTN (hypertension) 02/12/2017  . Normocytic anemia 02/12/2017  . Dyslipidemia 02/12/2017  . History of shingles 02/12/2017    Patient's Medications  New Prescriptions   No medications on file  Previous Medications   ERYTHROMYCIN OPHTHALMIC OINTMENT    Place into the right eye 2 times daily.   GABAPENTIN (NEURONTIN) 600 MG TABLET    Take 300 mg by mouth as needed (pain).    HYDROCHLOROTHIAZIDE (HYDRODIURIL) 25 MG TABLET    TAKE 1 TABLET(25 MG) BY MOUTH DAILY   NYSTATIN (MYCOSTATIN) 100000 UNIT/ML SUSPENSION    Take 5 mLs (500,000 Units total) by mouth 4 (four) times daily.   PREDNISOLONE ACETATE (PRED FORTE) 1 % OPHTHALMIC SUSPENSION    Place 1 drop into the right eye every 2 hours.   SULFAMETHOXAZOLE-TRIMETHOPRIM (BACTRIM) 200-40 MG/5ML SUSPENSION    Take 20 mLs by mouth daily.   TRIUMEQ 600-50-300 MG TABLET    TAKE 1 TABLET BY MOUTH DAILY   VALGANCICLOVIR (VALCYTE) 450 MG TABLET    Take 1 tablet (450 mg total) by mouth daily.  Modified Medications   No medications on file  Discontinued Medications   No medications on file    Subjective: Krystal Bowen is in for her routine HIV follow-up visit.  She continues to have problems with depression, fatigue, anorexia, intermittent diarrhea and weight loss.  Her vision is stable.  She is currently taking Triumeq.  She crushes it up and takes it with applesauce and some sprinkled cinnamon.  She says that she does not have trouble swallowing. At her visit last week  she estimated that she only takes her Triumeq 3-4 times each week. Since that visit she says that her son has been helping her to crush the Triumeq and she has not missed a single dose. She is still not eating well and continues to lose weight. She asked me if it is okay to drink Boost.  Review of Systems: Review of Systems  Constitutional: Positive for malaise/fatigue and weight loss. Negative for chills, diaphoresis and fever.  HENT: Negative for sore throat.   Respiratory: Negative for cough, sputum production and shortness of breath.   Cardiovascular: Negative for chest pain.  Gastrointestinal: Positive for diarrhea. Negative for abdominal pain, heartburn, nausea and vomiting.  Genitourinary: Negative for dysuria and frequency.  Musculoskeletal: Negative for joint pain and myalgias.  Skin: Negative for rash.  Neurological: Negative for dizziness and headaches.  Psychiatric/Behavioral: Positive for depression. Negative for substance abuse. The patient is not nervous/anxious.     Past Medical History:  Diagnosis Date  . Anemia   . HIV infection (HCC)   . Hyperlipidemia   . Hypertension   . Neuropathy     Social History   Tobacco Use  . Smoking status: Never Smoker  . Smokeless tobacco: Never Used  Vaping Use  . Vaping Use: Never used  Substance Use Topics  . Alcohol use: Not Currently    Comment: social  . Drug use: No  Family History  Problem Relation Age of Onset  . Heart attack Mother   . Stroke Mother   . Hyperlipidemia Mother   . Cancer Father        Leukemia   . Hyperlipidemia Maternal Grandmother   . Alzheimer's disease Maternal Grandmother   . Heart attack Maternal Grandfather     No Known Allergies  Health Maintenance  Topic Date Due  . COLONOSCOPY (Pts 45-67yrs Insurance coverage will need to be confirmed)  Never done  . COVID-19 Vaccine (3 - Pfizer risk 4-dose series) 04/20/2020  . PAP SMEAR-Modifier  05/03/2020  . TETANUS/TDAP  09/09/2020  .  INFLUENZA VACCINE  Completed  . Hepatitis C Screening  Completed  . HIV Screening  Completed    Objective:  Vitals:   11/02/20 1613  Weight: 75 lb (34 kg)   Body mass index is 14.17 kg/m.  Physical Exam Constitutional:      Comments: Her weight is down a few more pounds over the past week. She seems to be in slightly better spirits and has better eye contact. Her mother is with her today and clearly very engaged in her care.  Cardiovascular:     Rate and Rhythm: Normal rate and regular rhythm.     Heart sounds: No murmur heard.   Pulmonary:     Effort: Pulmonary effort is normal.     Breath sounds: Normal breath sounds.  Abdominal:     Palpations: Abdomen is soft.     Tenderness: There is no abdominal tenderness.  Skin:    Findings: No rash.  Neurological:     General: No focal deficit present.     Lab Results Lab Results  Component Value Date   WBC 8.1 10/27/2020   HGB 10.7 (L) 10/27/2020   HCT 33.7 (L) 10/27/2020   MCV 94.4 10/27/2020   PLT 34 (L) 10/27/2020    Lab Results  Component Value Date   CREATININE 1.33 (H) 10/27/2020   BUN 21 10/27/2020   NA 145 10/27/2020   K 3.7 10/27/2020   CL 108 10/27/2020   CO2 25 10/27/2020    Lab Results  Component Value Date   ALT 6 10/27/2020   AST 21 10/27/2020   ALKPHOS 77 05/07/2017   BILITOT 0.4 10/27/2020    Lab Results  Component Value Date   CHOL 130 10/27/2020   HDL 29 (L) 10/27/2020   LDLCALC 71 10/27/2020   TRIG 199 (H) 10/27/2020   CHOLHDL 4.5 10/27/2020   Lab Results  Component Value Date   LABRPR NON-REACTIVE 10/27/2020   HIV 1 RNA Quant  Date Value  08/10/2020 32,500 Copies/mL (H)  06/08/2020 279,000 Copies/mL (H)  10/02/2019 143,000 copies/mL (H)   CD4 T Cell Abs (/uL)  Date Value  10/27/2020 <35 (L)  08/10/2020 <35 (L)  06/08/2020 <35 (L)     Problem List Items Addressed This Visit      High   HIV disease (HCC)    I believe that the only chance Krystal Bowen has to improve is with  close oversight of her Triumeq dosing by her family. It seems that over the last week her son's involvement has helped. I asked her mother to also help reinforce taking the Triumeq completely every day. I told them that if she will take the Triumeq consistently I firmly believe that she will start to get better. I reviewed her recent lab work with them. I had her sign up again for MyChart and asked them to contact  us immediately if she has any difficulty taking Triumeq. She will follow-up in 4 weeks.           [name], MD [facility] [phone] pager   [phone] cell 11/02/2020, 5:06 PM

## 2020-11-07 LAB — COMPREHENSIVE METABOLIC PANEL
AG Ratio: 0.5 (calc) — ABNORMAL LOW (ref 1.0–2.5)
ALT: 6 U/L (ref 6–29)
AST: 21 U/L (ref 10–35)
Albumin: 2.6 g/dL — ABNORMAL LOW (ref 3.6–5.1)
Alkaline phosphatase (APISO): 97 U/L (ref 31–125)
BUN/Creatinine Ratio: 16 (calc) (ref 6–22)
BUN: 21 mg/dL (ref 7–25)
CO2: 25 mmol/L (ref 20–32)
Calcium: 8.5 mg/dL — ABNORMAL LOW (ref 8.6–10.2)
Chloride: 108 mmol/L (ref 98–110)
Creat: 1.33 mg/dL — ABNORMAL HIGH (ref 0.50–1.10)
Globulin: 5.3 g/dL (calc) — ABNORMAL HIGH (ref 1.9–3.7)
Glucose, Bld: 98 mg/dL (ref 65–99)
Potassium: 3.7 mmol/L (ref 3.5–5.3)
Sodium: 145 mmol/L (ref 135–146)
Total Bilirubin: 0.4 mg/dL (ref 0.2–1.2)
Total Protein: 7.9 g/dL (ref 6.1–8.1)

## 2020-11-07 LAB — CBC
HCT: 33.7 % — ABNORMAL LOW (ref 35.0–45.0)
Hemoglobin: 10.7 g/dL — ABNORMAL LOW (ref 11.7–15.5)
MCH: 30 pg (ref 27.0–33.0)
MCHC: 31.8 g/dL — ABNORMAL LOW (ref 32.0–36.0)
MCV: 94.4 fL (ref 80.0–100.0)
Platelets: 34 10*3/uL — ABNORMAL LOW (ref 140–400)
RBC: 3.57 10*6/uL — ABNORMAL LOW (ref 3.80–5.10)
RDW: 14.4 % (ref 11.0–15.0)
WBC: 8.1 10*3/uL (ref 3.8–10.8)

## 2020-11-07 LAB — LIPID PANEL
Cholesterol: 130 mg/dL (ref <200)
HDL: 29 mg/dL — ABNORMAL LOW (ref >=50)
LDL Cholesterol (Calc): 71 mg/dL (calc)
Non-HDL Cholesterol (Calc): 101 mg/dL (calc) (ref <130)
Total CHOL/HDL Ratio: 4.5 (calc) (ref <5.0)
Triglycerides: 199 mg/dL — ABNORMAL HIGH (ref <150)

## 2020-11-07 LAB — HIV-1 INTEGRASE GENOTYPE

## 2020-11-07 LAB — HIV-1 RNA QUANT-NO REFLEX-BLD
HIV 1 RNA Quant: 67600 Copies/mL — ABNORMAL HIGH
HIV-1 RNA Quant, Log: 4.83 Log cps/mL — ABNORMAL HIGH

## 2020-11-07 LAB — HIV-1 GENOTYPE: HIV-1 Genotype: DETECTED — AB

## 2020-11-07 LAB — RPR: RPR Ser Ql: NONREACTIVE

## 2020-11-09 ENCOUNTER — Other Ambulatory Visit: Payer: Self-pay | Admitting: Internal Medicine

## 2020-11-09 ENCOUNTER — Other Ambulatory Visit: Payer: Self-pay

## 2020-11-09 ENCOUNTER — Ambulatory Visit: Payer: PRIVATE HEALTH INSURANCE

## 2020-11-09 DIAGNOSIS — F332 Major depressive disorder, recurrent severe without psychotic features: Secondary | ICD-10-CM

## 2020-11-09 DIAGNOSIS — G4700 Insomnia, unspecified: Secondary | ICD-10-CM | POA: Insufficient documentation

## 2020-11-09 MED ORDER — AMITRIPTYLINE HCL 25 MG PO TABS: 25.0000 mg | ORAL_TABLET | Freq: Every evening | ORAL | 3 refills | Status: DC | PRN

## 2020-11-09 NOTE — Progress Notes (Signed)
Mental Health Therapist Progress Note   Name: Krystal Bowen  Total time: 30  Type of Service: Individual Outpatient Mental Health Therapy  OBJECTIVE:  Mood: Euthymic and Affect: Appropriate Risk of harm to self or others: No plan to harm self or others  DIAGNOSIS: MDD, Recurrent Severe  GOALS ADDRESSED:  Patient will: 1.  Reduce symptoms of: depression  2.  Increase knowledge and/or ability of: healthy habits  3.  Demonstrate ability to: Increase motivation to adhere to plan of care  INTERVENTIONS: Interventions utilized:  Supportive Counseling   Therapist met with patient for outpatient mental health individual therapy to include ongoing assessment, support, and reinforcement.  Therapist allowed patient to "check in" since previous session; asking patient to share any positive coping skills they may have used over the previous week, along with any challenges faced.  Therapist provided supportive listening as patient processed their thoughts, emotional responses, and behaviors surrounding several stressors.  Krystal Bowen shared that she has been struggling with lack of sleep; she shared that she will lie down at approx 8-9pm but that she "never really falls asleep."  Krystal Bowen shared that she continues to have significant bouts of sadness and that today was a particularly hard day. Krystal Bowen agreed to work on rewarding herself for taking HIV med. Krystal Bowen shared that she loves chocolate and would "reward" herself with a chocolate bar every time she takes her medication. Therapist and client explored how Krystal Bowen might feel if she got to an undetectable status; she states that she is taking her medication every day with the help of family members,  EFFECTIVENESS/PLAN: Client was alert, oriented x3, with no SI, HI, or symptoms of psychosis (risk low).  Client was pleasant and friendly, engaging openly and appropriately with therapist, benefiting from supportive listening and exploration of feelings.  Next  session recommended in one to two weeks.   Hughes Better, LCSW

## 2020-11-16 ENCOUNTER — Inpatient Hospital Stay (HOSPITAL_COMMUNITY)
Admission: EM | Admit: 2020-11-16 | Discharge: 2020-11-27 | DRG: 974 | Disposition: A | Discharge status: Hospice | Payer: PRIVATE HEALTH INSURANCE | Attending: Student | Admitting: Student

## 2020-11-16 ENCOUNTER — Encounter (HOSPITAL_COMMUNITY): Payer: Self-pay | Admitting: Emergency Medicine

## 2020-11-16 ENCOUNTER — Emergency Department (HOSPITAL_COMMUNITY): Payer: PRIVATE HEALTH INSURANCE

## 2020-11-16 DIAGNOSIS — Z9114 Patient's other noncompliance with medication regimen: Secondary | ICD-10-CM | POA: Diagnosis not present

## 2020-11-16 DIAGNOSIS — R6520 Severe sepsis without septic shock: Secondary | ICD-10-CM | POA: Diagnosis not present

## 2020-11-16 DIAGNOSIS — J9601 Acute respiratory failure with hypoxia: Secondary | ICD-10-CM | POA: Diagnosis present

## 2020-11-16 DIAGNOSIS — Z681 Body mass index (BMI) 19 or less, adult: Secondary | ICD-10-CM

## 2020-11-16 DIAGNOSIS — D696 Thrombocytopenia, unspecified: Secondary | ICD-10-CM | POA: Diagnosis not present

## 2020-11-16 DIAGNOSIS — I10 Essential (primary) hypertension: Secondary | ICD-10-CM | POA: Diagnosis present

## 2020-11-16 DIAGNOSIS — B20 Human immunodeficiency virus [HIV] disease: Secondary | ICD-10-CM | POA: Diagnosis present

## 2020-11-16 DIAGNOSIS — W1830XA Fall on same level, unspecified, initial encounter: Secondary | ICD-10-CM | POA: Diagnosis present

## 2020-11-16 DIAGNOSIS — D539 Nutritional anemia, unspecified: Secondary | ICD-10-CM | POA: Diagnosis present

## 2020-11-16 DIAGNOSIS — B258 Other cytomegaloviral diseases: Secondary | ICD-10-CM | POA: Diagnosis present

## 2020-11-16 DIAGNOSIS — R0602 Shortness of breath: Secondary | ICD-10-CM | POA: Diagnosis not present

## 2020-11-16 DIAGNOSIS — E43 Unspecified severe protein-calorie malnutrition: Secondary | ICD-10-CM

## 2020-11-16 DIAGNOSIS — R0609 Other forms of dyspnea: Secondary | ICD-10-CM | POA: Diagnosis not present

## 2020-11-16 DIAGNOSIS — R64 Cachexia: Secondary | ICD-10-CM | POA: Diagnosis not present

## 2020-11-16 DIAGNOSIS — H3090 Unspecified chorioretinal inflammation, unspecified eye: Secondary | ICD-10-CM | POA: Diagnosis not present

## 2020-11-16 DIAGNOSIS — Z20822 Contact with and (suspected) exposure to covid-19: Secondary | ICD-10-CM | POA: Diagnosis present

## 2020-11-16 DIAGNOSIS — F410 Panic disorder [episodic paroxysmal anxiety] without agoraphobia: Secondary | ICD-10-CM | POA: Diagnosis not present

## 2020-11-16 DIAGNOSIS — D638 Anemia in other chronic diseases classified elsewhere: Secondary | ICD-10-CM | POA: Diagnosis present

## 2020-11-16 DIAGNOSIS — H30891 Other chorioretinal inflammations, right eye: Secondary | ICD-10-CM | POA: Diagnosis present

## 2020-11-16 DIAGNOSIS — Z515 Encounter for palliative care: Secondary | ICD-10-CM

## 2020-11-16 DIAGNOSIS — F4323 Adjustment disorder with mixed anxiety and depressed mood: Secondary | ICD-10-CM | POA: Diagnosis present

## 2020-11-16 DIAGNOSIS — Z66 Do not resuscitate: Secondary | ICD-10-CM | POA: Diagnosis present

## 2020-11-16 DIAGNOSIS — H5461 Unqualified visual loss, right eye, normal vision left eye: Secondary | ICD-10-CM | POA: Diagnosis present

## 2020-11-16 DIAGNOSIS — J181 Lobar pneumonia, unspecified organism: Secondary | ICD-10-CM

## 2020-11-16 DIAGNOSIS — J189 Pneumonia, unspecified organism: Secondary | ICD-10-CM | POA: Diagnosis present

## 2020-11-16 DIAGNOSIS — Z9119 Patient's noncompliance with other medical treatment and regimen: Secondary | ICD-10-CM

## 2020-11-16 DIAGNOSIS — E785 Hyperlipidemia, unspecified: Secondary | ICD-10-CM | POA: Diagnosis present

## 2020-11-16 DIAGNOSIS — E860 Dehydration: Secondary | ICD-10-CM | POA: Diagnosis present

## 2020-11-16 DIAGNOSIS — B370 Candidal stomatitis: Secondary | ICD-10-CM | POA: Diagnosis present

## 2020-11-16 DIAGNOSIS — F32A Depression, unspecified: Secondary | ICD-10-CM | POA: Diagnosis present

## 2020-11-16 DIAGNOSIS — B59 Pneumocystosis: Secondary | ICD-10-CM | POA: Diagnosis present

## 2020-11-16 DIAGNOSIS — D509 Iron deficiency anemia, unspecified: Secondary | ICD-10-CM | POA: Diagnosis present

## 2020-11-16 DIAGNOSIS — Z83438 Family history of other disorder of lipoprotein metabolism and other lipidemia: Secondary | ICD-10-CM | POA: Diagnosis not present

## 2020-11-16 DIAGNOSIS — H3092 Unspecified chorioretinal inflammation, left eye: Secondary | ICD-10-CM | POA: Diagnosis not present

## 2020-11-16 DIAGNOSIS — Z79899 Other long term (current) drug therapy: Secondary | ICD-10-CM

## 2020-11-16 DIAGNOSIS — J9600 Acute respiratory failure, unspecified whether with hypoxia or hypercapnia: Secondary | ICD-10-CM | POA: Diagnosis not present

## 2020-11-16 DIAGNOSIS — R0902 Hypoxemia: Secondary | ICD-10-CM | POA: Diagnosis present

## 2020-11-16 DIAGNOSIS — Z7189 Other specified counseling: Secondary | ICD-10-CM | POA: Diagnosis not present

## 2020-11-16 DIAGNOSIS — Z8249 Family history of ischemic heart disease and other diseases of the circulatory system: Secondary | ICD-10-CM

## 2020-11-16 DIAGNOSIS — B259 Cytomegaloviral disease, unspecified: Secondary | ICD-10-CM | POA: Diagnosis not present

## 2020-11-16 DIAGNOSIS — E876 Hypokalemia: Secondary | ICD-10-CM | POA: Diagnosis not present

## 2020-11-16 DIAGNOSIS — N179 Acute kidney failure, unspecified: Secondary | ICD-10-CM | POA: Diagnosis present

## 2020-11-16 DIAGNOSIS — A419 Sepsis, unspecified organism: Secondary | ICD-10-CM | POA: Diagnosis present

## 2020-11-16 DIAGNOSIS — E875 Hyperkalemia: Secondary | ICD-10-CM | POA: Diagnosis not present

## 2020-11-16 DIAGNOSIS — F332 Major depressive disorder, recurrent severe without psychotic features: Secondary | ICD-10-CM | POA: Diagnosis not present

## 2020-11-16 LAB — I-STAT VENOUS BLOOD GAS, ED
Acid-Base Excess: 3 mmol/L — ABNORMAL HIGH (ref 0.0–2.0)
Bicarbonate: 27.9 mmol/L (ref 20.0–28.0)
Calcium, Ion: 1.16 mmol/L (ref 1.15–1.40)
HCT: 35 % — ABNORMAL LOW (ref 36.0–46.0)
Hemoglobin: 11.9 g/dL — ABNORMAL LOW (ref 12.0–15.0)
O2 Saturation: 58 %
Potassium: 4.8 mmol/L (ref 3.5–5.1)
Sodium: 146 mmol/L — ABNORMAL HIGH (ref 135–145)
TCO2: 29 mmol/L (ref 22–32)
pCO2, Ven: 42.8 mmHg — ABNORMAL LOW (ref 44.0–60.0)
pH, Ven: 7.423 (ref 7.250–7.430)
pO2, Ven: 30 mmHg — CL (ref 32.0–45.0)

## 2020-11-16 LAB — COMPREHENSIVE METABOLIC PANEL
ALT: 14 U/L (ref 0–44)
AST: 33 U/L (ref 15–41)
Albumin: 1.9 g/dL — ABNORMAL LOW (ref 3.5–5.0)
Alkaline Phosphatase: 82 U/L (ref 38–126)
Anion gap: 14 (ref 5–15)
BUN: 44 mg/dL — ABNORMAL HIGH (ref 6–20)
CO2: 23 mmol/L (ref 22–32)
Calcium: 8.5 mg/dL — ABNORMAL LOW (ref 8.9–10.3)
Chloride: 106 mmol/L (ref 98–111)
Creatinine, Ser: 1.89 mg/dL — ABNORMAL HIGH (ref 0.44–1.00)
GFR, Estimated: 32 mL/min — ABNORMAL LOW (ref >60)
Glucose, Bld: 137 mg/dL — ABNORMAL HIGH (ref 70–99)
Potassium: 4.7 mmol/L (ref 3.5–5.1)
Sodium: 143 mmol/L (ref 135–145)
Total Bilirubin: 0.4 mg/dL (ref 0.3–1.2)
Total Protein: 7.6 g/dL (ref 6.5–8.1)

## 2020-11-16 LAB — CBC WITH DIFFERENTIAL/PLATELET
Abs Immature Granulocytes: 0.78 10*3/uL — ABNORMAL HIGH (ref 0.00–0.07)
Basophils Absolute: 0 10*3/uL (ref 0.0–0.1)
Basophils Relative: 0 %
Eosinophils Absolute: 0 10*3/uL (ref 0.0–0.5)
Eosinophils Relative: 0 %
HCT: 38.6 % (ref 36.0–46.0)
Hemoglobin: 11.4 g/dL — ABNORMAL LOW (ref 12.0–15.0)
Immature Granulocytes: 8 %
Lymphocytes Relative: 3 %
Lymphs Abs: 0.3 10*3/uL — ABNORMAL LOW (ref 0.7–4.0)
MCH: 30.6 pg (ref 26.0–34.0)
MCHC: 29.5 g/dL — ABNORMAL LOW (ref 30.0–36.0)
MCV: 103.8 fL — ABNORMAL HIGH (ref 80.0–100.0)
Monocytes Absolute: 0.5 10*3/uL (ref 0.1–1.0)
Monocytes Relative: 6 %
Neutro Abs: 7.7 10*3/uL (ref 1.7–7.7)
Neutrophils Relative %: 83 %
Platelets: UNDETERMINED 10*3/uL (ref 150–400)
RBC: 3.72 MIL/uL — ABNORMAL LOW (ref 3.87–5.11)
RDW: 17.3 % — ABNORMAL HIGH (ref 11.5–15.5)
WBC: 9.2 10*3/uL (ref 4.0–10.5)
nRBC: 0 % (ref 0.0–0.2)

## 2020-11-16 LAB — SARS CORONAVIRUS 2 BY RT PCR (HOSPITAL ORDER, PERFORMED IN ~~LOC~~ HOSPITAL LAB): SARS Coronavirus 2: NEGATIVE

## 2020-11-16 LAB — LACTIC ACID, PLASMA
Lactic Acid, Venous: 3 mmol/L (ref 0.5–1.9)
Lactic Acid, Venous: 1.6 mmol/L (ref 0.5–1.9)

## 2020-11-16 LAB — BRAIN NATRIURETIC PEPTIDE: B Natriuretic Peptide: 33.4 pg/mL (ref 0.0–100.0)

## 2020-11-16 LAB — TROPONIN I (HIGH SENSITIVITY)
Troponin I (High Sensitivity): 8 ng/L (ref <18)
Troponin I (High Sensitivity): 8 ng/L (ref <18)

## 2020-11-16 LAB — D-DIMER, QUANTITATIVE: D-Dimer, Quant: 1.98 ug/mL-FEU — ABNORMAL HIGH (ref 0.00–0.50)

## 2020-11-16 MED ORDER — VALGANCICLOVIR HCL 450 MG PO TABS
450.0000 mg | ORAL_TABLET | ORAL | Status: DC
  Administered 2020-11-18: 450 mg via ORAL
  Filled 2020-11-16 (×2): qty 1

## 2020-11-16 MED ORDER — ONDANSETRON HCL 4 MG/2ML IJ SOLN: 4.0000 mg | Freq: Four times a day (QID) | INTRAMUSCULAR | Status: DC | PRN

## 2020-11-16 MED ORDER — FLUCONAZOLE 100 MG PO TABS
400.0000 mg | ORAL_TABLET | Freq: Every day | ORAL | Status: DC
  Administered 2020-11-16 – 2020-11-17 (×2): 400 mg via ORAL
  Filled 2020-11-16: qty 4
  Filled 2020-11-16 (×2): qty 2

## 2020-11-16 MED ORDER — LACTATED RINGERS IV SOLN
INTRAVENOUS | Status: DC
  Administered 2020-11-17: 1 mL via INTRAVENOUS

## 2020-11-16 MED ORDER — ENOXAPARIN SODIUM 40 MG/0.4ML ~~LOC~~ SOLN: 40.0000 mg | SUBCUTANEOUS | Status: DC

## 2020-11-16 MED ORDER — SULFAMETHOXAZOLE-TRIMETHOPRIM 400-80 MG/5ML IV SOLN
5.0000 mg/kg | Freq: Two times a day (BID) | INTRAVENOUS | Status: DC
  Administered 2020-11-16 – 2020-11-19 (×6): 170.08 mg via INTRAVENOUS
  Filled 2020-11-16 (×9): qty 10.63

## 2020-11-16 MED ORDER — HEPARIN SODIUM (PORCINE) 5000 UNIT/ML IJ SOLN
5000.0000 [IU] | Freq: Three times a day (TID) | INTRAMUSCULAR | Status: DC
  Administered 2020-11-16 – 2020-11-23 (×18): 5000 [IU] via SUBCUTANEOUS
  Filled 2020-11-16 (×18): qty 1

## 2020-11-16 MED ORDER — OXYCODONE HCL 5 MG PO TABS
5.0000 mg | ORAL_TABLET | ORAL | Status: DC | PRN
  Administered 2020-11-16 – 2020-11-19 (×3): 5 mg via ORAL
  Filled 2020-11-16 (×3): qty 1

## 2020-11-16 MED ORDER — METRONIDAZOLE IN NACL 5-0.79 MG/ML-% IV SOLN: 500.0000 mg | Freq: Three times a day (TID) | INTRAVENOUS | Status: DC

## 2020-11-16 MED ORDER — VALGANCICLOVIR HCL 450 MG PO TABS
450.0000 mg | ORAL_TABLET | Freq: Two times a day (BID) | ORAL | Status: DC
  Filled 2020-11-16: qty 1

## 2020-11-16 MED ORDER — DEXAMETHASONE SODIUM PHOSPHATE 10 MG/ML IJ SOLN
10.0000 mg | Freq: Once | INTRAMUSCULAR | Status: AC
  Administered 2020-11-16: 10 mg via INTRAVENOUS
  Filled 2020-11-16: qty 1

## 2020-11-16 MED ORDER — ACETAMINOPHEN 325 MG PO TABS
650.0000 mg | ORAL_TABLET | Freq: Four times a day (QID) | ORAL | Status: DC | PRN
  Administered 2020-11-17: 650 mg via ORAL
  Filled 2020-11-16 (×2): qty 2

## 2020-11-16 MED ORDER — SODIUM CHLORIDE 0.9 % IV SOLN: 2.0000 g | Freq: Once | INTRAVENOUS | Status: DC

## 2020-11-16 MED ORDER — METHYLPREDNISOLONE SODIUM SUCC 125 MG IJ SOLR
60.0000 mg | Freq: Two times a day (BID) | INTRAMUSCULAR | Status: DC
  Administered 2020-11-16 – 2020-11-19 (×6): 60 mg via INTRAVENOUS
  Filled 2020-11-16 (×6): qty 2

## 2020-11-16 MED ORDER — VANCOMYCIN HCL IN DEXTROSE 1-5 GM/200ML-% IV SOLN: 1000.0000 mg | Freq: Once | INTRAVENOUS | Status: DC

## 2020-11-16 MED ORDER — KETOROLAC TROMETHAMINE 15 MG/ML IJ SOLN: 15.0000 mg | Freq: Four times a day (QID) | INTRAMUSCULAR | Status: DC | PRN

## 2020-11-16 MED ORDER — ONDANSETRON HCL 4 MG PO TABS: 4.0000 mg | ORAL_TABLET | Freq: Four times a day (QID) | ORAL | Status: DC | PRN

## 2020-11-16 MED ORDER — SODIUM CHLORIDE 0.9% FLUSH
3.0000 mL | Freq: Two times a day (BID) | INTRAVENOUS | Status: DC
  Administered 2020-11-16 – 2020-11-27 (×17): 3 mL via INTRAVENOUS

## 2020-11-16 MED ORDER — SULFAMETHOXAZOLE-TRIMETHOPRIM 200-40 MG/5ML PO SUSP: 20.0000 mL | Freq: Every day | ORAL | Status: DC

## 2020-11-16 MED ORDER — ABACAVIR-DOLUTEGRAVIR-LAMIVUD 600-50-300 MG PO TABS
1.0000 | ORAL_TABLET | Freq: Every day | ORAL | Status: DC
  Administered 2020-11-16 – 2020-11-20 (×5): 1 via ORAL
  Filled 2020-11-16 (×5): qty 1

## 2020-11-16 MED ORDER — SODIUM CHLORIDE 0.9 % IV BOLUS
1000.0000 mL | Freq: Once | INTRAVENOUS | Status: AC
  Administered 2020-11-16: 1000 mL via INTRAVENOUS

## 2020-11-16 MED ORDER — ACETAMINOPHEN 650 MG RE SUPP: 650.0000 mg | Freq: Four times a day (QID) | RECTAL | Status: DC | PRN

## 2020-11-16 NOTE — Consult Note (Addendum)
[facility]  Total days of antibiotics 1               Reason for Consult: hiv disease    Referring Physician: yates  Active Problems:   Acute respiratory failure with hypoxia (HCC)    HPI: Krystal Bowen is a 50 y.o. 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 The patient reports that she was brought in due to having a ground level fall with injury to her hip however she was found to have hypoxia with saturation of 60% on RA, also found to have tachycardia on vitals, but remains afebrile. Xray of hip ruled out fracture but CXR is concerning for patchy multifocal infiltrates.  Past Medical History:  Diagnosis Date  . Anemia   . HIV infection (HCC)   . Hyperlipidemia   . Hypertension   . Neuropathy     Allergies: No Known Allergies    MEDICATIONS:   Social History   Tobacco Use  . Smoking status: Never Smoker  . Smokeless tobacco: Never Used  Vaping Use  . Vaping Use: Never used  Substance Use Topics  . Alcohol use: Not Currently    Comment: social  . Drug use: No    Family History  Problem Relation Age of Onset  . Heart attack Mother   . Stroke Mother   . Hyperlipidemia Mother   . Cancer Father        Leukemia   . Hyperlipidemia Maternal Grandmother   . Alzheimer's disease Maternal Grandmother   . Heart attack Maternal Grandfather     Review of Systems -   Constitutional: Negative for fever, chills, diaphoresis, activity change, appetite change, fatigue and unexpected weight change.  HENT: Negative for congestion, sore throat, rhinorrhea, sneezing, trouble swallowing and sinus pressure.  Eyes: Negative for photophobia and visual disturbance.  Respiratory: Negative for cough, chest tightness, shortness of breath, wheezing and stridor.  Cardiovascular: Negative for chest pain, palpitations and  leg swelling.  Gastrointestinal: Negative for nausea, vomiting, abdominal pain, diarrhea, constipation, blood in stool, abdominal distention and anal bleeding.  Genitourinary: Negative for dysuria, hematuria, flank pain and difficulty urinating.  Musculoskeletal: Negative for myalgias, back pain, joint swelling, arthralgias and gait problem.  Skin: Negative for color change, pallor, rash and wound.  Neurological: Negative for dizziness, tremors, weakness and light-headedness.  Hematological: Negative for adenopathy. Does not bruise/bleed easily.  Psychiatric/Behavioral: Negative for behavioral problems, confusion, sleep disturbance, dysphoric mood, decreased concentration and agitation.     OBJECTIVE: Temp:  [98.2 F (36.8 C)-98.4 F (36.9 C)] 98.2 F (36.8 C) (01/25 1254) Pulse Rate:  [30-139] 44 (01/25 1413) Resp:  [28-49] 38 (01/25 1415) BP: (91-130)/(75-90) 100/82 (01/25 1415) SpO2:  [90 %-99 %] 99 % (01/25 1413) Physical Exam  Constitutional:  oriented to person, place, and time. appears severely wasted and mal-nourished. No distress.  HENT: [place]/AT, PERRLA, no scleral icterus Mouth/Throat: Oropharynx is clear and moist. +thrush Cardiovascular: Normal rate, regular rhythm and normal heart sounds. Exam reveals no gallop and no friction rub.  No murmur heard.  Pulmonary/Chest: Effort normal and breath sounds normal. No respiratory distress.  has no wheezes.  Neck = supple, no nuchal rigidity Abdominal: Soft. Bowel sounds are normal.  exhibits no distension. There is no tenderness.  Lymphadenopathy: no cervical adenopathy. No axillary adenopathy Neurological: alert and oriented to  person, place, and time.  Skin: Skin is warm and dry. No rash noted. No erythema.  Ext: muscle wasting Psychiatric: a normal mood and affect.  behavior is normal.     LABS: Results for orders placed or performed during the hospital encounter of 11/16/20 (from the past 48 hour(s))  SARS Coronavirus 2  by RT PCR (hospital order, performed in [facility] hospital lab) Nasopharyngeal Nasopharyngeal Swab     Status: None   Collection Time: 11/16/20 10:06 AM   Specimen: Nasopharyngeal Swab  Result Value Ref Range   SARS Coronavirus 2 NEGATIVE NEGATIVE    Comment: (NOTE) SARS-CoV-2 target nucleic acids are NOT DETECTED.  The SARS-CoV-2 RNA is generally detectable in upper and lower respiratory specimens during the acute phase of infection. The lowest concentration of SARS-CoV-2 viral copies this assay can detect is 250 copies / mL. A negative result does not preclude SARS-CoV-2 infection and should not be used as the sole basis for treatment or other patient management decisions.  A negative result may occur with improper specimen collection / handling, submission of specimen other than nasopharyngeal swab, presence of viral mutation(s) within the areas targeted by this assay, and inadequate number of viral copies (<250 copies / mL). A negative result must be combined with clinical observations, patient history, and epidemiological information.  Fact Sheet for Patients:   BoilerBrush.com.cy  Fact Sheet for Healthcare Providers: https://pope.com/  This test is not yet approved or  cleared by the [place] FDA and has been authorized for detection and/or diagnosis of SARS-CoV-2 by FDA under an Emergency Use Authorization (EUA).  This EUA will remain in effect (meaning this test can be used) for the duration of the COVID-19 declaration under Section 564(b)(1) of the Act, 21 U.S.C. section 360bbb-3(b)(1), unless the authorization is terminated or revoked sooner.  Performed at [facility], [address], [place], [place] [address]   CBC with Differential     Status: Abnormal   Collection Time: 11/16/20 11:48 AM  Result Value Ref Range   WBC 9.2 4.0 - 10.5 K/uL   RBC 3.72 (L) 3.87 - 5.11 MIL/uL   Hemoglobin 11.4 (L) 12.0 -  15.0 g/dL   HCT 62.5 63.8 - 93.7 %   MCV 103.8 (H) 80.0 - 100.0 fL   MCH 30.6 26.0 - 34.0 pg   MCHC 29.5 (L) 30.0 - 36.0 g/dL   RDW 34.2 (H) 87.6 - 81.1 %   Platelets PLATELET CLUMPS NOTED ON SMEAR, UNABLE TO ESTIMATE 150 - 400 K/uL    Comment: PLATELET CLUMPING, SUGGEST RECOLLECTION OF SAMPLE IN CITRATE TUBE. Immature Platelet Fraction may be clinically indicated, consider ordering this additional test [id number]    nRBC 0.0 0.0 - 0.2 %   Neutrophils Relative % 83 %   Neutro Abs 7.7 1.7 - 7.7 K/uL   Lymphocytes Relative 3 %   Lymphs Abs 0.3 (L) 0.7 - 4.0 K/uL   Monocytes Relative 6 %   Monocytes Absolute 0.5 0.1 - 1.0 K/uL   Eosinophils Relative 0 %   Eosinophils Absolute 0.0 0.0 - 0.5 K/uL   Basophils Relative 0 %   Basophils Absolute 0.0 0.0 - 0.1 K/uL   Immature Granulocytes 8 %   Abs Immature Granulocytes 0.78 (H) 0.00 - 0.07 K/uL    Comment: Performed at [facility], [address] [place], [place] [address]  Comprehensive metabolic panel     Status: Abnormal   Collection Time: 11/16/20 11:48 AM  Result Value  Ref Range   Sodium 143 135 - 145 mmol/L   Potassium 4.7 3.5 - 5.1 mmol/L   Chloride 106 98 - 111 mmol/L   CO2 23 22 - 32 mmol/L   Glucose, Bld 137 (H) 70 - 99 mg/dL    Comment: Glucose reference range applies only to samples taken after fasting for at least 8 hours.   BUN 44 (H) 6 - 20 mg/dL   Creatinine, Ser 1.611.89 (H) 0.44 - 1.00 mg/dL   Calcium 8.5 (L) 8.9 - 10.3 mg/dL   Total Protein 7.6 6.5 - 8.1 g/dL   Albumin 1.9 (L) 3.5 - 5.0 g/dL   AST 33 15 - 41 U/L   ALT 14 0 - 44 U/L   Alkaline Phosphatase 82 38 - 126 U/L   Total Bilirubin 0.4 0.3 - 1.2 mg/dL   GFR, Estimated 32 (L) >60 mL/min    Comment: (NOTE) Calculated using the CKD-EPI Creatinine Equation (2021)    Anion gap 14 5 - 15    Comment: Performed at [facility], [address] [place], [place] [address]  Troponin I (High Sensitivity)     Status: None   Collection Time: 11/16/20  11:48 AM  Result Value Ref Range   Troponin I (High Sensitivity) 8 <18 ng/L    Comment: (NOTE) Elevated high sensitivity troponin I (hsTnI) values and significant  changes across serial measurements may suggest ACS but many other  chronic and acute conditions are known to elevate hsTnI results.  Refer to the "Links" section for chest pain algorithms and additional  guidance. Performed at [facility], [address] [place], [place] [address]   Brain natriuretic peptide     Status: None   Collection Time: 11/16/20 11:48 AM  Result Value Ref Range   B Natriuretic Peptide 33.4 0.0 - 100.0 pg/mL    Comment: Performed at [facility], [address] [place], [place] [address]  Lactic acid, plasma     Status: Abnormal   Collection Time: 11/16/20 11:48 AM  Result Value Ref Range   Lactic Acid, Venous 3.0 (HH) 0.5 - 1.9 mmol/L    Comment: CRITICAL RESULT CALLED TO, READ BACK BY AND VERIFIED WITH:  [place]ORDAN ROBINSON PA @1251  11/16/20 K. SANDERS Performed at [facility], [address] [place], [place] [address]   D-dimer, quantitative     Status: Abnormal   Collection Time: 11/16/20 11:48 AM  Result Value Ref Range   D-Dimer, Quant 1.98 (H) 0.00 - 0.50 ug/mL-FEU    Comment: (NOTE) At the manufacturer cut-off value of 0.5 g/mL FEU, this assay has a negative predictive value of 95-100%.This assay is intended for use in conjunction with a clinical pretest probability (PTP) assessment model to exclude pulmonary embolism (PE) and deep venous thrombosis (DVT) in outpatients suspected of PE or DVT. Results should be correlated with clinical presentation. Performed at [facility], [address] [place], [place] [address]   Troponin I (High Sensitivity)     Status: None   Collection Time: 11/16/20 11:48 AM  Result Value Ref Range   Troponin I (High Sensitivity) 8 <18 ng/L    Comment: (NOTE) Elevated high sensitivity troponin I (hsTnI) values and significant   changes across serial measurements may suggest ACS but many other  chronic and acute conditions are known to elevate hsTnI results.  Refer to the "Links" section for chest pain algorithms and additional  guidance. Performed at [facility],  [address], [place], [place] [address]   I-Stat venous blood gas, [facility])     Status: Abnormal   Collection Time: 11/16/20 11:58 AM  Result Value Ref Range   pH, Ven 7.423 7.250 - 7.430   pCO2, Ven 42.8 (L) 44.0 - 60.0 mmHg   pO2, Ven 30.0 (LL) 32.0 - 45.0 mmHg   Bicarbonate 27.9 20.0 - 28.0 mmol/L   TCO2 29 22 - 32 mmol/L   O2 Saturation 58.0 %   Acid-Base Excess 3.0 (H) 0.0 - 2.0 mmol/L   Sodium 146 (H) 135 - 145 mmol/L   Potassium 4.8 3.5 - 5.1 mmol/L   Calcium, Ion 1.16 1.15 - 1.40 mmol/L   HCT 35.0 (L) 36.0 - 46.0 %   Hemoglobin 11.9 (L) 12.0 - 15.0 g/dL   Sample type VENOUS    Comment NOTIFIED PHYSICIAN   Lactic acid, plasma     Status: None   Collection Time: 11/16/20  2:15 PM  Result Value Ref Range   Lactic Acid, Venous 1.6 0.5 - 1.9 mmol/L    Comment: Performed at [facility], [address] [place], [place] [address]    MICRO: reviewed IMAGING: DG Pelvis Portable  Result Date: 11/16/2020 CLINICAL DATA:  Pain following fall EXAM: PORTABLE PELVIS 1-2 VIEWS COMPARISON:  None. FINDINGS: There is no evidence of pelvic fracture or dislocation. Joint spaces appear normal. No erosive change. IMPRESSION: No fracture or dislocation.  No evident arthropathy. Electronically Signed   By: Bretta Bang III M.D.   On: 11/16/2020 10:30   DG Chest Port 1 View  Result Date: 11/16/2020 CLINICAL DATA:  Shortness of breath.  Recent fall EXAM: PORTABLE CHEST 1 VIEW COMPARISON:  October 18, 2018 FINDINGS: There is widespread airspace opacity throughout the lungs bilaterally without consolidation. Heart size and pulmonary vascularity are normal. No adenopathy. No pneumothorax. No bone lesions evident. IMPRESSION: Widespread  airspace opacity bilaterally, concerning for multifocal pneumonia, likely of atypical organism etiology. Differential considerations must include noncardiogenic pulmonary edema and widespread aspiration. Heart size normal. Electronically Signed   By: Bretta Bang III M.D.   On: 11/16/2020 10:29    HISTORICAL MICRO/IMAGING  Assessment/Plan:  50yo M with advanced poorly controlled hiv disease, CD 4 count <35/VL 67,600 (Jan 2022) on triumeq c/b CMV retinitis on valcyte proph who was admitted today for a fall and injury to her hip. Denies chest pain, SOB, productive cough nor any respiratory symptoms. CXR concerning for patchy bilateral airspace disease.  hiv disease= continue triumeq daily oi proph = continue with daily valcyte 450mg  (not treatment dosing) Thrush = will do fluconazole 400mg  daily  aki = will dose adjust her medication according  cxr concerning for viral vs. fungal pneumonia = vs. pcp pneumonia. Recommend to check covid PCR plus PJP stain. If covid testing is negative, recommend to empirically start on steroids plus iv bactrim. Will discontinue vancomycin and cefepime  Severe protein-caloric malnutrition/hiv wasting = continue to food supplementation.

## 2020-11-16 NOTE — ED Notes (Signed)
Patient tachy. Provider made aware by previous nurse. Will continue to monitor the patient at this time.

## 2020-11-16 NOTE — Progress Notes (Addendum)
PHARMACY NOTE:  ANTIMICROBIAL RENAL DOSAGE ADJUSTMENT  Current antimicrobial regimen includes a mismatch between antimicrobial dosage and estimated renal function.  As per policy approved by the Pharmacy & Therapeutics and Medical Executive Committees, the antimicrobial dosage will be adjusted accordingly.  Current antimicrobial dosage:  Bactrim 15 mg/kg IV q8h, valganciclovir 450 mg BID  Indication: suspected PCP Pneumonia  Renal Function:  Estimated Creatinine Clearance: 19.3 mL/min (A) (by C-G formula based on SCr of 1.89 mg/dL (H)).   []      On intermittent HD, scheduled: []      On CRRT    Antimicrobial dosage has been changed to:  Bactrim 5 mg/kg IV q12h, valganciclovir 450mg  2 times per week  Additional comments:   Thank you for allowing pharmacy to be a part of this patient's care.  , PharmD PGY-1 Pharmacy Resident 11/16/2020 7:51 PM Please see AMION for all pharmacy numbers

## 2020-11-16 NOTE — ED Provider Notes (Signed)
[facility] EMERGENCY DEPARTMENT Provider Note   CSN: [id number] Arrival date & time: 11/16/20  0865     History Chief Complaint  Patient presents with  . Fall    Krystal Bowen is a 50 y.o. female.  HPI   50 year old female past medical history of HIV, HTN, HLD, chronic anemia presents the emergency department after a fall which she attributes to weakness.  Patient states she was at home, trying to walk to the bathroom when she fell down back onto her bottom.  Did not hit her head, did not lose consciousness, no syncope.  Patient states she is been feeling very weak over the past couple days.  Report from EMS is that she was 60% on room air, has never needed supplemental oxygen before.  Patient admits to fatigue and cough but denies any acute symptoms like chest pain, abdominal pain, vomiting/diarrhea.  Past Medical History:  Diagnosis Date  . Anemia   . HIV infection (HCC)   . Hyperlipidemia   . Hypertension   . Neuropathy     Patient Active Problem List   Diagnosis Date Noted  . Insomnia 11/09/2020  . Unintentional weight loss 10/27/2020  . Unspecified severe protein-calorie malnutrition (HCC) 08/10/2020  . 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 Vulvar lesion 01/22/2019  . Noncompliance w/medication treatment due to intermit use of medication 10/18/2018  . Depression 01/01/2018  . Peripheral neuropathy 02/13/2017  . HIV disease (HCC) 02/12/2017  . HTN (hypertension) 02/12/2017  . Normocytic anemia 02/12/2017  . Dyslipidemia 02/12/2017  . History of shingles 02/12/2017    Past Surgical History:  Procedure Laterality Date  . HERNIA REPAIR       OB History   No obstetric history on file.     Family History  Problem Relation Age of Onset  . Heart attack Mother   . Stroke Mother   . Hyperlipidemia Mother   . Cancer Father        Leukemia   . Hyperlipidemia Maternal Grandmother   . Alzheimer's disease Maternal Grandmother   . Heart attack  Maternal Grandfather     Social History   Tobacco Use  . Smoking status: Never Smoker  . Smokeless tobacco: Never Used  Vaping Use  . Vaping Use: Never used  Substance Use Topics  . Alcohol use: Not Currently    Comment: social  . Drug use: No    Home Medications Prior to Admission medications   Medication Sig Start Date End Date Taking? Authorizing Provider  amitriptyline (ELAVIL) 25 MG tablet Take 1 tablet (25 mg total) by mouth at bedtime as needed for sleep. 11/09/20   [name], MD  erythromycin ophthalmic ointment Place into the right eye 2 times daily. Patient not taking: No sig reported 04/09/19   [provider]  gabapentin (NEURONTIN) 600 MG tablet Take 300 mg by mouth as needed (pain).  Patient not taking: Reported on 10/27/2020    [provider]  hydrochlorothiazide (HYDRODIURIL) 25 MG tablet TAKE 1 TABLET(25 MG) BY MOUTH DAILY 01/07/19   [name], MD  nystatin (MYCOSTATIN) 100000 UNIT/ML suspension Take 5 mLs (500,000 Units total) by mouth 4 (four) times daily. Patient not taking: Reported on 10/27/2020 04/22/20   [name], MD  prednisoLONE acetate (PRED FORTE) 1 % ophthalmic suspension Place 1 drop into the right eye every 2 hours. Patient not taking: No sig reported 04/09/19   [provider]  sulfamethoxazole-trimethoprim (BACTRIM) 200-40 MG/5ML suspension Take 20 mLs by mouth  daily. Patient not taking: Reported on 10/27/2020 06/08/20   [name], MD  TRIUMEQ 600-50-300 MG tablet TAKE 1 TABLET BY MOUTH DAILY 10/14/20   [name], MD  valGANciclovir (VALCYTE) 450 MG tablet Take 1 tablet (450 mg total) by mouth daily. 05/15/19   [name], MD    Allergies    Patient has no known allergies.  Review of Systems   Review of Systems  Constitutional: Positive for activity change, appetite change and fatigue. Negative for chills and fever.  HENT: Negative for congestion.   Eyes: Negative for visual disturbance.   Respiratory: Positive for cough. Negative for shortness of breath.   Cardiovascular: Negative for chest pain.  Gastrointestinal: Negative for abdominal pain, diarrhea and vomiting.  Genitourinary: Negative for dysuria.  Skin: Negative for rash.  Neurological: Negative for headaches.    Physical Exam Updated Vital Signs BP 130/82 (BP Location: Right Arm)   Pulse (!) 139   Temp 98.4 F (36.9 C) (Oral)   Resp (!) 30   SpO2 90%   Physical Exam Vitals and nursing note reviewed.  Constitutional:      Appearance: Normal appearance. She is ill-appearing.     Comments: Thin and cachectic appearing  HENT:     Head: Normocephalic.     Mouth/Throat:     Mouth: Mucous membranes are moist.  Cardiovascular:     Rate and Rhythm: Tachycardia present.  Pulmonary:     Effort: Pulmonary effort is normal. No respiratory distress.     Breath sounds: Wheezing present.     Comments: Nasal cannula in place at 5 L Abdominal:     Palpations: Abdomen is soft.     Tenderness: There is no abdominal tenderness.  Musculoskeletal:        General: No deformity.  Skin:    General: Skin is warm.  Neurological:     Mental Status: She is alert and oriented to person, place, and time. Mental status is at baseline.  Psychiatric:        Mood and Affect: Mood normal.     ED Results / Procedures / Treatments   Labs (all labs ordered are listed, but only abnormal results are displayed) Labs Reviewed  SARS CORONAVIRUS 2 BY RT PCR (HOSPITAL ORDER, PERFORMED IN [facility])  CBC WITH DIFFERENTIAL/PLATELET  COMPREHENSIVE METABOLIC PANEL  BLOOD GAS, VENOUS  BRAIN NATRIURETIC PEPTIDE  LACTIC ACID, PLASMA  LACTIC ACID, PLASMA  D-DIMER, QUANTITATIVE (NOT AT [facility])  TROPONIN I (HIGH SENSITIVITY)    EKG EKG Interpretation  Date/Time:  Tuesday November 16 2020 09:25:39 EST Ventricular Rate:  135 PR Interval:    QRS Duration: 100 QT Interval:  315 QTC Calculation: 471 R Axis:   96 Text  Interpretation: Ectopic atrial tachycardia, unifocal Probable lateral infarct, old Baseline wander in lead(s) V2 sinus tachycardia, uneven baseline Confirmed by Coralee Pesa [phone]) on 11/16/2020 9:32:47 AM   Radiology DG Pelvis Portable  Result Date: 11/16/2020 CLINICAL DATA:  Pain following fall EXAM: PORTABLE PELVIS 1-2 VIEWS COMPARISON:  None. FINDINGS: There is no evidence of pelvic fracture or dislocation. Joint spaces appear normal. No erosive change. IMPRESSION: No fracture or dislocation.  No evident arthropathy. Electronically Signed   By: Bretta Bang III M.D.   On: 11/16/2020 10:30   DG Chest Port 1 View  Result Date: 11/16/2020 CLINICAL DATA:  Shortness of breath.  Recent fall EXAM: PORTABLE CHEST 1 VIEW COMPARISON:  October 18, 2018 FINDINGS: There is widespread airspace opacity throughout the lungs  bilaterally without consolidation. Heart size and pulmonary vascularity are normal. No adenopathy. No pneumothorax. No bone lesions evident. IMPRESSION: Widespread airspace opacity bilaterally, concerning for multifocal pneumonia, likely of atypical organism etiology. Differential considerations must include noncardiogenic pulmonary edema and widespread aspiration. Heart size normal. Electronically Signed   By: Bretta Bang III M.D.   On: 11/16/2020 10:29    Procedures Procedures   Medications Ordered in ED Medications  sodium chloride 0.9 % bolus 1,000 mL (has no administration in time range)  dexamethasone (DECADRON) injection 10 mg (has no administration in time range)    ED Course  I have reviewed the triage vital signs and the nursing notes.  Pertinent labs & imaging results that were available during my care of the patient were reviewed by me and considered in my medical decision making (see chart for details).    MDM Rules/Calculators/A&P                          Patient arrives on 4 to 5 L nasal cannula, maintaining her airway and oxygenation.  She is alert  and oriented.  Blood work shows slightly worse than baseline kidney dysfunction, baseline anemia, cardiac work-up is negative.  Chest x-ray shows bilateral atypical pneumonia findings, concerning for possible Covid.  Covid swab is pending, however due to patient's new onset hypoxia she will require admission.  D-dimer is only 1.98, tachycardia resolved with hydration, lower suspicion for PE at this time but that information has been relayed to the admitting team and they will evaluate. Patient stable on 4 L [place] at time of admission.    Final Clinical Impression(s) / ED Diagnoses Final diagnoses:  None    Rx / DC Orders ED Discharge Orders    None       Rozelle Logan, DO 11/16/20 1510

## 2020-11-16 NOTE — ED Triage Notes (Signed)
Pt arrives via EMS from home with fall this morning due to weakness. Denies recent fever, some cough. No home O2. Rports some SOB, sats 60% RA on EMS arrival. Pt tachycardic. Alert, oriented x4, full code.

## 2020-11-16 NOTE — H&P (Addendum)
History and Physical    [name]:[id number] DOB: October 26, 1970 DOA: 11/16/2020  PCP: Hoyt Koch, MD (Inactive) Consultants:  Orvan Falconer - ID; Sherryll Burger - ophthalmology Patient coming from:  Home - lives with parents and her 50yo son; NOK: Mother, Eleanora Reutov, [phone], [phone]  Chief Complaint: Marletta Lor  HPI: Krystal Bowen is a 50 y.o. female with medical history significant of HTN; HLD; retinal detachment/CMV retinitis; severe depression; and HIV/AIDS presenting with a fall.  The patient reports that she is here for "advanced HIV".  She reports recent compliance with medications, although she misses several doses a week.  She has had recent cough and fatigue and fell last night, injuring her "butt bone."  +SOB but mild.  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    ED Course: Likely COVID.  New hypoxia, on 4L currently.  HIV, followed closely and long-standing non-compliance.  O2 in 60s with EMS.  CXR c/w COVID.  Elevated D-dimer, likely needs CTA but GFR low so needs hydration first (and maybe not needed if COVID +).  Lactate 3.0 -> 1.6.  Review of Systems: As per HPI; otherwise review of systems reviewed and negative.   Ambulatory Status:  Ambulates without assistance  COVID Vaccine Status:  Unknown  Past Medical History:  Diagnosis Date  . Anemia   . HIV infection (HCC)   . Hyperlipidemia   . Hypertension   . Neuropathy     Past Surgical History:  Procedure Laterality Date  . HERNIA REPAIR      Social History   Socioeconomic History  . Marital status: Legally Separated    Spouse name: Not on file  . Number of children: 4  . Years of education: Not on file  . Highest education level: Not on file  Occupational History  . 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 Smoking status: Never Smoker  . Smokeless tobacco: Never Used  Vaping Use  . Vaping Use: Never used  Substance and  Sexual Activity  . Alcohol use: Not Currently    Comment: social  . Drug use: No  . Sexual activity: Not Currently    Partners: Male    Birth control/protection: Condom    Comment: declined condoms 08/10/20  Other Topics Concern  . Not on file  Social History Narrative  . Not on file   Social Determinants of Health   Financial Resource Strain: Not on file  Food Insecurity: Not on file  Transportation Needs: Not on file  Physical Activity: Not on file  Stress: Not on file  Social Connections: Not on file  Intimate Partner Violence: Not on file    No Known Allergies  Family History  Problem Relation Age of Onset  . Heart attack Mother   . Stroke Mother   . Hyperlipidemia Mother   . Cancer Father        Leukemia   . Hyperlipidemia Maternal Grandmother   . Alzheimer's disease Maternal Grandmother   . Heart attack Maternal Grandfather     Prior to Admission medications   Medication Sig Start Date End Date Taking? Authorizing Provider  amitriptyline (ELAVIL) 25 MG tablet Take 1 tablet (25 mg total) by mouth at bedtime as needed for sleep. 11/09/20   [name], MD  erythromycin ophthalmic ointment Place into the right eye 2 times daily. Patient not taking: No sig reported 04/09/19   [provider]  gabapentin (NEURONTIN) 600 MG tablet Take 300 mg by mouth as needed (pain).  Patient not taking: Reported on 10/27/2020    [provider]  hydrochlorothiazide (HYDRODIURIL) 25 MG tablet TAKE 1 TABLET(25 MG) BY MOUTH DAILY 01/07/19   [name], MD  nystatin (MYCOSTATIN) 100000 UNIT/ML suspension Take 5 mLs (500,000 Units total) by mouth 4 (four) times daily. Patient not taking: Reported on 10/27/2020 04/22/20   [name], MD  prednisoLONE acetate (PRED FORTE) 1 % ophthalmic suspension Place 1 drop into the right eye every 2 hours. Patient not taking: No sig reported 04/09/19   [provider]  sulfamethoxazole-trimethoprim (BACTRIM) 200-40 MG/5ML  suspension Take 20 mLs by mouth daily. Patient not taking: Reported on 10/27/2020 06/08/20   [name], MD  TRIUMEQ 600-50-300 MG tablet TAKE 1 TABLET BY MOUTH DAILY 10/14/20   [name], MD  valGANciclovir (VALCYTE) 450 MG tablet Take 1 tablet (450 mg total) by mouth daily. 05/15/19   [name], MD    Physical Exam: Vitals:   11/16/20 1400 11/16/20 1411 11/16/20 1413 11/16/20 1415  BP: 96/83   100/82  Pulse:  (!) 49 (!) 44   Resp: (!) 37 (!) 40 (!) 37 (!) 38  Temp:      TempSrc:      SpO2:  96% 99%      . General:  Appears cachectic, chronically ill, frail . Eyes:  R eye is chronically impaired and appears to not react, L appears normal . ENT:  grossly normal hearing, lips; impressive oral thrush, dry MM . Neck:  no LAD, masses or thyromegaly . Cardiovascular:  RRR, no m/r/g. No LE edema.  Marland Kitchen Respiratory:   Scattered rhonchi.  Increased respiratory effort. . Abdomen:  soft, NT, ND, NABS . Skin:  no rash or induration seen on limited exam . Musculoskeletal: decreased tone BUE/BLE, good ROM, no bony abnormality . Psychiatric:  flat mood and affect, speech fluent and appropriate, AOx3 . Neurologic:  CN 2-12 grossly intact, moves all extremities in coordinated fashion    Radiological Exams on Admission: Independently reviewed - see discussion in A/P where applicable  DG Pelvis Portable  Result Date: 11/16/2020 CLINICAL DATA:  Pain following fall EXAM: PORTABLE PELVIS 1-2 VIEWS COMPARISON:  None. FINDINGS: There is no evidence of pelvic fracture or dislocation. Joint spaces appear normal. No erosive change. IMPRESSION: No fracture or dislocation.  No evident arthropathy. Electronically Signed   By: Krystal Bowen M.D.   On: 11/16/2020 10:30   DG Chest Port 1 View  Result Date: 11/16/2020 CLINICAL DATA:  Shortness of breath.  Recent fall EXAM: PORTABLE CHEST 1 VIEW COMPARISON:  October 18, 2018 FINDINGS: There is widespread airspace opacity throughout the lungs  bilaterally without consolidation. Heart size and pulmonary vascularity are normal. No adenopathy. No pneumothorax. No bone lesions evident. IMPRESSION: Widespread airspace opacity bilaterally, concerning for multifocal pneumonia, likely of atypical organism etiology. Differential considerations must include noncardiogenic pulmonary edema and widespread aspiration. Heart size normal. Electronically Signed   By: Krystal Bowen M.D.   On: 11/16/2020 10:29    EKG: Independently reviewed.  Tachycardia with rate 135; nonspecific ST changes with no evidence of acute ischemia   Labs on Admission: I have personally reviewed the available labs and imaging studies at the time of the admission.  Pertinent labs:   VBG: 7.423/42.8/30.0/27.9 Glucose 137 BUN 44/Creatinine 1.89/GFR 32; 21/1.33 on 1/5 Albumin 1.9 BNP 33.4 HS troponin 8, 8 Lactate 3.0 -> 1.6 WBC  9.2 Hgb 11.4 - stable D-dimer 1.98   Assessment/Plan Principal Problem:   Sepsis due to undetermined organism with acute respiratory failure (HCC) Active Problems:   HIV disease (HCC)   HTN (hypertension)   Dyslipidemia   Depression   Noncompliance w/medication treatment due to intermit use of medication   Thrush   CMV retinitis (HCC)   Unspecified severe protein-calorie malnutrition (HCC)    Sepsis in immunocompromised patient -SIRS criteria in this patient includes:  Tachycardia, tachypnea, hypoxia -Patient has evidence of acute organ failure with elevated lactate >2 that is not easily explained by another condition. -While awaiting blood cultures, this appears to be a preseptic condition. -Sepsis protocol initiated -Suspected source is PNA, but must consider fungal and PJP -Blood and urine cultures pending -Will admit due to: hemodynamic instability; hypoxemia; immunocompromise -Treat with IV Bactrim plus Solumedrol as per ID as well as PO fluconazole -Lactate has cleared -COVID negative -Will order lower respiratory  tract procalcitonin level.  Antibiotics would not be indicated for PCT <0.1 and probably should not be used for < 0.25.  >0.5 indicates infection and >>0.5 indicates more serious disease.  As the procalcitonin level normalizes, it will be reasonable to consider de-escalation of antibiotic coverage.  Advanced HIV with thrush -Patient has had significant long-term issues with noncompliance but recently appears to have been more consistently taking medications -Closely followed by Dr. Orvan Falconer -Continue Triumeq -Oral diflucan per ID -ID is consulting  AKI -Likely associated with severe infection, PO poor intake (possibly due to oropharyngeal thrush), continuation of HCTZ -Will rehydrate  -Hold HCTZ -Recheck BMP in AM  Fall -No apparent fracture -Likely related to dehydration/malnutrition -Supportive care  Malnutrition -Weight is 34 kg -The patient has at least 2 indicators for malnutrition (insufficient energy intake, weight loss, loss of muscle mass, loss of subcutaneous fat, diminished functional status).  -This is likely due to starvation related/chronic disease -Will obtain a nutrition consult for further recommendations.  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    DVT prophylaxis:  Lovenox Code Status:  DNR - confirmed with patient Family Communication: None present Disposition Plan:  The patient is from: home  Anticipated d/c is to: home, possibly with [facility] services  Anticipated d/c date will depend on clinical response to treatment, but likely several days  Patient is currently: acutely ill Consults called: None  Admission status: Admit - It is my clinical opinion that  admission to INPATIENT is reasonable and necessary because of the expectation that this patient will require hospital care that crosses at least 2 midnights to treat this condition based on the medical complexity of the problems presented.  Given the aforementioned information, the predictability of an adverse outcome is felt to be significant.    Jonah Blue MD [facility]   How to contact the [facility] Attending or Consulting provider 7A - 7P or covering provider during after hours 7P -7A, for this patient?  1. Check the care team in [facility] and look for a) attending/consulting TR[facility] provider listed and b) the [employer] team listed 2. Log into [email or web address] and use [facility] universal password to access. If you do not have the password, please contact the hospital  operator. 3. Locate the [facility] provider you are looking for under [facility] and page to a number that you can be directly reached. 4. If you still have difficulty reaching the provider, please page the [facility] (Director on Call) for the Hospitalists listed on amion for assistance.   11/16/2020, 6:42 PM

## 2020-11-17 DIAGNOSIS — J9600 Acute respiratory failure, unspecified whether with hypoxia or hypercapnia: Secondary | ICD-10-CM | POA: Diagnosis not present

## 2020-11-17 DIAGNOSIS — E43 Unspecified severe protein-calorie malnutrition: Secondary | ICD-10-CM | POA: Insufficient documentation

## 2020-11-17 DIAGNOSIS — A419 Sepsis, unspecified organism: Secondary | ICD-10-CM | POA: Diagnosis not present

## 2020-11-17 DIAGNOSIS — R6520 Severe sepsis without septic shock: Secondary | ICD-10-CM | POA: Diagnosis not present

## 2020-11-17 LAB — CBC
HCT: 27.8 % — ABNORMAL LOW (ref 36.0–46.0)
Hemoglobin: 8.5 g/dL — ABNORMAL LOW (ref 12.0–15.0)
MCH: 31.7 pg (ref 26.0–34.0)
MCHC: 30.6 g/dL (ref 30.0–36.0)
MCV: 103.7 fL — ABNORMAL HIGH (ref 80.0–100.0)
Platelets: UNDETERMINED 10*3/uL (ref 150–400)
RBC: 2.68 MIL/uL — ABNORMAL LOW (ref 3.87–5.11)
RDW: 17.1 % — ABNORMAL HIGH (ref 11.5–15.5)
WBC: 4.8 10*3/uL (ref 4.0–10.5)
nRBC: 0.4 % — ABNORMAL HIGH (ref 0.0–0.2)

## 2020-11-17 LAB — BASIC METABOLIC PANEL
Anion gap: 8 (ref 5–15)
Anion gap: 11 (ref 5–15)
BUN: 37 mg/dL — ABNORMAL HIGH (ref 6–20)
BUN: 39 mg/dL — ABNORMAL HIGH (ref 6–20)
CO2: 22 mmol/L (ref 22–32)
CO2: 20 mmol/L — ABNORMAL LOW (ref 22–32)
Calcium: 7.9 mg/dL — ABNORMAL LOW (ref 8.9–10.3)
Calcium: 8 mg/dL — ABNORMAL LOW (ref 8.9–10.3)
Chloride: 107 mmol/L (ref 98–111)
Chloride: 105 mmol/L (ref 98–111)
Creatinine, Ser: 1.34 mg/dL — ABNORMAL HIGH (ref 0.44–1.00)
Creatinine, Ser: 1.52 mg/dL — ABNORMAL HIGH (ref 0.44–1.00)
GFR, Estimated: 49 mL/min — ABNORMAL LOW (ref >60)
GFR, Estimated: 42 mL/min — ABNORMAL LOW (ref >60)
Glucose, Bld: 118 mg/dL — ABNORMAL HIGH (ref 70–99)
Glucose, Bld: 123 mg/dL — ABNORMAL HIGH (ref 70–99)
Potassium: 5.2 mmol/L — ABNORMAL HIGH (ref 3.5–5.1)
Potassium: 5.5 mmol/L — ABNORMAL HIGH (ref 3.5–5.1)
Sodium: 137 mmol/L (ref 135–145)
Sodium: 136 mmol/L (ref 135–145)

## 2020-11-17 LAB — PROCALCITONIN: Procalcitonin: 0.32 ng/mL

## 2020-11-17 LAB — TSH: TSH: 0.542 u[IU]/mL (ref 0.350–4.500)

## 2020-11-17 LAB — GLUCOSE, CAPILLARY: Glucose-Capillary: 137 mg/dL — ABNORMAL HIGH (ref 70–99)

## 2020-11-17 MED ORDER — IPRATROPIUM-ALBUTEROL 0.5-2.5 (3) MG/3ML IN SOLN
3.0000 mL | Freq: Four times a day (QID) | RESPIRATORY_TRACT | Status: DC
  Administered 2020-11-17 – 2020-11-18 (×7): 3 mL via RESPIRATORY_TRACT
  Filled 2020-11-17 (×7): qty 3

## 2020-11-17 MED ORDER — ADULT MULTIVITAMIN W/MINERALS CH
1.0000 | ORAL_TABLET | Freq: Every day | ORAL | Status: DC
  Administered 2020-11-17 – 2020-11-23 (×7): 1 via ORAL
  Filled 2020-11-17 (×6): qty 1

## 2020-11-17 MED ORDER — ENSURE ENLIVE PO LIQD
237.0000 mL | Freq: Three times a day (TID) | ORAL | Status: DC
  Administered 2020-11-17 – 2020-11-25 (×22): 237 mL via ORAL
  Filled 2020-11-17: qty 237

## 2020-11-17 MED ORDER — IPRATROPIUM-ALBUTEROL 0.5-2.5 (3) MG/3ML IN SOLN
3.0000 mL | RESPIRATORY_TRACT | Status: DC | PRN
  Administered 2020-11-19 – 2020-11-23 (×2): 3 mL via RESPIRATORY_TRACT
  Filled 2020-11-17 (×3): qty 3

## 2020-11-17 MED ORDER — DM-GUAIFENESIN ER 30-600 MG PO TB12
1.0000 | ORAL_TABLET | Freq: Two times a day (BID) | ORAL | Status: DC | PRN
  Administered 2020-11-18: 1 via ORAL
  Filled 2020-11-17: qty 1

## 2020-11-17 NOTE — Progress Notes (Signed)
Initial Nutrition Assessment  DOCUMENTATION CODES:   Underweight,Severe malnutrition in context of chronic illness  INTERVENTION:    Ensure Enlive po TID, each supplement provides 350 kcal and 20 grams of protein  Magic cup TID with meals, each supplement provides 290 kcal and 9 grams of protein  MVI daily   NUTRITION DIAGNOSIS:   Severe Malnutrition related to chronic illness (HIV/AIDS) as evidenced by severe fat depletion,severe muscle depletion.  GOAL:   Patient will meet greater than or equal to 90% of their needs  MONITOR:   PO intake,Supplement acceptance,Weight trends,Labs,I & O's  REASON FOR ASSESSMENT:   Consult Assessment of nutrition requirement/status  ASSESSMENT:   Patient with PMH significant for HTN, HLD, retinal detachment with CMV retinitis, severe depression, and HIV/AIDS. Presents this admission after a mechanical fall secondary to weakness. Found to have PNA.  Pt endorses a gradual loss in appetite over the last year. States during this time she consumed 1-2 smaller meal that her family member would prepare (meat, vegetable, grain) and have 2-3 Ensures. Denies issue with chewing or swallowing. Appetite this admission remains minimal but pt is going to try eating lunch. Discussed the importance of protein intake for preservation of lean body mass. Pt willing to continue Ensure this admission.   Pt endorses a UBW of 125 lb and a 30 lb weight loss over the last year. Suspect pt has lost significant weight but unable to quantify as admission weight looks to be stated.   Medications: solumedrol Labs: K 5.2 (H)   NUTRITION - FOCUSED PHYSICAL EXAM:  Flowsheet Row Most Recent Value  Orbital Region Severe depletion  Upper Arm Region Severe depletion  Thoracic and Lumbar Region Severe depletion  Buccal Region Severe depletion  Temple Region Severe depletion  Clavicle Bone Region Severe depletion  Clavicle and Acromion Bone Region Severe depletion   Scapular Bone Region Severe depletion  Dorsal Hand Severe depletion  Patellar Region Severe depletion  Anterior Thigh Region Severe depletion  Posterior Calf Region Severe depletion  Edema (RD Assessment) None  Hair Reviewed  Eyes Reviewed  Mouth Reviewed  Skin Reviewed  Nails Reviewed     Diet Order:   Diet Order            Diet regular Room service appropriate? Yes; Fluid consistency: Thin  Diet effective now                 EDUCATION NEEDS:   Education needs have been addressed  Skin:  Skin Assessment: Reviewed RN Assessment  Last BM:  PTA  Height:   Ht Readings from Last 1 Encounters:  11/16/20 5\' 1"  (1.549 m)    Weight:   Wt Readings from Last 1 Encounters:  11/16/20 34 kg    BMI:  Body mass index is 14.17 kg/m.  Estimated Nutritional Needs:   Kcal:  1200-1400 kcal  Protein:  65-85 grams  Fluid:  >/= 1.2 L/day  11/18/20 RD, LDN Clinical Nutrition Pager listed in AMION

## 2020-11-17 NOTE — Progress Notes (Signed)
PROGRESS NOTE    Krystal Bowen  MWU:[id number] DOB: 03-22-1971 DOA: 11/16/2020 PCP: Hoyt Koch, MD (Inactive)   Brief Narrative:  50 year old with history of HTN, HLD, retinal detachment with CMV retinitis, severe depression, HIV/AIDS admitted for fall secondary to weakness.  Admitted for acute hypoxia from multifocal pneumonia on 4 L nasal cannula.  Started on IV Bactrim and steroids.  She is also getting IV fluids for acute kidney injury.  Infectious disease following.   Assessment & Plan:   Principal Problem:   Sepsis due to undetermined organism with acute respiratory failure (HCC) Active Problems:   HIV disease (HCC)   HTN (hypertension)   Dyslipidemia   Depression   Noncompliance w/medication treatment due to intermit use of medication   Thrush   CMV retinitis (HCC)   Unspecified severe protein-calorie malnutrition (HCC)   Sepsis secondary to pneumonia, multifocal opacities -Sepsis physiology is improving follow-up culture data.  Blood cultures not sent on admission, I will go ahead and order it.  Patient is immunocompromised -COVID-19-negative -Procalcitonin-0.3, BNP-33 -On IV Bactrim and steroids -Bronchodilators, incentive spirometer, flutter valve ordered.  Out of bed to chair. -Infectious disease following  Advanced HIV with thrush  History of CMV retinitis -Fluconazole 40 mg daily -For HIV patient is onTriumeq,, continue prophylaxis Valcyte  Acute kidney injury -Baseline creatinine 1.1.  Admission creatinine 1.89.  Improving with hydration.  Anemia of chronic disease -Baseline hemoglobin around 11.  Slight drop in hemoglobin this morning to 8.5.  Suspect hemodilution with hydration.  No evidence of obvious bleeding.  Mechanical fall secondary to weakness and dehydration -No evidence of fracture.  Will get PT/OT  Depression -Home Elavil on hold which she takes as needed at bedtime  Essential hypertension -Home hydrochlorothiazide on hold due to AKI  getting hydration  Hyperlipidemia -Not on home meds  Adult failure to thrive with BMI less than 15 -Consult nutrition    DVT prophylaxis: heparin injection 5,000 Units Start: 11/16/20 2200  Code Status: DNR Family Communication:    Status is: Inpatient  Remains inpatient appropriate because:Inpatient level of care appropriate due to severity of illness   Dispo: The patient is from: Home              Anticipated d/c is to: Home              Anticipated d/c date is: 2 days              Patient currently is not medically stable to d/c.  Immunocompromise state getting IV antibiotics and steroids.  Maintain hospital stay until cleared by infectious disease   Difficult to place patient No       Body mass index is 14.17 kg/m.      Subjective: Feels okay no complaints  Review of Systems Otherwise negative except as per HPI, including: General: Denies fever, chills, night sweats or unintended weight loss. Resp: Denies cough, wheezing, shortness of breath. Cardiac: Denies chest pain, palpitations, orthopnea, paroxysmal nocturnal dyspnea. GI: Denies abdominal pain, nausea, vomiting, diarrhea or constipation GU: Denies dysuria, frequency, hesitancy or incontinence MS: Denies muscle aches, joint pain or swelling Neuro: Denies headache, neurologic deficits (focal weakness, numbness, tingling), abnormal gait Psych: Denies anxiety, depression, SI/HI/AVH Skin: Denies new rashes or lesions ID: Denies sick contacts, exotic exposures, travel  Examination:  General exam: Appears calm and comfortable, cachectic frail Respiratory system: Bilateral rhonchi Cardiovascular system: S1 & S2 heard, RRR. No JVD, murmurs, rubs, gallops or clicks. No pedal edema. Gastrointestinal system: Abdomen  is nondistended, soft and nontender. No organomegaly or masses felt. Normal bowel sounds heard. Central nervous system: Alert and oriented. No focal neurological deficits. Extremities: Symmetric 5  x 5 power. Skin: No rashes, lesions or ulcers Psychiatry: Judgement and insight appear normal. Mood & affect appropriate.     Objective: Vitals:   11/17/20 0415 11/17/20 0430 11/17/20 0600 11/17/20 0700  BP: 112/89 (!) 122/95 112/85 111/80  Pulse: 71 76 85 71  Resp: 16 (!) 21 (!) 31 16  Temp:      TempSrc:      SpO2: 100% 100% 99% 100%  Weight:      Height:       No intake or output data in the 24 hours ending 11/17/20 0856 Filed Weights   11/16/20 1900  Weight: 34 kg     Data Reviewed:   CBC: Recent Labs  Lab 11/16/20 1148 11/16/20 1158 11/17/20 0532  WBC 9.2  --  4.8  NEUTROABS 7.7  --   --   HGB 11.4* 11.9* 8.5*  HCT 38.6 35.0* 27.8*  MCV 103.8*  --  103.7*  PLT PLATELET CLUMPS NOTED ON SMEAR, UNABLE TO ESTIMATE  --  PLATELET CLUMPS NOTED ON SMEAR, UNABLE TO ESTIMATE   Basic Metabolic Panel: Recent Labs  Lab 11/16/20 1148 11/16/20 1158 11/17/20 0532  NA 143 146* 137  K 4.7 4.8 5.2*  CL 106  --  107  CO2 23  --  22  GLUCOSE 137*  --  118*  BUN 44*  --  37*  CREATININE 1.89*  --  1.34*  CALCIUM 8.5*  --  7.9*   GFR: Estimated Creatinine Clearance: 27.3 mL/min (A) (by C-G formula based on SCr of 1.34 mg/dL (H)). Liver Function Tests: Recent Labs  Lab 11/16/20 1148  AST 33  ALT 14  ALKPHOS 82  BILITOT 0.4  PROT 7.6  ALBUMIN 1.9*   No results for input(s): LIPASE, AMYLASE in the last 168 hours. No results for input(s): AMMONIA in the last 168 hours. Coagulation Profile: No results for input(s): INR, PROTIME in the last 168 hours. Cardiac Enzymes: No results for input(s): CKTOTAL, CKMB, CKMBINDEX, TROPONINI in the last 168 hours. BNP (last 3 results) No results for input(s): PROBNP in the last 8760 hours. HbA1C: No results for input(s): HGBA1C in the last 72 hours. CBG: No results for input(s): GLUCAP in the last 168 hours. Lipid Profile: No results for input(s): CHOL, HDL, LDLCALC, TRIG, CHOLHDL, LDLDIRECT in the last 72 hours. Thyroid  Function Tests: No results for input(s): TSH, T4TOTAL, FREET4, T3FREE, THYROIDAB in the last 72 hours. Anemia Panel: No results for input(s): VITAMINB12, FOLATE, FERRITIN, TIBC, IRON, RETICCTPCT in the last 72 hours. Sepsis Labs: Recent Labs  Lab 11/16/20 1148 11/16/20 1415 11/17/20 0532  PROCALCITON  --   --  0.32  LATICACIDVEN 3.0* 1.6  --     Recent Results (from the past 240 hour(s))  SARS Coronavirus 2 by RT PCR (hospital order, performed in [facility] hospital lab) Nasopharyngeal Nasopharyngeal Swab     Status: None   Collection Time: 11/16/20 10:06 AM   Specimen: Nasopharyngeal Swab  Result Value Ref Range Status   SARS Coronavirus 2 NEGATIVE NEGATIVE Final    Comment: (NOTE) SARS-CoV-2 target nucleic acids are NOT DETECTED.  The SARS-CoV-2 RNA is generally detectable in upper and lower respiratory specimens during the acute phase of infection. The lowest concentration of SARS-CoV-2 viral copies this assay can detect is 250 copies / mL. A negative  result does not preclude SARS-CoV-2 infection and should not be used as the sole basis for treatment or other patient management decisions.  A negative result may occur with improper specimen collection / handling, submission of specimen other than nasopharyngeal swab, presence of viral mutation(s) within the areas targeted by this assay, and inadequate number of viral copies (<250 copies / mL). A negative result must be combined with clinical observations, patient history, and epidemiological information.  Fact Sheet for Patients:   BoilerBrush.com.cy  Fact Sheet for Healthcare Providers: https://pope.com/  This test is not yet approved or  cleared by the [place] FDA and has been authorized for detection and/or diagnosis of SARS-CoV-2 by FDA under an Emergency Use Authorization (EUA).  This EUA will remain in effect (meaning this test can be used) for the duration of  the COVID-19 declaration under Section 564(b)(1) of the Act, 21 U.S.C. section 360bbb-3(b)(1), unless the authorization is terminated or revoked sooner.  Performed at [facility], [address] [place], [place] [address]          Radiology Studies: DG Pelvis Portable  Result Date: 11/16/2020 CLINICAL DATA:  Pain following fall EXAM: PORTABLE PELVIS 1-2 VIEWS COMPARISON:  None. FINDINGS: There is no evidence of pelvic fracture or dislocation. Joint spaces appear normal. No erosive change. IMPRESSION: No fracture or dislocation.  No evident arthropathy. Electronically Signed   By: Bretta Bang III M.D.   On: 11/16/2020 10:30   DG Chest Port 1 View  Result Date: 11/16/2020 CLINICAL DATA:  Shortness of breath.  Recent fall EXAM: PORTABLE CHEST 1 VIEW COMPARISON:  October 18, 2018 FINDINGS: There is widespread airspace opacity throughout the lungs bilaterally without consolidation. Heart size and pulmonary vascularity are normal. No adenopathy. No pneumothorax. No bone lesions evident. IMPRESSION: Widespread airspace opacity bilaterally, concerning for multifocal pneumonia, likely of atypical organism etiology. Differential considerations must include noncardiogenic pulmonary edema and widespread aspiration. Heart size normal. Electronically Signed   By: Bretta Bang III M.D.   On: 11/16/2020 10:29        Scheduled Meds: . abacavir-dolutegravir-lamiVUDine  1 tablet Oral Daily  . fluconazole  400 mg Oral Daily  . heparin injection (subcutaneous)  5,000 Units Subcutaneous Q8H  . methylPREDNISolone (SOLU-MEDROL) injection  60 mg Intravenous Q12H  . sodium chloride flush  3 mL Intravenous Q12H  . [START ON 11/18/2020] valGANciclovir  450 mg Oral Once per day on Mon Thu   Continuous Infusions: . lactated ringers 100 mL/hr at 11/17/20 0442  . sulfamethoxazole-trimethoprim 170.08 mg (11/17/20 0735)     LOS: 1 day   Time spent= 35 mins    Kilynn Fitzsimmons Joline Maxcy,  MD [facility]  If 7PM-7AM, please contact night-coverage  11/17/2020, 8:56 AM

## 2020-11-17 NOTE — Evaluation (Signed)
Physical Therapy Evaluation Patient Details Name: Krystal Bowen MRN: [id number] DOB: 30-May-1971 Today's Date: 11/17/2020   History of Present Illness  Krystal Bowen is a 50 y.o. female with medical history significant of HTN; HLD; retinal detachment/CMV retinitis; severe depression; and HIV/AIDS presenting with a fall.  Clinical Impression  Pt presents to PT with deficits in endurance, cardiopulmonary function, functional mobility, strength, power. Pt desaturates rapidly with minimal movement at this time. Pt is very frail and is unable to tolerate much activity due to impaired respiratory function and muscular endurance. PT attempts to encourage pursed lip breathing and provides education on the need for mindful breathing as pt expresses anxiety over mobilizing. Pt will benefit from continued acute PT services to improve mobility quality and to reduce falls risk. PT recommends SNF placement at this time as the pt has been falling at home despite assistance of her parents, and she is unable to tolerate enough activity to manage ADLs or mobilize at a household level.    Follow Up Recommendations SNF    Equipment Recommendations  Wheelchair (measurements PT);Wheelchair cushion (measurements PT);Hospital bed    Recommendations for Other Services       Precautions / Restrictions Precautions Precautions: Fall Precaution Comments: monitor SpO2 and RR, desats rapidly Restrictions Weight Bearing Restrictions: No      Mobility  Bed Mobility Overal bed mobility: Needs Assistance Bed Mobility: Rolling;Supine to Sit;Sit to Supine Rolling: Mod assist   Supine to sit: Mod assist Sit to supine: Max assist   General bed mobility comments: pt rolls to left, very anxious with supine to sit    Transfers                 General transfer comment: deferred 2/2 vitals  Ambulation/Gait                Stairs            Wheelchair Mobility    Modified Rankin (Stroke  Patients Only)       Balance Overall balance assessment: Needs assistance Sitting-balance support: Single extremity supported;Bilateral upper extremity supported;Feet unsupported Sitting balance-Leahy Scale: Poor Sitting balance - Comments: reliant on minG and BUE support of bed                                     Pertinent Vitals/Pain Pain Assessment: Faces Faces Pain Scale: Hurts little more Pain Location: generalized with mobility Pain Descriptors / Indicators: Grimacing Pain Intervention(s): Monitored during session    Home Living Family/patient expects to be discharged to:: Private residence Living Arrangements: Parent;Children Available Help at Discharge: Available PRN/intermittently Type of Home: House Home Access: Stairs to enter Entrance Stairs-Rails: None Entrance Stairs-Number of Steps: 2 Home Layout: One level Home Equipment: Walker - 2 wheels      Prior Function Level of Independence: Independent   Gait / Transfers Assistance Needed: pt reports independence with mobility to PT, reports assistance needed for all mobility and transfers from parents. Will need to more accurately determine baseline next session  ADL's / Homemaking Assistance Needed: Assisted for all ADL; iADL provided for her        Hand Dominance   Dominant Hand: Right    Extremity/Trunk Assessment   Upper Extremity Assessment Upper Extremity Assessment: Generalized weakness RUE Deficits / Details: 3-/5 MM grade; fair grip LUE Deficits / Details: 3-/5 MM grade; fair grip    Lower Extremity Assessment  Lower Extremity Assessment: Generalized weakness    Cervical / Trunk Assessment Cervical / Trunk Assessment: Other exceptions (frail)  Communication   Communication: No difficulties  Cognition Arousal/Alertness: Awake/alert Behavior During Therapy: Anxious Overall Cognitive Status: Within Functional Limits for tasks assessed                                  General Comments: Pt tearful about situation. Pt reporting to require assist with ADL and mobility from family; pt did not specifiy for how long..      General Comments General comments (skin integrity, edema, etc.): pt on 3L [place] upon entry, sats in low 90s and RR in mid to high 30s at rest. With supine to sit pt sats drop rapidly to 65% while on 3L [place]. Pt returned to supine. PT increases supplemental oxygen to 6L [place], pt with gradual improvement of sats to mid 90s. Pt RR persists in mid 40s, PT provides education on pursed lip breathing, gradual decrease in RR to high 30s.    Exercises     Assessment/Plan    PT Assessment Patient needs continued PT services  PT Problem List Decreased strength;Decreased activity tolerance;Decreased balance;Decreased mobility;Decreased knowledge of use of DME;Decreased safety awareness;Decreased knowledge of precautions;Cardiopulmonary status limiting activity       PT Treatment Interventions DME instruction;Gait training;Functional mobility training;Therapeutic activities;Therapeutic exercise;Balance training;Neuromuscular re-education;Patient/family education;Wheelchair mobility training    PT Goals (Current goals can be found in the Care Plan section)  Acute Rehab PT Goals Patient Stated Goal: to improve strength and activity tolerance PT Goal Formulation: With patient Time For Goal Achievement: 12/01/20 Potential to Achieve Goals: Fair    Frequency Min 2X/week   Barriers to discharge        Co-evaluation               AM-PAC PT "6 Clicks" Mobility  Outcome Measure Help needed turning from your back to your side while in a flat bed without using bedrails?: A Lot Help needed moving from lying on your back to sitting on the side of a flat bed without using bedrails?: A Lot Help needed moving to and from a bed to a chair (including a wheelchair)?: A Lot Help needed standing up from a chair using your arms (e.g., wheelchair or bedside  chair)?: A Lot Help needed to walk in hospital room?: Total Help needed climbing 3-5 steps with a railing? : Total 6 Click Score: 10    End of Session Equipment Utilized During Treatment: Oxygen Activity Tolerance: Treatment limited secondary to medical complications (Comment) (desaturation, increased RR) Patient left: in bed;with call bell/phone within reach;with bed alarm set Nurse Communication: Mobility status PT Visit Diagnosis: Other abnormalities of gait and mobility (R26.89);Muscle weakness (generalized) (M62.81)    Time: 4818-5631 PT Time Calculation (min) (ACUTE ONLY): 15 min   Charges:   PT Evaluation $PT Eval Moderate Complexity: 1 Mod          Arlyss Gandy, PT, DPT Acute Rehabilitation Pager: [phone]   Arlyss Gandy 11/17/2020, 5:36 PM

## 2020-11-17 NOTE — Progress Notes (Signed)
[facility]    Date of Admission:  11/16/2020   Total days of antibiotics 2   ID: Krystal Bowen is a 50 y.o. female with  Principal Problem:   Sepsis due to undetermined organism with acute respiratory failure (HCC) Active Problems:   HIV disease (HCC)   HTN (hypertension)   Dyslipidemia   Depression   Noncompliance w/medication treatment due to intermit use of medication   Thrush   CMV retinitis (HCC)   Unspecified severe protein-calorie malnutrition (HCC)    Subjective: Still having some hip pain from fall.   Bilateral patchy infiltrates on cxr per my read  Medications:  . abacavir-dolutegravir-lamiVUDine  1 tablet Oral Daily  . fluconazole  400 mg Oral Daily  . heparin injection (subcutaneous)  5,000 Units Subcutaneous Q8H  . ipratropium-albuterol  3 mL Nebulization Q6H  . methylPREDNISolone (SOLU-MEDROL) injection  60 mg Intravenous Q12H  . sodium chloride flush  3 mL Intravenous Q12H  . [START ON 11/18/2020] valGANciclovir  450 mg Oral Once per day on Mon Thu    Objective: Vital signs in last 24 hours: Temp:  [97.9 F (36.6 C)-98 F (36.7 C)] 98 F (36.7 C) (01/26 1146) Pulse Rate:  [30-123] 98 (01/26 1156) Resp:  [16-46] 24 (01/26 1156) BP: (91-124)/(75-97) 124/96 (01/26 1146) SpO2:  [89 %-100 %] 100 % (01/26 1146) Weight:  [34 kg] 34 kg (01/25 1900) Physical Exam  Constitutional:  oriented to person, place, and time. appears cachetic and severely mal-  nourished. No distress.  HENT: [place]/AT, PERRLA, no scleral icterus Mouth/Throat: Oropharynx is clear and moist. No oropharyngeal exudate.  Cardiovascular: Normal rate, regular rhythm and normal heart sounds. Exam reveals no gallop and no friction rub.  No murmur heard.  Pulmonary/Chest: Effort normal and breath sounds normal. No respiratory distress.  has no wheezes.  Neck = supple, no nuchal rigidity Abdominal: Soft. Bowel sounds are normal.  exhibits no distension. There is no  tenderness.  Lymphadenopathy: no cervical adenopathy. No axillary adenopathy Neurological: alert and oriented to person, place, and time.  Skin: Skin is warm and dry. No rash noted. No erythema.  Psychiatric: a normal mood and affect.  behavior is normal.    Lab Results Recent Labs    11/16/20 1148 11/16/20 1158 11/17/20 0532  WBC 9.2  --  4.8  HGB 11.4* 11.9* 8.5*  HCT 38.6 35.0* 27.8*  NA 143 146* 137  K 4.7 4.8 5.2*  CL 106  --  107  CO2 23  --  22  BUN 44*  --  37*  CREATININE 1.89*  --  1.34*   Liver Panel Recent Labs    11/16/20 1148  PROT 7.6  ALBUMIN 1.9*  AST 33  ALT 14  ALKPHOS 82  BILITOT 0.4   Sedimentation Rate No results for input(s): ESRSEDRATE in the last 72 hours. C-Reactive Protein No results for input(s): CRP in the last 72 hours.  Microbiology:  Studies/Results: DG Pelvis Portable  Result Date: 11/16/2020 CLINICAL DATA:  Pain following fall EXAM: PORTABLE PELVIS 1-2 VIEWS COMPARISON:  None. FINDINGS: There is no evidence of pelvic fracture or dislocation. Joint spaces appear normal. No erosive change. IMPRESSION: No fracture or dislocation.  No evident arthropathy. Electronically Signed   By: Bretta Bang III M.D.   On: 11/16/2020 10:30   DG Chest Port 1 View  Result Date: 11/16/2020 CLINICAL DATA:  Shortness of breath.  Recent fall EXAM: PORTABLE CHEST 1 VIEW COMPARISON:  October 18, 2018 FINDINGS: There is widespread airspace opacity throughout the lungs bilaterally without consolidation. Heart size and pulmonary vascularity are normal. No adenopathy. No pneumothorax. No bone lesions evident. IMPRESSION: Widespread airspace opacity bilaterally, concerning for multifocal pneumonia, likely of atypical organism etiology. Differential considerations must include noncardiogenic pulmonary edema and widespread aspiration. Heart size normal. Electronically Signed   By: Bretta Bang III M.D.   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 Please get sputum tested for pjp stain  aki = improving with fluid rehydration. Continue to dose adjust bactrim. If aki worsens, may need to change to alternative therapy for pjp  hiv disease= continue on triumeq  Thrush = continue on fluconazole  Severe protein calorie malnutrition = agree with nutritionist evaluation   [facility] Cell: [phone] Pager: [phone]  11/17/2020, 1:08 PM

## 2020-11-17 NOTE — Evaluation (Signed)
Occupational Therapy Evaluation Patient Details Name: Krystal Bowen MRN: [id number] DOB: [birth year] Today's Date: 11/17/2020    History of Present Illness Krystal Bowen is a 50 y.o. female with medical history significant of HTN; HLD; retinal detachment/CMV retinitis; severe depression; and HIV/AIDS presenting with a fall.   Clinical Impression   Pt PTA: pt requiring assist from family for ADL and mobility with RW, but recently pt had fallen even with the assist from her mother. Pt reporting this and getting tearful of situation. Pt currently, limited to bed mobility today due to pt fearful of falling and needing to use bedpan for possible BM. Pt education on use of BSC instead, but pt preferred to use bed pan. Pt modA overall for bed mobility, but able to bridge at hips for bed pan to be applied. Pt would benefit from continued OT skilled services for ADL, mobility and safety in SNF setting. OT following acutely.  * hope to progress to home with [facility] based on progress, but due to recent falls, pt might require a higher level of care.    Follow Up Recommendations  SNF    Equipment Recommendations  None recommended by OT;Wheelchair (measurements OT);Wheelchair cushion (measurements OT);3 in 1 bedside commode    Recommendations for Other Services PT consult     Precautions / Restrictions Precautions Precautions: Fall Restrictions Weight Bearing Restrictions: No      Mobility Bed Mobility Overal bed mobility: Needs Assistance Bed Mobility: Rolling Rolling: Mod assist         General bed mobility comments: Pt rolling to L and bridging up in order for bed pan to be placed    Transfers                 General transfer comment: declined to test    Balance                                           ADL either performed or assessed with clinical judgement   ADL Overall ADL's : Needs assistance/impaired Eating/Feeding: Set up;Bed level   Grooming:  Set up;Bed level   Upper Body Bathing: Moderate assistance;Bed level   Lower Body Bathing: Maximal assistance;Bed level   Upper Body Dressing : Moderate assistance;Bed level   Lower Body Dressing: Maximal assistance;Bed level   Toilet Transfer: Total assistance Toilet Transfer Details (indicate cue type and reason): unable to test; use of bed pan Toileting- Clothing Manipulation and Hygiene: Total assistance;+2 for safety/equipment;+2 for physical assistance       Functional mobility during ADLs: Moderate assistance (bed mobility) General ADL Comments: Pt very low energy, decreased strength, decreased ability to care for self and decreased activity tolerance to perform any more than bed mobility to get onto bed pan. Pt not willing to try a transfer to [facility] at this time evem with education. Pt reports fearful of falling.     Vision Baseline Vision/History: Wears glasses Wears Glasses: At all times Patient Visual Report: No change from baseline Vision Assessment?: No apparent visual deficits     Perception     Praxis      Pertinent Vitals/Pain Pain Assessment: Faces Faces Pain Scale: Hurts even more Pain Location: bottom, hip and lips Pain Descriptors / Indicators: Grimacing;Discomfort     Hand Dominance Right   Extremity/Trunk Assessment Upper Extremity Assessment Upper Extremity Assessment: Generalized weakness;RUE deficits/detail;LUE deficits/detail RUE Deficits /  Details: 3-/5 MM grade; fair grip LUE Deficits / Details: 3-/5 MM grade; fair grip   Lower Extremity Assessment Lower Extremity Assessment: Defer to PT evaluation       Communication Communication Communication: No difficulties   Cognition Arousal/Alertness: Awake/alert Behavior During Therapy: Anxious Overall Cognitive Status: No family/caregiver present to determine baseline cognitive functioning                                 General Comments: Pt tearful about situation. Pt  reporting to require assist with ADL and mobility from family; pt did not specifiy for how long..   General Comments       Exercises     Shoulder Instructions      Home Living Family/patient expects to be discharged to:: Private residence Living Arrangements: Parent;Children Available Help at Discharge: Available 24 hours/day Type of Home: House Home Access: Stairs to enter [employer] of Steps: 1   Home Layout: One level     Bathroom Shower/Tub: Producer, television/film/video: Standard     Home Equipment: Environmental consultant - 2 wheels          Prior Functioning/Environment Level of Independence: Needs assistance  Gait / Transfers Assistance Needed: assisted with all mobility and transfers from parents; RW for mobility ADL's / Homemaking Assistance Needed: Assisted for all ADL; iADL provided for her            OT Problem List: Decreased strength;Decreased activity tolerance;Impaired balance (sitting and/or standing);Decreased cognition;Decreased safety awareness;Pain;Increased edema;Cardiopulmonary status limiting activity      OT Treatment/Interventions: Self-care/ADL training;Therapeutic exercise;Energy conservation;DME and/or AE instruction;Therapeutic activities;Cognitive remediation/compensation;Patient/family education;Balance training    OT Goals(Current goals can be found in the care plan section) Acute Rehab OT Goals Patient Stated Goal: to go home OT Goal Formulation: With patient Time For Goal Achievement: 12/01/20 Potential to Achieve Goals: Good ADL Goals Pt Will Perform Grooming: with set-up;sitting Pt Will Perform Upper Body Bathing: with min guard assist;sitting Pt Will Transfer to Toilet: with min assist;stand pivot transfer;bedside commode Pt Will Perform Toileting - Clothing Manipulation and hygiene: with min assist;sitting/lateral leans;sit to/from stand Additional ADL Goal #1: Pt will perform x10 mins for ADL tasks for with 2 rest breaks to  increase activity tolerance. Additional ADL Goal #2: Pt will state 3 fall stratgies to utilize at present and at home.  OT Frequency: Min 2X/week   Barriers to D/C: Decreased caregiver support  pt has assist from her parents, but lately, pt has required more physical assist and her family may not be able to withstand the care required       Co-evaluation              AM-PAC OT "6 Clicks" Daily Activity     Outcome Measure Help from another person eating meals?: A Little Help from another person taking care of personal grooming?: A Little Help from another person toileting, which includes using toliet, bedpan, or urinal?: A Lot Help from another person bathing (including washing, rinsing, drying)?: A Lot Help from another person to put on and taking off regular upper body clothing?: A Lot Help from another person to put on and taking off regular lower body clothing?: A Lot 6 Click Score: 14   End of Session Nurse Communication: Mobility status;Other (comment) (pt left in bedpan)  Activity Tolerance: Patient limited by pain;Patient limited by lethargy Patient left: in bed;with call bell/phone within reach;Other (comment) (  bed pan)  OT Visit Diagnosis: Unsteadiness on feet (R26.81);Muscle weakness (generalized) (M62.81);Pain                Time: 1520-1545 OT Time Calculation (min): 25 min Charges:  OT General Charges $OT Visit: 1 Visit OT Evaluation $OT Eval Moderate Complexity: 1 Mod OT Treatments $Self Care/Home Management : 8-22 mins  Flora Lipps, OTR/L Acute Rehabilitation Services Pager: [phone] Office: [phone]   Lark Runk C 11/17/2020, 5:05 PM

## 2020-11-18 ENCOUNTER — Ambulatory Visit: Payer: PRIVATE HEALTH INSURANCE

## 2020-11-18 DIAGNOSIS — J9600 Acute respiratory failure, unspecified whether with hypoxia or hypercapnia: Secondary | ICD-10-CM | POA: Diagnosis not present

## 2020-11-18 DIAGNOSIS — A419 Sepsis, unspecified organism: Secondary | ICD-10-CM | POA: Diagnosis not present

## 2020-11-18 DIAGNOSIS — R6520 Severe sepsis without septic shock: Secondary | ICD-10-CM | POA: Diagnosis not present

## 2020-11-18 LAB — BASIC METABOLIC PANEL
Anion gap: 7 (ref 5–15)
BUN: 35 mg/dL — ABNORMAL HIGH (ref 6–20)
CO2: 23 mmol/L (ref 22–32)
Calcium: 8 mg/dL — ABNORMAL LOW (ref 8.9–10.3)
Chloride: 104 mmol/L (ref 98–111)
Creatinine, Ser: 1.47 mg/dL — ABNORMAL HIGH (ref 0.44–1.00)
GFR, Estimated: 43 mL/min — ABNORMAL LOW (ref >60)
Glucose, Bld: 135 mg/dL — ABNORMAL HIGH (ref 70–99)
Potassium: 4.7 mmol/L (ref 3.5–5.1)
Sodium: 134 mmol/L — ABNORMAL LOW (ref 135–145)

## 2020-11-18 LAB — CBC
HCT: 27.3 % — ABNORMAL LOW (ref 36.0–46.0)
Hemoglobin: 8.5 g/dL — ABNORMAL LOW (ref 12.0–15.0)
MCH: 31.4 pg (ref 26.0–34.0)
MCHC: 31.1 g/dL (ref 30.0–36.0)
MCV: 100.7 fL — ABNORMAL HIGH (ref 80.0–100.0)
Platelets: UNDETERMINED 10*3/uL (ref 150–400)
RBC: 2.71 MIL/uL — ABNORMAL LOW (ref 3.87–5.11)
RDW: 16.7 % — ABNORMAL HIGH (ref 11.5–15.5)
WBC: 10 10*3/uL (ref 4.0–10.5)
nRBC: 0.2 % (ref 0.0–0.2)

## 2020-11-18 LAB — MAGNESIUM: Magnesium: 1.8 mg/dL (ref 1.7–2.4)

## 2020-11-18 MED ORDER — FLUCONAZOLE 100 MG PO TABS
200.0000 mg | ORAL_TABLET | Freq: Every day | ORAL | Status: DC
  Administered 2020-11-18 – 2020-11-25 (×8): 200 mg via ORAL
  Filled 2020-11-18 (×10): qty 2

## 2020-11-18 MED ORDER — AMITRIPTYLINE HCL 50 MG PO TABS
25.0000 mg | ORAL_TABLET | Freq: Every evening | ORAL | Status: DC | PRN
  Administered 2020-11-19: 25 mg via ORAL
  Filled 2020-11-18: qty 1

## 2020-11-18 MED ORDER — SODIUM ZIRCONIUM CYCLOSILICATE 10 G PO PACK
10.0000 g | PACK | Freq: Once | ORAL | Status: AC
  Administered 2020-11-18: 10 g via ORAL
  Filled 2020-11-18: qty 1

## 2020-11-18 NOTE — [place] FL2 (Signed)
[place] MEDICAID FL2 LEVEL OF CARE SCREENING TOOL     IDENTIFICATION  Patient Name: Krystal Bowen Birthdate: 21-Dec-1970 Sex: female Admission Date (Current Location): 11/16/2020  [place] and [place] Number:  Haynes Bast [id number] K Facility and Address:  [facility] [place] [facility], [address] [place], [place] [address]      Provider Number: [id number]  Attending Physician Name and Address:  Salomon Mast*  Relative Name and Phone Number:  Summer, Mccolgan Mother [phone]    Current Level of Care: Hospital Recommended Level of Care: Skilled Nursing Facility Prior Approval Number:    Date Approved/Denied:   PASRR Number:    Discharge Plan: SNF    Current Diagnoses: Patient Active Problem List   Diagnosis Date Noted  . Protein-calorie malnutrition, severe 11/17/2020  . Sepsis due to undetermined organism with acute respiratory failure (HCC) 11/16/2020  . Insomnia 11/09/2020  . Unintentional weight loss 10/27/2020  . Unspecified severe protein-calorie malnutrition (HCC) 08/10/2020  . 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 Vulvar lesion 01/22/2019  . Thrush 01/09/2019  . Noncompliance w/medication treatment due to intermit use of medication 10/18/2018  . Depression 01/01/2018  . Peripheral neuropathy 02/13/2017  . HIV disease (HCC) 02/12/2017  . HTN (hypertension) 02/12/2017  . Normocytic anemia 02/12/2017  . Dyslipidemia 02/12/2017  . History of shingles 02/12/2017    Orientation RESPIRATION BLADDER Height & Weight     Self,Time,Situation,Place  O2 Continent Weight: 75 lb (34 kg) Height:  5\' 1"  (154.9 cm)  BEHAVIORAL SYMPTOMS/MOOD NEUROLOGICAL BOWEL NUTRITION STATUS      Continent Diet (regular diet.  See discharge summary)  AMBULATORY STATUS COMMUNICATION OF NEEDS Skin   Total Care Verbally Normal                       Personal Care Assistance Level of Assistance  Bathing,Feeding,Dressing Bathing Assistance: Maximum  assistance Feeding assistance: Independent Dressing Assistance: Maximum assistance     Functional Limitations Info  Sight,Hearing,Speech Sight Info: Adequate Hearing Info: Adequate Speech Info: Adequate    SPECIAL CARE FACTORS FREQUENCY  PT (By licensed PT),OT (By licensed OT)     PT Frequency: 5x week OT Frequency: 5x week            Contractures Contractures Info: Not present    Additional Factors Info  Code Status,Allergies Code Status Info: DNR Allergies Info: NKA           Current Medications (11/18/2020):  This is the current hospital active medication list Current Facility-Administered Medications  Medication Dose Route Frequency Provider Last Rate Last Admin  . abacavir-dolutegravir-lamiVUDine (TRIUMEQ) 600-50-300 MG per tablet 1 tablet  1 tablet Oral Daily 11/20/2020, MD   1 tablet at 11/18/20 0919  . acetaminophen (TYLENOL) tablet 650 mg  650 mg Oral Q6H PRN 11/20/20, MD   650 mg at 11/17/20 1801   Or  . acetaminophen (TYLENOL) suppository 650 mg  650 mg Rectal Q6H PRN 11/19/20, MD      . dextromethorphan-guaiFENesin [facility] DM) 30-600 MG per 12 hr tablet 1 tablet  1 tablet Oral BID PRN [facility], MD   1 tablet at 11/18/20 0919  . feeding supplement (ENSURE ENLIVE / ENSURE PLUS) liquid 237 mL  237 mL Oral TID BM Amin, Ankit Chirag, MD   237 mL at 11/18/20 1223  . fluconazole (DIFLUCAN) tablet 200 mg  200 mg Oral Daily 11/20/20, MD   200 mg at 11/18/20 0919  . heparin injection  5,000 Units  5,000 Units Subcutaneous Bobette Mo, MD   5,000 Units at 11/18/20 0527  . ipratropium-albuterol (DUONEB) 0.5-2.5 (3) MG/3ML nebulizer solution 3 mL  3 mL Nebulization Q4H PRN Amin, Ankit Chirag, MD      . ipratropium-albuterol (DUONEB) 0.5-2.5 (3) MG/3ML nebulizer solution 3 mL  3 mL Nebulization Q6H Amin, Ankit Chirag, MD   3 mL at 11/18/20 0727  . ketorolac (TORADOL) 15 MG/ML injection 15 mg  15 mg Intravenous Q6H PRN Jonah Blue, MD      . lactated ringers infusion   Intravenous Continuous Jonah Blue, MD 100 mL/hr at 11/18/20 1223 New Bag at 11/18/20 1223  . methylPREDNISolone sodium succinate (SOLU-MEDROL) 125 mg/2 mL injection 60 mg  60 mg Intravenous Steva Colder, MD   60 mg at 11/18/20 0263  . multivitamin with minerals tablet 1 tablet  1 tablet Oral Daily Dimple Nanas, MD   1 tablet at 11/18/20 0919  . ondansetron (ZOFRAN) tablet 4 mg  4 mg Oral Q6H PRN Jonah Blue, MD       Or  . ondansetron [facility]) injection 4 mg  4 mg Intravenous Q6H PRN Jonah Blue, MD      . oxyCODONE (Oxy IR/ROXICODONE) immediate release tablet 5 mg  5 mg Oral Q4H PRN Jonah Blue, MD   5 mg at 11/16/20 2028  . sodium chloride flush (NS) 0.9 % injection 3 mL  3 mL Intravenous Q12H Jonah Blue, MD   3 mL at 11/18/20 0919  . sulfamethoxazole-trimethoprim (BACTRIM) 170.08 mg in dextrose 5 % 250 mL IVPB  5 mg/kg Intravenous Q12H Judyann Munson, MD 260.6 mL/hr at 11/18/20 0928 170.08 mg at 11/18/20 0928  . valGANciclovir (VALCYTE) 450 MG tablet TABS 450 mg  450 mg Oral Once per day on Mon Thu Yates, Jennifer, MD         Discharge Medications: Please see discharge summary for a list of discharge medications.  Relevant Imaging Results:  Relevant Lab Results:   Additional Information SSN: [id number]  Lorri Frederick, LCSW

## 2020-11-18 NOTE — Progress Notes (Signed)
[facility]    Date of Admission:  11/16/2020   Total days of antibiotics 3   ID: Krystal Bowen is a 50 y.o. female with  Principal Problem:   Sepsis due to undetermined organism with acute respiratory failure (HCC) Active Problems:   HIV disease (HCC)   HTN (hypertension)   Dyslipidemia   Depression   Noncompliance w/medication treatment due to intermit use of medication   Thrush   CMV retinitis (HCC)   Unspecified severe protein-calorie malnutrition (HCC)   Protein-calorie malnutrition, severe    Subjective: Feeling so-so. She remains afebrile, having good oral intake, sleepy this afternoon. Denies shortness of breath but has not ambulated  ROS: 12 point ros is negative, except for fatigue  Medications:  . abacavir-dolutegravir-lamiVUDine  1 tablet Oral Daily  . feeding supplement  237 mL Oral TID BM  . fluconazole  200 mg Oral Daily  . heparin injection (subcutaneous)  5,000 Units Subcutaneous Q8H  . ipratropium-albuterol  3 mL Nebulization Q6H  . methylPREDNISolone (SOLU-MEDROL) injection  60 mg Intravenous Q12H  . multivitamin with minerals  1 tablet Oral Daily  . sodium chloride flush  3 mL Intravenous Q12H  . valGANciclovir  450 mg Oral Once per day on Mon Thu    Objective: Vital signs in last 24 hours: Temp:  [97.8 F (36.6 C)-98.3 F (36.8 C)] 98 F (36.7 C) (01/27 1724) Pulse Rate:  [77-111] 94 (01/27 1724) Resp:  [18-38] 35 (01/27 1724) BP: (109-128)/(89-101) 128/99 (01/27 1724) SpO2:  [93 %-100 %] 96 % (01/27 1724) Physical Exam  Constitutional:  oriented to person, place, and time. Appears frail and severely undernourised. No distress.  HENT: [place]/AT, PERRLA, no scleral icterus Mouth/Throat: Oropharynx is clear and moist. No oropharyngeal exudate. Thrush improved Cardiovascular: Normal rate, regular rhythm and normal heart sounds. Exam reveals no gallop and no friction rub.  No murmur heard.  Pulmonary/Chest: Effort  normal and breath sounds mild rhonchi. No respiratory distress.  has no wheezes.  Neck = supple, no nuchal rigidity Abdominal: Soft. Bowel sounds are normal.  exhibits no distension. There is no tenderness.  Lymphadenopathy: no cervical adenopathy. No axillary adenopathy Neurological: alert and oriented to person, place, and time.  Skin: Skin is warm and dry. No rash noted. No erythema.  Psychiatric: a normal mood and affect.  behavior is normal.    Lab Results Recent Labs    11/17/20 0532 11/17/20 2200 11/18/20 0439  WBC 4.8  --  10.0  HGB 8.5*  --  8.5*  HCT 27.8*  --  27.3*  NA 137 136 134*  K 5.2* 5.5* 4.7  CL 107 105 104  CO2 22 20* 23  BUN 37* 39* 35*  CREATININE 1.34* 1.52* 1.47*   Liver Panel Recent Labs    11/16/20 1148  PROT 7.6  ALBUMIN 1.9*  AST 33  ALT 14  ALKPHOS 82  BILITOT 0.4   Sedimentation Rate No results for input(s): ESRSEDRATE in the last 72 hours. C-Reactive Protein No results for input(s): CRP in the last 72 hours.  Microbiology:  Studies/Results: No results found.   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   Acute on ckd = will check cr tomorrow if not much improved, may need to consider changing to alternative treatment for presumed pjp pneumonia  Thrush = continue on fluconazole  oi proph = will need weekly azithromycin 600mg  iv  weekly, will give a dose tomorrow.  [facility] for Infectious Diseases Cell: [phone] Pager: [phone]  11/18/2020, 5:34 PM

## 2020-11-18 NOTE — TOC Initial Note (Signed)
Transition of Care [facility]) - Initial/Assessment Note    Patient Details  Name: Krystal Bowen MRN: [id number] Date of Birth: 06-06-1971  [facility]) CM/SW Contact:    Joanne Chars, LCSW Phone Number: 11/18/2020, 1:44 PM  Clinical Narrative:  CSW met with pt to discuss discharge plan.  Pt immediately reports that she is tired.  CSW was able to discuss plan for SNF pt agrees to this plan, get permission to talk to parents, and son.  Pt is vaccinated for covid.  At this pt, pt said she was too tired to talk and CSW did not ask additional questions.  Choice document provided.                    Expected Discharge Plan: Skilled Nursing Facility Barriers to Discharge: Continued Medical Work up,SNF Pending bed offer   Patient Goals and CMS Choice   CMS Medicare.gov Compare Post Acute Care list provided to:: Patient Choice offered to / list presented to : Patient  Expected Discharge Plan and Services Expected Discharge Plan: [employer] Choice: [place] arrangements for the past 2 months: Single Family Home                                      Prior Living Arrangements/Services Living arrangements for the past 2 months: Single Family Home Lives with:: Minor Children,Parents Patient language and need for interpreter reviewed:: Yes        Need for Family Participation in Patient Care: Yes (Comment) Care giver support system in place?: Yes (comment)   Criminal Activity/Legal Involvement Pertinent to Current Situation/Hospitalization: No - Comment as needed  Activities of Daily Living Home Assistive Devices/Equipment: Walker (specify type) ADL Screening (condition at time of admission) Patient's cognitive ability adequate to safely complete daily activities?: Yes Is the patient deaf or have difficulty hearing?: No Does the patient have difficulty seeing, even when wearing glasses/contacts?: No Does  the patient have difficulty concentrating, remembering, or making decisions?: No Patient able to express need for assistance with ADLs?: Yes Does the patient have difficulty dressing or bathing?: Yes Independently performs ADLs?: No Does the patient have difficulty walking or climbing stairs?: Yes Weakness of Legs: Both Weakness of Arms/Hands: None  Permission Sought/Granted Permission sought to share information with : Family Supports Permission granted to share information with : Yes, Verbal Permission Granted  Share Information with NAME: mother, Berenice Primas.  Also father and son (names not listed on face sheet)  Permission granted to share info w AGENCY: SNF        Emotional Assessment Appearance:: Appears older than stated age Attitude/Demeanor/Rapport: Avoidant,Apprehensive Affect (typically observed): Anxious Orientation: : Oriented to Self,Oriented to Place,Oriented to  Time,Oriented to Situation Alcohol / Substance Use: Not Applicable Psych Involvement: No (comment)  Admission diagnosis:  Hypoxia [R09.02] Acute respiratory failure with hypoxia ([place]) [J96.01] Patient Active Problem List   Diagnosis Date Noted  . Protein-calorie malnutrition, severe 11/17/2020  . Sepsis due to undetermined organism with acute respiratory failure ([place]) 11/16/2020  . Insomnia 11/09/2020  . Unintentional weight loss 10/27/2020  . Unspecified severe protein-calorie malnutrition ([place]) 08/10/2020  . CMV retinitis ([place]) 02/18/2019  . Vulvar lesion 01/22/2019  . Thrush 01/09/2019  . Noncompliance w/medication treatment due to intermit use of medication 10/18/2018  . Depression 01/01/2018  . Peripheral neuropathy 02/13/2017  .  HIV disease ([place]) 02/12/2017  . HTN (hypertension) 02/12/2017  . Normocytic anemia 02/12/2017  . Dyslipidemia 02/12/2017  . History of shingles 02/12/2017   PCP:  Damaris Hippo, MD (Inactive) Pharmacy:   [facility] [place], [address] [place] [address] Phone: [phone] Fax: [phone]  [facility] [address] [place] AT [place] [facility] [place] [address] Phone: [phone] Fax: [phone]     Social Determinants of Health (SDOH) Interventions    Readmission Risk Interventions No flowsheet data found.

## 2020-11-18 NOTE — Progress Notes (Signed)
4pm pt. Respiration 30-40. MD made aware. No new order.

## 2020-11-18 NOTE — Progress Notes (Signed)
       RE:         Date of Birth:       Date:          To Whom It May Concern:  Please be advised that the above-named patient will require a short-term nursing home stay - anticipated 30 days or less for rehabilitation and strengthening.  The plan is for return home.                 MD signature                Date 

## 2020-11-18 NOTE — Progress Notes (Signed)
PROGRESS NOTE    Krystal Bowen  EZM:[id number]  DOB: Mar 25, 1971  DOA: 11/16/2020 PCP: Hoyt Koch, MD (Inactive) Outpatient Specialists:   Hospital course:  50 year old with history of HTN, HLD, retinal detachment with CMV retinitis, severe depression, HIV/AIDS admitted for acute hypoxia from multifocal pneumonia on 4 L nasal cannula thought to be secondary to likely PJP.Marland Kitchen  Started on IV Bactrim and steroids.   Subjective:  Patient states that she is feeling overwhelmed.  She notes there is a lot going on just feels overwhelmed.  Notes she usually prays as her way of coping.   Objective: Vitals:   11/18/20 1149 11/18/20 1637 11/18/20 1724 11/18/20 1830  BP: (!) 123/94 (!) 124/97 (!) 128/99 (!) 129/105  Pulse: (!) 102 (!) 104 94 89  Resp: (!) 32 (!) 36 (!) 35 (!) 39  Temp: 98.1 F (36.7 C) 98.3 F (36.8 C) 98 F (36.7 C) 98.3 F (36.8 C)  TempSrc: Oral Oral Oral Oral  SpO2: 94% 93% 96% 100%  Weight:      Height:        Intake/Output Summary (Last 24 hours) at 11/18/2020 1856 Last data filed at 11/18/2020 0919 Gross per 24 hour  Intake 3 ml  Output -  Net 3 ml   Filed Weights   11/16/20 1900  Weight: 34 kg     Exam:  General: Thin emaciated female lying in bed with unlabored tachypnea, shallow breaths. Eyes: sclera anicteric, conjuctiva mild injection bilaterally CVS: S1-S2, regular  Respiratory:  decreased air entry bilaterally secondary to decreased inspiratory effort, rales at bases  GI: NABS, soft, NT  LE: No edema.  Neuro: A/O x 3, Moving all extremities equally with normal strength, CN 3-12 intact, grossly nonfocal.  Psych: patient is logical and coherent, judgement and insight appear normal, mood and affect appropriate to situation.   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  Atypical pneumonia Patient seems to be improving on IV Bactrim and steroids Appreciate ongoing infectious disease  consultation  AKI Continue hydration, minimally improving Bactrim may be contributory  Mechanical fall due to weakness Appreciate PT and OT evaluation  Depression Restart home Elavil  HTN Hold HCTZ as patient was thought to be dehydrated Diastolics are elevated, systolics are low normal  HIV disease On Triumeq per ID Continue prophylaxis for weekly azithromycin Continue valganciclovir per home doses    DVT prophylaxis: Heparin subcu Code Status: DNR Family Communication: None Disposition Plan:   Patient is from: Home  Anticipated Discharge Location: TBD  Barriers to Discharge: Ongoing treatment for P JP  Is patient medically stable for Discharge: No   Consultants:  Infectious disease  Procedures:  None  Antimicrobials:  IV Bactrim   Data Reviewed:  Basic Metabolic Panel: Recent Labs  Lab 11/16/20 1148 11/16/20 1158 11/17/20 0532 11/17/20 2200 11/18/20 0439  NA 143 146* 137 136 134*  K 4.7 4.8 5.2* 5.5* 4.7  CL 106  --  107 105 104  CO2 23  --  22 20* 23  GLUCOSE 137*  --  118* 123* 135*  BUN 44*  --  37* 39* 35*  CREATININE 1.89*  --  1.34* 1.52* 1.47*  CALCIUM 8.5*  --  7.9* 8.0* 8.0*  MG  --   --   --   --  1.8   Liver Function Tests: Recent Labs  Lab 11/16/20 1148  AST 33  ALT 14  ALKPHOS 82  BILITOT 0.4  PROT 7.6  ALBUMIN 1.9*   No results for input(s): LIPASE, AMYLASE in the last 168 hours. No results for input(s): AMMONIA in the last 168 hours. CBC: Recent Labs  Lab 11/16/20 1148 11/16/20 1158 11/17/20 0532 11/18/20 0439  WBC 9.2  --  4.8 10.0  NEUTROABS 7.7  --   --   --   HGB 11.4* 11.9* 8.5* 8.5*  HCT 38.6 35.0* 27.8* 27.3*  MCV 103.8*  --  103.7* 100.7*  PLT PLATELET CLUMPS NOTED ON SMEAR, UNABLE TO ESTIMATE  --  PLATELET CLUMPS NOTED ON SMEAR, UNABLE TO ESTIMATE PLATELET CLUMPS NOTED ON SMEAR, UNABLE TO ESTIMATE   Cardiac Enzymes: No results for input(s): CKTOTAL, CKMB, CKMBINDEX, TROPONINI in the last 168  hours. BNP (last 3 results) No results for input(s): PROBNP in the last 8760 hours. CBG: Recent Labs  Lab 11/17/20 1147  GLUCAP 137*    Recent Results (from the past 240 hour(s))  SARS Coronavirus 2 by RT PCR (hospital order, performed in [facility] hospital lab) Nasopharyngeal Nasopharyngeal Swab     Status: None   Collection Time: 11/16/20 10:06 AM   Specimen: Nasopharyngeal Swab  Result Value Ref Range Status   SARS Coronavirus 2 NEGATIVE NEGATIVE Final    Comment: (NOTE) SARS-CoV-2 target nucleic acids are NOT DETECTED.  The SARS-CoV-2 RNA is generally detectable in upper and lower respiratory specimens during the acute phase of infection. The lowest concentration of SARS-CoV-2 viral copies this assay can detect is 250 copies / mL. A negative result does not preclude SARS-CoV-2 infection and should not be used as the sole basis for treatment or other patient management decisions.  A negative result may occur with improper specimen collection / handling, submission of specimen other than nasopharyngeal swab, presence of viral mutation(s) within the areas targeted by this assay, and inadequate number of viral copies (<250 copies / mL). A negative result must be combined with clinical observations, patient history, and epidemiological information.  Fact Sheet for Patients:   BoilerBrush.com.cy  Fact Sheet for Healthcare Providers: https://pope.com/  This test is not yet approved or  cleared by the [place] FDA and has been authorized for detection and/or diagnosis of SARS-CoV-2 by FDA under an Emergency Use Authorization (EUA).  This EUA will remain in effect (meaning this test can be used) for the duration of the COVID-19 declaration under Section 564(b)(1) of the Act, 21 U.S.C. section 360bbb-3(b)(1), unless the authorization is terminated or revoked sooner.  Performed at [facility], [address]  [place], [place] [address]   Culture, blood (Routine X 2) w Reflex to ID Panel     Status: None (Preliminary result)   Collection Time: 11/17/20 11:11 AM   Specimen: BLOOD RIGHT ARM  Result Value Ref Range Status   Specimen Description BLOOD RIGHT ARM  Final   Special Requests   Final    BOTTLES DRAWN AEROBIC ONLY Blood Culture results may not be optimal due to an inadequate volume of blood received in culture bottles   Culture   Final    NO GROWTH 1 DAY Performed at [facility], [address] [place], [place] [address]    Report Status PENDING  Incomplete  Culture, blood (Routine X 2) w Reflex to ID Panel     Status: None (Preliminary result)   Collection Time: 11/17/20 11:11 AM   Specimen: BLOOD LEFT HAND  Result Value Ref Range Status   Specimen Description BLOOD LEFT HAND  Final  Special Requests   Final    BOTTLES DRAWN AEROBIC AND ANAEROBIC Blood Culture results may not be optimal due to an inadequate volume of blood received in culture bottles   Culture   Final    NO GROWTH 1 DAY Performed at [facility], [address] [place], [place] [address]    Report Status PENDING  Incomplete      Studies: No results found.   Scheduled Meds: . abacavir-dolutegravir-lamiVUDine  1 tablet Oral Daily  . feeding supplement  237 mL Oral TID BM  . fluconazole  200 mg Oral Daily  . heparin injection (subcutaneous)  5,000 Units Subcutaneous Q8H  . ipratropium-albuterol  3 mL Nebulization Q6H  . methylPREDNISolone (SOLU-MEDROL) injection  60 mg Intravenous Q12H  . multivitamin with minerals  1 tablet Oral Daily  . sodium chloride flush  3 mL Intravenous Q12H  . valGANciclovir  450 mg Oral Once per day on Mon Thu   Continuous Infusions: . lactated ringers 100 mL/hr at 11/18/20 1223  . sulfamethoxazole-trimethoprim 170.08 mg (11/18/20 6415)    Principal Problem:   Sepsis due to undetermined organism with acute respiratory failure (HCC) Active Problems:   HIV disease  (HCC)   HTN (hypertension)   Dyslipidemia   Depression   Noncompliance w/medication treatment due to intermit use of medication   Thrush   CMV retinitis (HCC)   Unspecified severe protein-calorie malnutrition (HCC)   Protein-calorie malnutrition, severe     Srobona Tublu Chatterjee, [facility]  If 7PM-7AM, please contact night-coverage [email or web address] Password [facility] 11/18/2020, 6:56 PM    LOS: 2 days

## 2020-11-18 NOTE — Progress Notes (Signed)
Red MEWS. MD made aware of elevated respirations. No new orders. Implemented Red MEWS.

## 2020-11-19 ENCOUNTER — Other Ambulatory Visit: Payer: Self-pay | Admitting: Internal Medicine

## 2020-11-19 DIAGNOSIS — B20 Human immunodeficiency virus [HIV] disease: Secondary | ICD-10-CM

## 2020-11-19 DIAGNOSIS — A419 Sepsis, unspecified organism: Secondary | ICD-10-CM | POA: Diagnosis not present

## 2020-11-19 DIAGNOSIS — R6520 Severe sepsis without septic shock: Secondary | ICD-10-CM | POA: Diagnosis not present

## 2020-11-19 DIAGNOSIS — J9600 Acute respiratory failure, unspecified whether with hypoxia or hypercapnia: Secondary | ICD-10-CM | POA: Diagnosis not present

## 2020-11-19 LAB — BASIC METABOLIC PANEL
Anion gap: 8 (ref 5–15)
BUN: 27 mg/dL — ABNORMAL HIGH (ref 6–20)
CO2: 23 mmol/L (ref 22–32)
Calcium: 8.2 mg/dL — ABNORMAL LOW (ref 8.9–10.3)
Chloride: 102 mmol/L (ref 98–111)
Creatinine, Ser: 1.2 mg/dL — ABNORMAL HIGH (ref 0.44–1.00)
GFR, Estimated: 55 mL/min — ABNORMAL LOW (ref >60)
Glucose, Bld: 96 mg/dL (ref 70–99)
Potassium: 5.4 mmol/L — ABNORMAL HIGH (ref 3.5–5.1)
Sodium: 133 mmol/L — ABNORMAL LOW (ref 135–145)

## 2020-11-19 LAB — CBC
HCT: 25.1 % — ABNORMAL LOW (ref 36.0–46.0)
Hemoglobin: 8 g/dL — ABNORMAL LOW (ref 12.0–15.0)
MCH: 31.6 pg (ref 26.0–34.0)
MCHC: 31.9 g/dL (ref 30.0–36.0)
MCV: 99.2 fL (ref 80.0–100.0)
Platelets: UNDETERMINED 10*3/uL (ref 150–400)
RBC: 2.53 MIL/uL — ABNORMAL LOW (ref 3.87–5.11)
RDW: 16.3 % — ABNORMAL HIGH (ref 11.5–15.5)
WBC: 7.2 10*3/uL (ref 4.0–10.5)
nRBC: 0 % (ref 0.0–0.2)

## 2020-11-19 LAB — MAGNESIUM: Magnesium: 1.8 mg/dL (ref 1.7–2.4)

## 2020-11-19 MED ORDER — SULFAMETHOXAZOLE-TRIMETHOPRIM 800-160 MG PO TABS
1.0000 | ORAL_TABLET | Freq: Two times a day (BID) | ORAL | Status: DC
  Administered 2020-11-19 – 2020-11-24 (×10): 1 via ORAL
  Filled 2020-11-19 (×12): qty 1

## 2020-11-19 MED ORDER — PREDNISONE 20 MG PO TABS
40.0000 mg | ORAL_TABLET | Freq: Two times a day (BID) | ORAL | Status: DC
  Administered 2020-11-19 – 2020-11-23 (×8): 40 mg via ORAL
  Filled 2020-11-19 (×8): qty 2

## 2020-11-19 MED ORDER — LORAZEPAM 2 MG/ML IJ SOLN
INTRAMUSCULAR | Status: AC
  Administered 2020-11-19: 2 mg
  Filled 2020-11-19: qty 1

## 2020-11-19 MED ORDER — LACTULOSE 10 GM/15ML PO SOLN
10.0000 g | Freq: Two times a day (BID) | ORAL | Status: DC | PRN
  Administered 2020-11-20: 10 g via ORAL
  Filled 2020-11-19: qty 15

## 2020-11-19 MED ORDER — DOCUSATE SODIUM 50 MG/5ML PO LIQD
100.0000 mg | Freq: Two times a day (BID) | ORAL | Status: DC | PRN
  Administered 2020-11-21: 100 mg via ORAL
  Filled 2020-11-19: qty 10

## 2020-11-19 MED ORDER — BISACODYL 5 MG PO TBEC
5.0000 mg | DELAYED_RELEASE_TABLET | Freq: Every day | ORAL | Status: DC | PRN
  Administered 2020-11-20: 5 mg via ORAL
  Filled 2020-11-19: qty 1

## 2020-11-19 MED ORDER — LORAZEPAM 1 MG PO TABS: 1.0000 mg | ORAL_TABLET | ORAL | Status: AC

## 2020-11-19 NOTE — Plan of Care (Signed)

## 2020-11-19 NOTE — Progress Notes (Signed)
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   Subjective:  Patient is calm when I see her however patient had been very anxious according to the nurse.  She had called at multiple times a day saying that she could not breathe because she was anxious.  She would have significant increased respiratory rate during these times.  Patient had previously declined to take medication saying that she would like to use prior as her coping mechanism however today patient said "I will take anything just calm me down".  Objective: Vitals:   11/19/20 0503 11/19/20 1101 11/19/20 1319 11/19/20 1424  BP: (!) 115/97 (!) 136/109    Pulse: 90 (!) 107    Resp: 20 20    Temp: 97.8 F (36.6 C) 98 F (36.7 C)    TempSrc: Oral Oral    SpO2: 100% (!) 88% (!) 88% 94%  Weight:      Height:        Intake/Output Summary (Last 24 hours) at 11/19/2020 1605 Last data filed at 11/19/2020 0505 Gross per 24 hour  Intake -  Output 400 ml  Net -400 ml   Filed Weights   11/16/20 1900  Weight: 34 kg     Exam:  General: Thin emaciated female lying in bed.  She had received some Ativan orally. Eyes: sclera anicteric, conjuctiva mild injection bilaterally CVS: S1-S2, regular  Respiratory:  decreased air entry bilaterally secondary to decreased inspiratory effort, rales at bases  GI: NABS, soft, NT  LE: No edema.  Neuro: A/O x 3, Moving all extremities equally with normal strength, CN 3-12 intact, grossly nonfocal.  Psych: patient is logical and coherent, judgement and insight appear normal, mood and affect appropriate to  situation.   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   Anxiety She was extremely anxious earlier bordering on panic attack She has responded very well to Ativan 1 mg p.o. x1 We will write her for another 1 mg at bedtime as needed. She has also been started back on her home Elavil. BH H consult placed for the morning.  Atypical pneumonia Patient seems to be improving on IV Bactrim and steroids Appreciate ongoing infectious disease consultation  Hyperkalemia Possibly secondary to Bactrim, will follow  AKI Back to baseline per Dr. Drue Second ID who knows her well.  Mechanical fall due to weakness Appreciate PT and OT evaluation  HTN Hold HCTZ as patient was thought to be dehydrated Diastolics are elevated, systolics are low normal  HIV disease On Triumeq per ID Continue prophylaxis for weekly azithromycin Continue valganciclovir per home doses    DVT prophylaxis: Heparin subcu Code Status: DNR Family Communication: None Disposition Plan:   Patient is from: Home  Anticipated Discharge Location: TBD  Barriers to Discharge: Ongoing treatment for P JP  Is patient medically stable for Discharge: No   Consultants:  Infectious disease  Procedures:  None  Antimicrobials:  IV Bactrim   Data Reviewed:  Basic Metabolic Panel: Recent Labs  Lab 11/16/20 1148 11/16/20 1158 11/17/20 0532 11/17/20 2200 11/18/20 0439 11/19/20 0458  NA 143 146* 137 136  134* 133*  K 4.7 4.8 5.2* 5.5* 4.7 5.4*  CL 106  --  107 105 104 102  CO2 23  --  22 20* 23 23  GLUCOSE 137*  --  118* 123* 135* 96  BUN 44*  --  37* 39* 35* 27*  CREATININE 1.89*  --  1.34* 1.52* 1.47* 1.20*  CALCIUM 8.5*  --  7.9* 8.0* 8.0* 8.2*  MG  --   --   --   --  1.8 1.8   Liver Function Tests: Recent Labs  Lab 11/16/20 1148  AST 33  ALT 14  ALKPHOS 82  BILITOT 0.4  PROT 7.6  ALBUMIN 1.9*   No results for input(s): LIPASE, AMYLASE in the  last 168 hours. No results for input(s): AMMONIA in the last 168 hours. CBC: Recent Labs  Lab 11/16/20 1148 11/16/20 1158 11/17/20 0532 11/18/20 0439 11/19/20 0458  WBC 9.2  --  4.8 10.0 7.2  NEUTROABS 7.7  --   --   --   --   HGB 11.4* 11.9* 8.5* 8.5* 8.0*  HCT 38.6 35.0* 27.8* 27.3* 25.1*  MCV 103.8*  --  103.7* 100.7* 99.2  PLT PLATELET CLUMPS NOTED ON SMEAR, UNABLE TO ESTIMATE  --  PLATELET CLUMPS NOTED ON SMEAR, UNABLE TO ESTIMATE PLATELET CLUMPS NOTED ON SMEAR, UNABLE TO ESTIMATE PLATELET CLUMPS NOTED ON SMEAR, UNABLE TO ESTIMATE   Cardiac Enzymes: No results for input(s): CKTOTAL, CKMB, CKMBINDEX, TROPONINI in the last 168 hours. BNP (last 3 results) No results for input(s): PROBNP in the last 8760 hours. CBG: Recent Labs  Lab 11/17/20 1147  GLUCAP 137*    Recent Results (from the past 240 hour(s))  SARS Coronavirus 2 by RT PCR (hospital order, performed in [facility] hospital lab) Nasopharyngeal Nasopharyngeal Swab     Status: None   Collection Time: 11/16/20 10:06 AM   Specimen: Nasopharyngeal Swab  Result Value Ref Range Status   SARS Coronavirus 2 NEGATIVE NEGATIVE Final    Comment: (NOTE) SARS-CoV-2 target nucleic acids are NOT DETECTED.  The SARS-CoV-2 RNA is generally detectable in upper and lower respiratory specimens during the acute phase of infection. The lowest concentration of SARS-CoV-2 viral copies this assay can detect is 250 copies / mL. A negative result does not preclude SARS-CoV-2 infection and should not be used as the sole basis for treatment or other patient management decisions.  A negative result may occur with improper specimen collection / handling, submission of specimen other than nasopharyngeal swab, presence of viral mutation(s) within the areas targeted by this assay, and inadequate number of viral copies (<250 copies / mL). A negative result must be combined with clinical observations, patient history, and epidemiological  information.  Fact Sheet for Patients:   BoilerBrush.com.cy  Fact Sheet for Healthcare Providers: https://pope.com/  This test is not yet approved or  cleared by the [place] FDA and has been authorized for detection and/or diagnosis of SARS-CoV-2 by FDA under an Emergency Use Authorization (EUA).  This EUA will remain in effect (meaning this test can be used) for the duration of the COVID-19 declaration under Section 564(b)(1) of the Act, 21 U.S.C. section 360bbb-3(b)(1), unless the authorization is terminated or revoked sooner.  Performed at [facility], [address], [place], [place] [address]   Culture, blood (Routine X 2) w Reflex to ID Panel     Status: None (Preliminary result)   Collection Time: 11/17/20 11:11 AM   Specimen: BLOOD RIGHT ARM  Result  Value Ref Range Status   Specimen Description BLOOD RIGHT ARM  Final   Special Requests   Final    BOTTLES DRAWN AEROBIC ONLY Blood Culture results may not be optimal due to an inadequate volume of blood received in culture bottles   Culture   Final    NO GROWTH 2 DAYS Performed at [facility], [address] [place], [place] [address]    Report Status PENDING  Incomplete  Culture, blood (Routine X 2) w Reflex to ID Panel     Status: None (Preliminary result)   Collection Time: 11/17/20 11:11 AM   Specimen: BLOOD LEFT HAND  Result Value Ref Range Status   Specimen Description BLOOD LEFT HAND  Final   Special Requests   Final    BOTTLES DRAWN AEROBIC AND ANAEROBIC Blood Culture results may not be optimal due to an inadequate volume of blood received in culture bottles   Culture   Final    NO GROWTH 2 DAYS Performed at [facility], [address] [place], [place] [address]    Report Status PENDING  Incomplete      Studies: No results found.   Scheduled Meds: . abacavir-dolutegravir-lamiVUDine  1 tablet Oral Daily  . feeding supplement  237  mL Oral TID BM  . fluconazole  200 mg Oral Daily  . heparin injection (subcutaneous)  5,000 Units Subcutaneous Q8H  . multivitamin with minerals  1 tablet Oral Daily  . predniSONE  40 mg Oral BID  . sodium chloride flush  3 mL Intravenous Q12H  . sulfamethoxazole-trimethoprim  1 tablet Oral Q12H  . valGANciclovir  450 mg Oral Once per day on Mon Thu   Continuous Infusions: . lactated ringers 100 mL/hr at 11/19/20 4696    Principal Problem:   Sepsis due to undetermined organism with acute respiratory failure (HCC) Active Problems:   HIV disease (HCC)   HTN (hypertension)   Dyslipidemia   Depression   Noncompliance w/medication treatment due to intermit use of medication   Thrush   CMV retinitis (HCC)   Unspecified severe protein-calorie malnutrition (HCC)   Protein-calorie malnutrition, severe     Tekla Malachowski Tublu Felipe Paluch, [facility]  If 7PM-7AM, please contact night-coverage [email or web address] Password [email or web address] 11/19/2020, 4:05 PM    LOS: 3 days

## 2020-11-19 NOTE — Progress Notes (Signed)
PT Cancellation Note  Patient Details Name: Krystal Bowen MRN: [id number] DOB: [birth year]   Cancelled Treatment:    Reason Eval/Treat Not Completed: Fatigue/lethargy limiting ability to participate;Medical issues which prohibited therapy. Pt reporting fatigue, also with resting RR of 45-51 with conversation. PT will follow up as time allows.   Arlyss Gandy 11/19/2020, 5:28 PM

## 2020-11-19 NOTE — Progress Notes (Signed)
Pt. extremely anxious, crying out help me. HR 150's Resp 50s O2 saturations 75-83%. Increased oxygen to 11LPM no improvement. NOnrebreather at 15LPM applied. MD made aware new ordered or ativan

## 2020-11-19 NOTE — TOC Progression Note (Signed)
Transition of Care [facility]) - Progression Note    Patient Details  Name: Krystal Bowen MRN: [id number] Date of Birth: May 29, 1971  Transition of Care [facility]) CM/SW Contact  Lorri Frederick, LCSW Phone Number: 11/19/2020, 8:18 AM  Clinical Narrative:   Level 2 PASSR received: [id number]    Expected Discharge Plan: Skilled Nursing Facility Barriers to Discharge: Continued Medical Work up,SNF Pending bed offer  Expected Discharge Plan and Services Expected Discharge Plan: Skilled Nursing Facility     Post Acute Care Choice: Skilled Nursing Facility Living arrangements for the past 2 months: Single Family Home                                       Social Determinants of Health (SDOH) Interventions    Readmission Risk Interventions No flowsheet data found.

## 2020-11-19 NOTE — Progress Notes (Signed)
[facility]    Date of Admission:  11/16/2020   Total days of antibiotics 3           ID: Krystal Bowen is a 50 y.o. female with  Principal Problem:   Sepsis due to undetermined organism with acute respiratory failure (HCC) Active Problems:   HIV disease (HCC)   HTN (hypertension)   Dyslipidemia   Depression   Noncompliance w/medication treatment due to intermit use of medication   Thrush   CMV retinitis (HCC)   Unspecified severe protein-calorie malnutrition (HCC)   Protein-calorie malnutrition, severe    Subjective: Feeling anxious which makes her short of breath. Breathing overall improved. Eating well  Medications:  . abacavir-dolutegravir-lamiVUDine  1 tablet Oral Daily  . feeding supplement  237 mL Oral TID BM  . fluconazole  200 mg Oral Daily  . heparin injection (subcutaneous)  5,000 Units Subcutaneous Q8H  . multivitamin with minerals  1 tablet Oral Daily  . predniSONE  40 mg Oral BID  . sodium chloride flush  3 mL Intravenous Q12H  . sulfamethoxazole-trimethoprim  1 tablet Oral Q12H  . valGANciclovir  450 mg Oral Once per day on Mon Thu    Objective: Vital signs in last 24 hours: Temp:  [97.8 F (36.6 C)-98.4 F (36.9 C)] 98 F (36.7 C) (01/28 1101) Pulse Rate:  [77-107] 107 (01/28 1101) Resp:  [20-39] 20 (01/28 1101) BP: (115-140)/(95-109) 136/109 (01/28 1101) SpO2:  [88 %-100 %] 94 % (01/28 1424)  Physical Exam  Constitutional:  oriented to person, place, and time. appears frail and malnourished. No distress.  HENT: [place]/AT, PERRLA, no scleral icterus Mouth/Throat: Oropharynx is clear and moist. Thrush improved Cardiovascular: Normal rate, regular rhythm and normal heart sounds. Exam reveals no gallop and no friction rub.  No murmur heard.  Pulmonary/Chest: Effort normal and breath sounds have bilateral rhonchi Neck = supple, no nuchal rigidity Abdominal: Soft. Bowel sounds are normal.  exhibits no distension. There is  no tenderness.  Lymphadenopathy: no cervical adenopathy. No axillary adenopathy Neurological: alert and oriented to person, place, and time.  Skin: Skin is warm and dry and scaly Psychiatric: a normal mood and affect.  behavior is normal.    Lab Results Recent Labs    11/18/20 0439 11/19/20 0458  WBC 10.0 7.2  HGB 8.5* 8.0*  HCT 27.3* 25.1*  NA 134* 133*  K 4.7 5.4*  CL 104 102  CO2 23 23  BUN 35* 27*  CREATININE 1.47* 1.20*    Microbiology: reviewed Studies/Results: No results found.   Assessment/Plan: hiv disease = continue on triumeq  Presumed pjp pneumonia = will change to oral bactrim 1 DS BID and reduce steroid dose to pred 40mg  bid  Hx of CMV retinitis = needs valcyte 450mg  M-Thursday - unclear if she was taking more frequently which could contribute to her initial acute on ckd  Acute on ckd = now back to her baseline   Hyperkalemia = would like to see how she does on oral bactrim before deciding this is the final regimen for pjp pna. If she has ongoing hyperK while on oral bactrim, may need to change to alternative regimen  Thrush = continue on fluconazole 200mg  daily. Recommend to treat for ongoing course of 21 days since she will be on extended steroids  Severe protein calorie malnutrition = continue with supplementation  Anxiety = defer to primary team for management. Consider non benzo intervention  Dr to see  over the weekend  [facility] for Infectious Diseases Cell: [phone] Pager: [phone]  11/19/2020, 3:55 PM

## 2020-11-20 DIAGNOSIS — A419 Sepsis, unspecified organism: Principal | ICD-10-CM

## 2020-11-20 DIAGNOSIS — Z7189 Other specified counseling: Secondary | ICD-10-CM

## 2020-11-20 DIAGNOSIS — B59 Pneumocystosis: Secondary | ICD-10-CM

## 2020-11-20 DIAGNOSIS — R6520 Severe sepsis without septic shock: Secondary | ICD-10-CM

## 2020-11-20 DIAGNOSIS — B20 Human immunodeficiency virus [HIV] disease: Secondary | ICD-10-CM

## 2020-11-20 DIAGNOSIS — F4323 Adjustment disorder with mixed anxiety and depressed mood: Secondary | ICD-10-CM

## 2020-11-20 DIAGNOSIS — J9601 Acute respiratory failure with hypoxia: Secondary | ICD-10-CM | POA: Diagnosis not present

## 2020-11-20 DIAGNOSIS — Z515 Encounter for palliative care: Secondary | ICD-10-CM

## 2020-11-20 DIAGNOSIS — J9600 Acute respiratory failure, unspecified whether with hypoxia or hypercapnia: Secondary | ICD-10-CM

## 2020-11-20 DIAGNOSIS — Z66 Do not resuscitate: Secondary | ICD-10-CM

## 2020-11-20 DIAGNOSIS — B370 Candidal stomatitis: Secondary | ICD-10-CM

## 2020-11-20 DIAGNOSIS — R0602 Shortness of breath: Secondary | ICD-10-CM

## 2020-11-20 LAB — MAGNESIUM: Magnesium: 1.9 mg/dL (ref 1.7–2.4)

## 2020-11-20 LAB — BASIC METABOLIC PANEL
Anion gap: 5 (ref 5–15)
BUN: 26 mg/dL — ABNORMAL HIGH (ref 6–20)
CO2: 26 mmol/L (ref 22–32)
Calcium: 8.2 mg/dL — ABNORMAL LOW (ref 8.9–10.3)
Chloride: 102 mmol/L (ref 98–111)
Creatinine, Ser: 1.22 mg/dL — ABNORMAL HIGH (ref 0.44–1.00)
GFR, Estimated: 54 mL/min — ABNORMAL LOW (ref >60)
Glucose, Bld: 106 mg/dL — ABNORMAL HIGH (ref 70–99)
Potassium: 5.5 mmol/L — ABNORMAL HIGH (ref 3.5–5.1)
Sodium: 133 mmol/L — ABNORMAL LOW (ref 135–145)

## 2020-11-20 LAB — CBC
HCT: 25.4 % — ABNORMAL LOW (ref 36.0–46.0)
Hemoglobin: 7.8 g/dL — ABNORMAL LOW (ref 12.0–15.0)
MCH: 30.7 pg (ref 26.0–34.0)
MCHC: 30.7 g/dL (ref 30.0–36.0)
MCV: 100 fL (ref 80.0–100.0)
Platelets: 69 10*3/uL — ABNORMAL LOW (ref 150–400)
RBC: 2.54 MIL/uL — ABNORMAL LOW (ref 3.87–5.11)
RDW: 16.2 % — ABNORMAL HIGH (ref 11.5–15.5)
WBC: 6.8 10*3/uL (ref 4.0–10.5)
nRBC: 0 % (ref 0.0–0.2)

## 2020-11-20 LAB — LACTATE DEHYDROGENASE: LDH: 341 U/L — ABNORMAL HIGH (ref 98–192)

## 2020-11-20 LAB — CRYPTOCOCCAL ANTIGEN: Crypto Ag: NEGATIVE

## 2020-11-20 MED ORDER — VALGANCICLOVIR HCL 450 MG PO TABS
450.0000 mg | ORAL_TABLET | ORAL | Status: DC
  Administered 2020-11-20 – 2020-11-22 (×2): 450 mg via ORAL
  Filled 2020-11-20 (×3): qty 1

## 2020-11-20 MED ORDER — CITALOPRAM HYDROBROMIDE 20 MG PO TABS
10.0000 mg | ORAL_TABLET | Freq: Every day | ORAL | Status: DC
Start: 2020-11-20 — End: 2020-11-20
  Administered 2020-11-20: 10 mg via ORAL
  Filled 2020-11-20: qty 1

## 2020-11-20 MED ORDER — BUSPIRONE HCL 15 MG PO TABS: 7.5000 mg | ORAL_TABLET | Freq: Two times a day (BID) | ORAL | Status: DC

## 2020-11-20 MED ORDER — MIRTAZAPINE 15 MG PO TABS
7.5000 mg | ORAL_TABLET | Freq: Every day | ORAL | Status: DC
  Administered 2020-11-20 – 2020-11-24 (×5): 7.5 mg via ORAL
  Filled 2020-11-20 (×5): qty 1

## 2020-11-20 MED ORDER — MORPHINE SULFATE (CONCENTRATE) 10 MG/0.5ML PO SOLN
5.0000 mg | ORAL | Status: DC | PRN
  Administered 2020-11-21 – 2020-11-22 (×3): 5 mg via SUBLINGUAL
  Filled 2020-11-20 (×3): qty 0.5

## 2020-11-20 MED ORDER — AZITHROMYCIN 600 MG PO TABS
1200.0000 mg | ORAL_TABLET | ORAL | Status: DC
  Filled 2020-11-20: qty 2

## 2020-11-20 MED ORDER — SODIUM CHLORIDE 0.9 % IV SOLN
INTRAVENOUS | Status: DC
  Administered 2020-11-22 – 2020-11-24 (×3): 1 mL via INTRAVENOUS

## 2020-11-20 MED ORDER — DOLUTEGRAVIR SODIUM 50 MG PO TABS
50.0000 mg | ORAL_TABLET | Freq: Every day | ORAL | Status: DC
  Administered 2020-11-20 – 2020-11-27 (×7): 50 mg via ORAL
  Filled 2020-11-20 (×8): qty 1

## 2020-11-20 MED ORDER — AZITHROMYCIN 600 MG PO TABS
1200.0000 mg | ORAL_TABLET | ORAL | Status: DC
  Administered 2020-11-20: 1200 mg via ORAL
  Filled 2020-11-20 (×2): qty 2

## 2020-11-20 MED ORDER — DARUNAVIR-COBICISTAT 800-150 MG PO TABS
1.0000 | ORAL_TABLET | Freq: Every day | ORAL | Status: DC
  Administered 2020-11-21 – 2020-11-24 (×4): 1 via ORAL
  Filled 2020-11-20 (×4): qty 1

## 2020-11-20 NOTE — Progress Notes (Signed)
TRH Brief Note Received call from patient's youngest daughter that she was worried about her mother's CODE STATUS and clinical care. We discussed this at length discussed meaning of CODE STATUS and DO NOT RESUSCITATE order. All questions were answered to daughter satisfaction  Terisa Starr, MD  Family Medicine Teaching Service  For [facility]

## 2020-11-20 NOTE — Progress Notes (Addendum)
[facility]    Date of Admission:  11/16/2020   Total days of antibiotics 3   ID: Krystal Bowen is a 50 y.o. female with  Principal Problem:   Sepsis due to undetermined organism with acute respiratory failure ([place]) Active Problems:   HIV disease ([place])   HTN (hypertension)   Dyslipidemia   Depression   Noncompliance w/medication treatment due to intermit use of medication   Thrush   CMV retinitis ([place])   Unspecified severe protein-calorie malnutrition ([place])   Protein-calorie malnutrition, severe   Adjustment disorder with mixed anxiety and depressed mood    Subjective: Oxygen requirement increases;  No new sx No headache Mild dry cough No chest pain Sob better Feeling anxious No bloody/black stool No pain withswallowing No rash No joint pain No visual disturbance that is new (mild blurriness in the left eye stable; last ophthalmologic visit 08/2020?)  ROS: 12 point ros is negative, except for fatigue  Medications:  . abacavir-dolutegravir-lamiVUDine  1 tablet Oral Daily  . citalopram  10 mg Oral Daily  . feeding supplement  237 mL Oral TID BM  . fluconazole  200 mg Oral Daily  . heparin injection (subcutaneous)  5,000 Units Subcutaneous Q8H  . multivitamin with minerals  1 tablet Oral Daily  . predniSONE  40 mg Oral BID  . sodium chloride flush  3 mL Intravenous Q12H  . sulfamethoxazole-trimethoprim  1 tablet Oral Q12H  . valGANciclovir  450 mg Oral Once per day on Mon Thu    Objective: Vital signs in last 24 hours: Temp:  [97.5 F (36.4 C)-98.7 F (37.1 C)] 97.5 F (36.4 C) (01/29 8916) Pulse Rate:  [65-113] 113 (01/29 0838) Resp:  [19-28] 24 (01/29 0838) BP: (120-128)/(89-100) 125/100 (01/29 0838) SpO2:  [93 %-100 %] 93 % (01/29 0838)  o2 bouncing between 7-15 liters today  Physical Exam  Constitutional:  No distress; conversant; chronically ill appearing/thin HEENT: conj clear; eomi; oropharynx is clear and moist.  thrush improved per patient Cardiovascular: rrr no mrg Lungs mild rales; normal respiratory effort Neck = supple, no nuchal rigidity Abdominal: Soft. Bowel sounds are normal.  exhibits no distension. There is no tenderness.  Lymphadenopathy: no cervical adenopathy. No axillary adenopathy Neurological: nonfocal Skin: no rash Psychiatric: a normal mood and affect.  behavior is normal.    Lab Results Recent Labs    11/19/20 0458 11/20/20 0113  WBC 7.2 6.8  HGB 8.0* 7.8*  HCT 25.1* 25.4*  NA 133* 133*  K 5.4* 5.5*  CL 102 102  CO2 23 26  BUN 27* 26*  CREATININE 1.20* 1.22*   Liver Panel No results for input(s): PROT, ALBUMIN, AST, ALT, ALKPHOS, BILITOT, BILIDIR, IBILI in the last 72 hours. Sedimentation Rate No results for input(s): ESRSEDRATE in the last 72 hours. C-Reactive Protein No results for input(s): CRP in the last 72 hours.  Serology: Lab Results  Component Value Date   HIV1RNAQUANT 67,600 (H) 10/27/2020   HIV1RNAQUANT 32,500 (H) 08/10/2020   HIV1RNAQUANT 279,000 (H) 06/08/2020   Lab Results  Component Value Date   CD4TABS <35 (L) 10/27/2020   CD4TABS <35 (L) 08/10/2020   CD4TABS <35 (L) 06/08/2020   10/27/20 rpr negative  Microbiology: 1/26 bcx negative  Studies/Results: 1/25 cxr I reviewed Bilateral multifocal patchy opacity Widespread airspace opacity bilaterally, concerning for multifocal pneumonia, likely of atypical organism etiology. Differential considerations must include noncardiogenic pulmonary edema and widespread aspiration.  Heart size normal.  Assessment/Plan: 50 yo female  with aids, poor compliance, cmv retinitis, admitted 1/25 after a GLF and right hip injury, found to have hypoxemic respiratory failure (60% RA at presentation) concerned for pjp pna, also with oral candidiasis  #hypoxemic resp failure #severe pjp pna She is r/o'ed for covid infection; presumed pjp; cxr bilateral patchy focal opacities ddx is wide though to  include other OI, kaposi, mac, endemic fungi, tb  -continue oral bactrim -continue prednisone pjp course of 21 days; day 1 = 1/25 -I discussed with dr Owens Shark to resend pjp smear, although it appears she might not be able to give sputum -if no improvement in the next 3-5 days, I would consider repeat chest imaging in form of ct, along with abd pelvis imaging (especially if lft elevation, worsening cytopenia) and ask pulm to do bronchoscopy to help with gettign sample for diagnostics   #aids Hx noncompliance Current ART is triumeq vl in the past year suggest she is not taking triumeq consistently if at all 1/05 genotype without integrase strand inhibitor resistance; 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; PR mutation m46L, A71V, A82I, V77I -I think optimal ART for her would be prezcobix/tivicay based on her genotype testing and adherence -stop triumeq -start prezcobix/tivicay -DDI analysis along with other meds/art Stop amitryptiline, celexa Start remeron 7.5 mg qhs   #hx cmv retinitis She had tx in the past; would continue maintenance therapy due to high risk of relapse with current immune status She reports chronic stable left eye blurriness; she reports her last eye exam was 08/2020 -continue valgangciclovir 450 mg renal dosing q48hours until cd4 > 100 for at least 3 months, and no ophthalmologic concern and on stable art   #oral thrush -continue fluconazole 400 mg po daily; plan 3 week course until 12/03/2020  #other OI prophy -continue weekly azithromycin 1200 mg; would keep as she hasn't demonstrated to be consistently taking her ART  #thrombocytopenia/anemia With her chronicly depressed cd4 <50, I would send for fungal serology and afb bcx along with ldh to complete w/u  There is potentially contribution from bactrim/valcyte but these were just started; so will keep them on and continue to monitor -Other non-id causes being worked per primary team -continue valcyte/bactrim -send serum  crypto ag, urine histo ag, blasto ag, blood afb culture -check ldh  #hcm A) std screen 10/27/20 rpr negative -send urine gc/chlam B) tb/metabolic/women's health -- defer to outpatient     [facility]  11/20/2020, 1:51 PM

## 2020-11-20 NOTE — Plan of Care (Signed)

## 2020-11-20 NOTE — Consult Note (Signed)
Telepsych Consultation   Reason for Consult: ''50 year old with end-stage age is extremely anxious and has been having what appeared to be panic attacks in the hospital.'' Referring Physician: Terisa Starr, MD Location of Patient: 2W Location of Provider: [facility]  Patient Identification: JENNYFER NICKOLSON MRN:  [id number] Principal Diagnosis: Sepsis due to undetermined organism with acute respiratory failure [facility]) Diagnosis:  Principal Problem:   Sepsis due to undetermined organism with acute respiratory failure (HCC) Active Problems:   HIV disease (HCC)   HTN (hypertension)   Dyslipidemia   Depression   Noncompliance w/medication treatment due to intermit use of medication   Thrush   CMV retinitis (HCC)   Unspecified severe protein-calorie malnutrition (HCC)   Protein-calorie malnutrition, severe   Adjustment disorder with mixed anxiety and depressed mood   Total Time spent with patient: 1 hour  Subjective:   LAURALI GODDARD is a 50 y.o. female patient admitted with Sepsis due to undetermined organism with respiratory failure.  HPI:  50 year old with history of HTN, HLD, retinal detachment with CMV retinitis, severe depression, HIV/AIDS admitted for acute hypoxia from multifocal pneumonia. Psychiatric service was consulted due to severe anxiety and panic attacks. Today, patient reports feeling less anxious and appears calm. However, she reports occasional anxiety, worries, apprehensions and depressive symptoms due her multiple medical issues. But she denies psychosis, delusions and self harming thoughts. She reports history of counseling for depression at the infectious center but she is requesting to get on medication for anxiety and depression.   Past Psychiatric History: Depression  Risk to Self:  denies Risk to Others:  denies Prior Inpatient Therapy:  none reported Prior Outpatient Therapy:  [facility]  Past Medical History:  Past Medical  History:  Diagnosis Date  . Anemia   . HIV infection (HCC)   . Hyperlipidemia   . Hypertension   . Neuropathy     Past Surgical History:  Procedure Laterality Date  . HERNIA REPAIR     Family History:  Family History  Problem Relation Age of Onset  . Heart attack Mother   . Stroke Mother   . Hyperlipidemia Mother   . Cancer Father        Leukemia   . Hyperlipidemia Maternal Grandmother   . Alzheimer's disease Maternal Grandmother   . Heart attack Maternal Grandfather    Family Psychiatric  History:  Social History:  Social History   Substance and Sexual Activity  Alcohol Use Not Currently   Comment: social     Social History   Substance and Sexual Activity  Drug Use No    Social History   Socioeconomic History  . Marital status: Legally Separated    Spouse name: Not on file  . Number of children: 4  . Years of education: Not on file  . Highest education level: Not on file  Occupational History  . 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 Smoking status: Never Smoker  . Smokeless tobacco: Never Used  Vaping Use  . Vaping Use: Never used  Substance and Sexual Activity  . Alcohol use: Not Currently    Comment: social  . Drug use: No  . Sexual activity: Not Currently    Partners: Male    Birth control/protection: Condom    Comment: declined condoms 08/10/20  Other Topics Concern  . Not on file  Social History Narrative  . Not on file   Social  Determinants of Health   Financial Resource Strain: Not on file  Food Insecurity: Not on file  Transportation Needs: Not on file  Physical Activity: Not on file  Stress: Not on file  Social Connections: Not on file   Additional Social History:    Allergies:  No Known Allergies  Labs:  Results for orders placed or performed during the hospital encounter of 11/16/20 (from the past 48 hour(s))  Basic metabolic panel     Status: Abnormal   Collection  Time: 11/19/20  4:58 AM  Result Value Ref Range   Sodium 133 (L) 135 - 145 mmol/L   Potassium 5.4 (H) 3.5 - 5.1 mmol/L   Chloride 102 98 - 111 mmol/L   CO2 23 22 - 32 mmol/L   Glucose, Bld 96 70 - 99 mg/dL    Comment: Glucose reference range applies only to samples taken after fasting for at least 8 hours.   BUN 27 (H) 6 - 20 mg/dL   Creatinine, Ser 7.06 (H) 0.44 - 1.00 mg/dL   Calcium 8.2 (L) 8.9 - 10.3 mg/dL   GFR, Estimated 55 (L) >60 mL/min    Comment: (NOTE) Calculated using the CKD-EPI Creatinine Equation (2021)    Anion gap 8 5 - 15    Comment: Performed at [facility], [address] [place], [place] [address]  CBC     Status: Abnormal   Collection Time: 11/19/20  4:58 AM  Result Value Ref Range   WBC 7.2 4.0 - 10.5 K/uL   RBC 2.53 (L) 3.87 - 5.11 MIL/uL   Hemoglobin 8.0 (L) 12.0 - 15.0 g/dL   HCT 83.1 (L) 51.7 - 61.6 %   MCV 99.2 80.0 - 100.0 fL   MCH 31.6 26.0 - 34.0 pg   MCHC 31.9 30.0 - 36.0 g/dL   RDW 07.3 (H) 71.0 - 62.6 %   Platelets PLATELET CLUMPS NOTED ON SMEAR, UNABLE TO ESTIMATE 150 - 400 K/uL    Comment: PLATELET CLUMPING, SUGGEST RECOLLECTION OF SAMPLE IN CITRATE TUBE. Immature Platelet Fraction may be clinically indicated, consider ordering this additional test [id number]    nRBC 0.0 0.0 - 0.2 %    Comment: Performed at [facility], [address] [place], [place] [address]  Magnesium     Status: None   Collection Time: 11/19/20  4:58 AM  Result Value Ref Range   Magnesium 1.8 1.7 - 2.4 mg/dL    Comment: Performed at [facility], [address], [place], [place] [address]  Basic metabolic panel     Status: Abnormal   Collection Time: 11/20/20  1:13 AM  Result Value Ref Range   Sodium 133 (L) 135 - 145 mmol/L   Potassium 5.5 (H) 3.5 - 5.1 mmol/L   Chloride 102 98 - 111 mmol/L   CO2 26 22 - 32 mmol/L   Glucose, Bld 106 (H) 70 - 99 mg/dL    Comment: Glucose reference range applies only to samples taken after fasting for at  least 8 hours.   BUN 26 (H) 6 - 20 mg/dL   Creatinine, Ser 2.99 (H) 0.44 - 1.00 mg/dL   Calcium 8.2 (L) 8.9 - 10.3 mg/dL   GFR, Estimated 54 (L) >60 mL/min    Comment: (NOTE) Calculated using the CKD-EPI Creatinine Equation (2021)    Anion gap 5 5 - 15    Comment: Performed at [facility], [address], [place], [place] [address]  CBC  Status: Abnormal   Collection Time: 11/20/20  1:13 AM  Result Value Ref Range   WBC 6.8 4.0 - 10.5 K/uL   RBC 2.54 (L) 3.87 - 5.11 MIL/uL   Hemoglobin 7.8 (L) 12.0 - 15.0 g/dL   HCT 82.5 (L) 05.3 - 97.6 %   MCV 100.0 80.0 - 100.0 fL   MCH 30.7 26.0 - 34.0 pg   MCHC 30.7 30.0 - 36.0 g/dL   RDW 73.4 (H) 19.3 - 79.0 %   Platelets 69 (L) 150 - 400 K/uL    Comment: REPEATED TO VERIFY PLATELET COUNT CONFIRMED BY SMEAR SPECIMEN CHECKED FOR CLOTS Immature Platelet Fraction may be clinically indicated, consider ordering this additional test [id number]    nRBC 0.0 0.0 - 0.2 %    Comment: Performed at [facility], [address], [place], [place] [address]  Magnesium     Status: None   Collection Time: 11/20/20  1:13 AM  Result Value Ref Range   Magnesium 1.9 1.7 - 2.4 mg/dL    Comment: Performed at [facility], [address], [place], [place] [address]    Medications:  Current Facility-Administered Medications  Medication Dose Route Frequency Provider Last Rate Last Admin  . 0.9 %  sodium chloride infusion   Intravenous Continuous Westley Chandler, MD 75 mL/hr at 11/20/20 0825 New Bag at 11/20/20 0825  . abacavir-dolutegravir-lamiVUDine (TRIUMEQ) 600-50-300 MG per tablet 1 tablet  1 tablet Oral Daily Jonah Blue, MD   1 tablet at 11/20/20 0826  . acetaminophen (TYLENOL) tablet 650 mg  650 mg Oral Q6H PRN Jonah Blue, MD   650 mg at 11/17/20 1801   Or  . acetaminophen (TYLENOL) suppository 650 mg  650 mg Rectal Q6H PRN Jonah Blue, MD      . amitriptyline (ELAVIL) tablet 25 mg  25 mg Oral QHS PRN Pieter Partridge, MD   25 mg at 11/19/20 1303  . bisacodyl (DULCOLAX) EC tablet 5 mg  5 mg Oral Daily PRN Judyann Munson, MD   5 mg at 11/20/20 0830  . citalopram (CELEXA) tablet 10 mg  10 mg Oral Daily Khole Arterburn, MD      . dextromethorphan-guaiFENesin (MUCINEX DM) 30-600 MG per 12 hr tablet 1 tablet  1 tablet Oral BID PRN Dimple Nanas, MD   1 tablet at 11/18/20 0919  . docusate (COLACE) 50 MG/5ML liquid 100 mg  100 mg Oral BID PRN Judyann Munson, MD      . feeding supplement (ENSURE ENLIVE / ENSURE PLUS) liquid 237 mL  237 mL Oral TID BM Amin, Ankit Chirag, MD   237 mL at 11/20/20 0827  . fluconazole (DIFLUCAN) tablet 200 mg  200 mg Oral Daily Judyann Munson, MD   200 mg at 11/20/20 0826  . heparin injection 5,000 Units  5,000 Units Subcutaneous Bobette Mo, MD   5,000 Units at 11/19/20 2039  . ipratropium-albuterol (DUONEB) 0.5-2.5 (3) MG/3ML nebulizer solution 3 mL  3 mL Nebulization Q4H PRN Amin, Ankit Chirag, MD   3 mL at 11/19/20 1101  . lactulose (CHRONULAC) 10 GM/15ML solution 10 g  10 g Oral BID PRN Pieter Partridge, MD   10 g at 11/20/20 0830  . multivitamin with minerals tablet 1 tablet  1 tablet Oral Daily Dimple Nanas, MD   1 tablet at 11/20/20 0825  . ondansetron (ZOFRAN) tablet 4 mg  4 mg Oral Q6H PRN Jonah Blue, MD  Or  . ondansetron (ZOFRAN) injection 4 mg  4 mg Intravenous Q6H PRN Jonah Blue, MD      . oxyCODONE (Oxy IR/ROXICODONE) immediate release tablet 5 mg  5 mg Oral Q4H PRN Jonah Blue, MD   5 mg at 11/19/20 2038  . predniSONE (DELTASONE) tablet 40 mg  40 mg Oral BID Judyann Munson, MD   40 mg at 11/20/20 0826  . sodium chloride flush (NS) 0.9 % injection 3 mL  3 mL Intravenous Q12H Jonah Blue, MD   3 mL at 11/19/20 2041  . sulfamethoxazole-trimethoprim (BACTRIM DS) 800-160 MG per tablet 1 tablet  1 tablet Oral Q12H Judyann Munson, MD   1 tablet at 11/20/20 4098  . valGANciclovir (VALCYTE) 450 MG tablet TABS 450  mg  450 mg Oral Once per day on Mon Thu Yates, Jennifer, MD   450 mg at 11/18/20 1551    Musculoskeletal: Strength & Muscle Tone: Not tested Gait & Station: not tested Patient leans: N/A  Psychiatric Specialty Exam: Physical Exam Psychiatric:        Attention and Perception: Attention and perception normal.        Mood and Affect: Mood is anxious and depressed.        Speech: Speech normal.        Behavior: Behavior normal. Behavior is cooperative.        Thought Content: Thought content normal.        Cognition and Memory: Cognition normal.        Judgment: Judgment normal.     Review of Systems  Constitutional: Positive for appetite change and fatigue.  HENT: Negative.   Eyes: Negative.   Respiratory: Positive for shortness of breath.   Cardiovascular: Negative.   Gastrointestinal: Negative.   Psychiatric/Behavioral: Positive for dysphoric mood. The patient is nervous/anxious.     Blood pressure (!) 125/100, pulse (!) 113, temperature (!) 97.5 F (36.4 C), temperature source Axillary, resp. rate (!) 24, height 5\' 1"  (1.549 m), weight 34 kg, SpO2 93 %.Body mass index is 14.17 kg/m.  General Appearance: Casual  Eye Contact:  Good  Speech:  Clear and Coherent  Volume:  Decreased  Mood:  Dysphoric  Affect:  Constricted  Thought Process:  Coherent  Orientation:  Full (Time, Place, and Person)  Thought Content:  Logical  Suicidal Thoughts:  No  Homicidal Thoughts:  No  Memory:  Immediate;   Fair Recent;   Fair Remote;   Fair  Judgement:  Intact  Insight:  Fair  Psychomotor Activity:  Psychomotor Retardation  Concentration:  Concentration: Fair and Attention Span: Fair  Recall:  Good  Fund of Knowledge:  Good  Language:  Fair  Akathisia:  No  Handed:  Right  AIMS (if indicated):     Assets:  Communication Skills Desire for Improvement  ADL's:  Intact  Cognition:  WNL  Sleep:   fair     Treatment Plan Summary: 50 year old female with history of Depression  who was admitted for acute hypoxia from multifocal pneumonia. She developed severe anxiety and reports occasional depressive symptoms. However, she denies psychosis, delusions and self harming thoughts.   Recommendations: -Consider Citalopram 10 mg daily for anxiety/depression. -Patient will continue counseling at Infectious disease center upon discharge.  Disposition: No evidence of imminent risk to self or others at present.   Patient does not meet criteria for psychiatric inpatient admission. Supportive therapy provided about ongoing stressors. Psychiatric service signing out. Re-consult as needed  This service was provided via telemedicine  using a 2-way, interactive audio and Immunologistvideo technology.  Names of all persons participating in this telemedicine service and their role in this encounter. Name: Lavone NianCarol Courington Role: Patient  Name: Thedore MinsMojeed Janell Keeling, MD  Role: Psychiatrist    Thedore MinsMojeed Pollyann Roa, MD 11/20/2020 12:05 PM

## 2020-11-20 NOTE — Consult Note (Signed)
Consultation Note Date: 11/20/2020   Patient Name: Krystal Bowen  DOB: 05-08-1971  MRN: [id number]  Age / Sex: 50 y.o., female  PCP: Damaris Hippo, MD (Inactive) Referring Physician: Martyn Malay, MD  Reason for Consultation: Establishing goals of care  HPI/Patient Profile: 50 y.o. female  with past medical history of HTN, HLD, retinal detachment with CMV retinitis, severe depression, HIV/AIDS admitted on 11/16/2020 with a fall. Found to have acute hypoxia from multifocal pneumonia - thought to be severe PJP pna. Also found to have oral candidiasis - on daily fluconazole for 3 weeks. [name] also has a history of not taking her medications as prescribed. [name] has had difficulty with desaturation 1/29 with oxygen requirements up to 15 L. Patient has also been bothered by anxiety and panic attacks. Seen by psych - citalopram daily recommended. PMT consulted to assist with symptom management.  Clinical Assessment and Goals of Care: I have reviewed medical records including EPIC notes, labs and imaging, received report from RN, assessed the patient and then met with patient to discuss diagnosis prognosis, GOC, EOL wishes, disposition and options.  I introduced Palliative Medicine as specialized medical care for people living with serious illness. It focuses on providing relief from the symptoms and stress of a serious illness. The goal is to improve quality of life for both the patient and the family.  Patient shares with me she is very tired, not interested in talking, only wants to take a nap.   We discuss my role to help with her symptoms. She confirms struggling with anxiety - tells me today is better than yesterday. Tells me shortness of breath worsens anxiety.   We discuss use of low dose roxanol as needed to relieve shortness of breath. She is agreeable to this. Tells me she has not taken much pain medicine in the past, we discuss low  dose and will only be given at her request. She agrees to this plan.   Questions and concerns were addressed.  Discussed plan with nurse - nurse shares concern of potential need for family meeting as family does not seem to understand situation. We discuss that we can discuss this as an option with patient when she wishes to engage in conversation more.   Primary Decision Maker PATIENT    SUMMARY OF RECOMMENDATIONS   - initiate roxanol 5 mg sublingual q3hr PRN fir shortness of breath - will follow - nurse requests potential family meeting - will need to address with patient  - thrush addressed by ID  Code Status/Advance Care Planning:  DNR  Prognosis:   Unable to determine  Discharge Planning: To Be Determined      Primary Diagnoses: Present on Admission: . (Resolved) Acute respiratory failure with hypoxia ([place]) . Sepsis due to undetermined organism with acute respiratory failure ([place]) . HIV disease ([place]) . HTN (hypertension) . Dyslipidemia . Depression . CMV retinitis ([place]) . Unspecified severe protein-calorie malnutrition ([place]) . Thrush   I have reviewed the medical record, interviewed the patient and family, and examined the patient. The following aspects are pertinent.  Past Medical History:  Diagnosis Date  . Anemia   . HIV infection ([place])   . Hyperlipidemia   . Hypertension   . Neuropathy    Social History   Socioeconomic History  . Marital status: Legally Separated    Spouse name: Not on file  . Number of children: 4  . Years of education: Not on file  . Highest education level: Not  on file  Occupational History  . 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 Smoking status: Never Smoker  . Smokeless tobacco: Never Used  Vaping Use  . Vaping Use: Never used  Substance and Sexual Activity  . Alcohol use: Not Currently    Comment: social  . Drug use: No  . Sexual activity: Not Currently     Partners: Male    Birth control/protection: Condom    Comment: declined condoms 08/10/20  Other Topics Concern  . Not on file  Social History Narrative  . Not on file   Social Determinants of Health   Financial Resource Strain: Not on file  Food Insecurity: Not on file  Transportation Needs: Not on file  Physical Activity: Not on file  Stress: Not on file  Social Connections: Not on file   Family History  Problem Relation Age of Onset  . Heart attack Mother   . Stroke Mother   . Hyperlipidemia Mother   . Cancer Father        Leukemia   . Hyperlipidemia Maternal Grandmother   . Alzheimer's disease Maternal Grandmother   . Heart attack Maternal Grandfather    Scheduled Meds: . azithromycin  1,200 mg Oral Q Sat  . [START ON 11/21/2020] darunavir-cobicistat  1 tablet Oral Q breakfast  . dolutegravir  50 mg Oral Daily  . feeding supplement  237 mL Oral TID BM  . fluconazole  200 mg Oral Daily  . heparin injection (subcutaneous)  5,000 Units Subcutaneous Q8H  . mirtazapine  7.5 mg Oral QHS  . multivitamin with minerals  1 tablet Oral Daily  . predniSONE  40 mg Oral BID  . sodium chloride flush  3 mL Intravenous Q12H  . sulfamethoxazole-trimethoprim  1 tablet Oral Q12H  . valGANciclovir  450 mg Oral Q48H   Continuous Infusions: . sodium chloride 75 mL/hr at 11/20/20 0825   PRN Meds:.acetaminophen **OR** acetaminophen, bisacodyl, dextromethorphan-guaiFENesin, docusate, ipratropium-albuterol, lactulose, morphine CONCENTRATE, ondansetron **OR** ondansetron (ZOFRAN) IV, oxyCODONE No Known Allergies Review of Systems  Constitutional: Positive for activity change, appetite change and fatigue.  Respiratory: Positive for shortness of breath.   Neurological: Positive for weakness.  Psychiatric/Behavioral: The patient is nervous/anxious.     Physical Exam Constitutional:      Comments: Alert but appears weak and tired  Pulmonary:     Effort: Pulmonary effort is normal.      Comments: Saturations in 80s on 4L [place], increased to 6L Skin:    General: Skin is warm and dry.  Neurological:     Mental Status: She is oriented to person, place, and time.  Psychiatric:        Behavior: Behavior is withdrawn.     Vital Signs: BP (!) 125/100 (BP Location: Right Arm)   Pulse (!) 113   Temp (!) 97.5 F (36.4 C) (Axillary)   Resp (!) 24   Ht '5\' 1"'  (1.549 m)   Wt 34 kg   SpO2 96%   BMI 14.17 kg/m  Pain Scale: 0-10   Pain Score: 4    SpO2: SpO2: 96 % O2 Device:SpO2: 96 % O2 Flow Rate: .O2 Flow Rate (L/min): 4 L/min  IO: Intake/output summary:   Intake/Output Summary (Last 24 hours) at 11/20/2020 1606 Last data filed at 11/20/2020 0825 Gross per 24 hour  Intake -  Output 1625 ml  Net -1625 ml    LBM: Last BM Date: 11/14/20 Baseline  Weight: Weight: 34 kg Most recent weight: Weight: 34 kg     Palliative Assessment/Data: PPS 40%    Time Total: 35 minutes Greater than 50%  of this time was spent counseling and coordinating care related to the above assessment and plan.  Juel Burrow, DNP, AGNP-C Palliative Medicine Team [phone] Pager: [phone]

## 2020-11-20 NOTE — Progress Notes (Signed)
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  Subjective:  Called to patient's room for oxygen desaturation while off of oxygen, now requiring 15L.   At bedside, patient reports she is bothered by nasal cannula, beeping, and worries about breathing. No chest pain or uncontrolled pain. Reports a history of intermittent anxiety. Able to be weaned easily to 8 L oxygen.    Objective: Vitals:   11/19/20 2000 11/19/20 2219 11/20/20 0327 11/20/20 0838  BP:  (!) 128/95 124/89 (!) 125/100  Pulse:  72 65 (!) 113  Resp: (!) 22 19 19  (!) 24  Temp:  98.5 F (36.9 C) 98 F (36.7 C) (!) 97.5 F (36.4 C)  TempSrc:  Oral Oral Axillary  SpO2:  100% 99% 93%  Weight:      Height:        Intake/Output Summary (Last 24 hours) at 11/20/2020 1242 Last data filed at 11/20/2020 0825 Gross per 24 hour  Intake -  Output 1625 ml  Net -1625 ml   Filed Weights   11/16/20 1900  Weight: 34 kg     Exam:  General: Thin emaciated female lying in bed.   Eyes: sclera anicteric, conjuctiva mild injection bilaterally CVS: S1-S2, regular  Respiratory:  decreased air entry bilaterally secondary to decreased inspiratory effort, rales at bases  GI: NABS, soft, NT  LE: No edema.  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  Acute Hypoxic Respiratory Failure Due to PJP Pneumonia, presumed. - PJP smear sent - Cultures negative - Appreciate ID consultation and care  - Continue prednisone and TMP SMX   Oral Thrush -  Fluconazole 200 mg (1/25-2/15) per ID- 21 days    Situational anxiety, suspect component of underlying GAD - Psychiatry consulted yesterday, patient and nurse requesting palliative care consult for symptoms which is reasonable - Dr. Jannifer Franklin started citalopram--appreciate consult and care  - Ativan only if verbal de-escalation and discussion with patient does not improve symptoms.  - EKG for QTC   Macrocytic anemia - Declined from 11 to 8.5 and now 7.8 - Send B12, folate, smear (? Hemolysis) - Monitor for bleeding  Thrombocytopenia, question if due to Bactrim, HIT (low likelihood), underlying myelodysplasia?  - Platelets 69,000, discontinue subq Heparin if <50,000 - Monitor for bleeding  - HIT score is 2, low likelihood HIT  Hyperkalemia - EKG, likely due to Bactrim and LR - Changed to NS  - Monitor   AKI Resolved   Mechanical fall due to weakness Appreciate PT and OT evaluation  HTN Hold HCTZ as patient was thought to be dehydrated Diastolics are elevated, systolics are low normal  HIV disease On Triumeq per ID Will clarify if needs azithromycin prophylaxis-- not ordered during this stay so far  Continue valganciclovir per home doses  DVT prophylaxis: Heparin subcu Code Status: DNR Family Communication: None Disposition Plan:   Patient is from: Home  Anticipated Discharge Location: TBD  Barriers to Discharge: Ongoing treatment for P JP  Is patient medically stable for Discharge: No   Consultants:  Infectious disease  [facility]  Palliative   Procedures:  None  Antimicrobials:  IV Bactrim  Home valganciclovir  Data Reviewed:  Basic Metabolic Panel: Recent Labs  Lab 11/17/20 0532 11/17/20 2200 11/18/20 0439 11/19/20 0458 11/20/20 0113  NA 137 136 134* 133* 133*  K 5.2* 5.5* 4.7 5.4* 5.5*  CL 107 105 104 102 102  CO2 22 20* 23 23 26   GLUCOSE 118* 123* 135* 96 106*  BUN 37* 39* 35* 27* 26*  CREATININE 1.34* 1.52* 1.47* 1.20* 1.22*  CALCIUM 7.9*  8.0* 8.0* 8.2* 8.2*  MG  --   --  1.8 1.8 1.9   Liver Function Tests: Recent Labs  Lab 11/16/20 1148  AST 33  ALT 14  ALKPHOS 82  BILITOT 0.4  PROT 7.6  ALBUMIN 1.9*   No results for input(s): LIPASE, AMYLASE in the last 168 hours. No results for input(s): AMMONIA in the last 168 hours. CBC: Recent Labs  Lab 11/16/20 1148 11/16/20 1158 11/17/20 0532 11/18/20 0439 11/19/20 0458 11/20/20 0113  WBC 9.2  --  4.8 10.0 7.2 6.8  NEUTROABS 7.7  --   --   --   --   --   HGB 11.4* 11.9* 8.5* 8.5* 8.0* 7.8*  HCT 38.6 35.0* 27.8* 27.3* 25.1* 25.4*  MCV 103.8*  --  103.7* 100.7* 99.2 100.0  PLT PLATELET CLUMPS NOTED ON SMEAR, UNABLE TO ESTIMATE  --  PLATELET CLUMPS NOTED ON SMEAR, UNABLE TO ESTIMATE PLATELET CLUMPS NOTED ON SMEAR, UNABLE TO ESTIMATE PLATELET CLUMPS NOTED ON SMEAR, UNABLE TO ESTIMATE 69*   Cardiac Enzymes: No results for input(s): CKTOTAL, CKMB, CKMBINDEX, TROPONINI in the last 168 hours. BNP (last 3 results) No results for input(s): PROBNP in the last 8760 hours. CBG: Recent Labs  Lab 11/17/20 1147  GLUCAP 137*    Recent Results (from the past 240 hour(s))  SARS Coronavirus 2 by RT PCR (hospital order, performed in [facility] hospital lab) Nasopharyngeal Nasopharyngeal Swab     Status: None   Collection Time: 11/16/20 10:06 AM   Specimen: Nasopharyngeal Swab  Result Value Ref Range Status   SARS Coronavirus 2 NEGATIVE NEGATIVE Final    Comment: (NOTE) SARS-CoV-2 target nucleic acids are NOT DETECTED.  The SARS-CoV-2 RNA is generally detectable in upper and lower respiratory specimens during the acute phase of infection. The lowest concentration of SARS-CoV-2 viral copies this assay can detect is 250 copies / mL. A negative result does not preclude SARS-CoV-2 infection and should not be used as the sole basis for treatment or other patient management decisions.  A negative result may occur with improper specimen collection / handling, submission of  specimen other than nasopharyngeal swab, presence of viral mutation(s) within the areas targeted by this assay, and inadequate number of viral copies (<250 copies / mL). A negative result must be combined with clinical observations, patient history, and epidemiological information.  Fact Sheet for Patients:   BoilerBrush.com.cy  Fact Sheet for Healthcare Providers: https://pope.com/  This test is not yet approved or  cleared by the [place] FDA and has been authorized for detection and/or diagnosis of SARS-CoV-2 by FDA under an Emergency Use Authorization (EUA).  This EUA will remain in effect (meaning this test can be used) for the duration of the COVID-19 declaration under Section 564(b)(1) of the Act, 21 U.S.C. section 360bbb-3(b)(1), unless the authorization is terminated or revoked sooner.  Performed at [facility], [address], [place], [place] [address]   Culture, blood (  Routine X 2) w Reflex to ID Panel     Status: None (Preliminary result)   Collection Time: 11/17/20 11:11 AM   Specimen: BLOOD RIGHT ARM  Result Value Ref Range Status   Specimen Description BLOOD RIGHT ARM  Final   Special Requests   Final    BOTTLES DRAWN AEROBIC ONLY Blood Culture results may not be optimal due to an inadequate volume of blood received in culture bottles   Culture   Final    NO GROWTH 2 DAYS Performed at [facility], [address], [place], [place] [address]    Report Status PENDING  Incomplete  Culture, blood (Routine X 2) w Reflex to ID Panel     Status: None (Preliminary result)   Collection Time: 11/17/20 11:11 AM   Specimen: BLOOD LEFT HAND  Result Value Ref Range Status   Specimen Description BLOOD LEFT HAND  Final   Special Requests   Final    BOTTLES DRAWN AEROBIC AND ANAEROBIC Blood Culture results may not be optimal due to an inadequate volume of blood received in culture bottles   Culture   Final     NO GROWTH 2 DAYS Performed at [facility], [address] [place], [place] [address]    Report Status PENDING  Incomplete      Studies: No results found.   Scheduled Meds: . abacavir-dolutegravir-lamiVUDine  1 tablet Oral Daily  . citalopram  10 mg Oral Daily  . feeding supplement  237 mL Oral TID BM  . fluconazole  200 mg Oral Daily  . heparin injection (subcutaneous)  5,000 Units Subcutaneous Q8H  . multivitamin with minerals  1 tablet Oral Daily  . predniSONE  40 mg Oral BID  . sodium chloride flush  3 mL Intravenous Q12H  . sulfamethoxazole-trimethoprim  1 tablet Oral Q12H  . valGANciclovir  450 mg Oral Once per day on Mon Thu   Continuous Infusions: . sodium chloride 75 mL/hr at 11/20/20 0825    Principal Problem:   Sepsis due to undetermined organism with acute respiratory failure (HCC) Active Problems:   HIV disease (HCC)   HTN (hypertension)   Dyslipidemia   Depression   Noncompliance w/medication treatment due to intermit use of medication   Thrush   CMV retinitis (HCC)   Unspecified severe protein-calorie malnutrition (HCC)   Protein-calorie malnutrition, severe   Adjustment disorder with mixed anxiety and depressed mood     Broady Lafoy Bartholome Bill, [facility]  If 7PM-7AM, please contact night-coverage [email or web address] Password [email or web address] 11/20/2020, 12:42 PM    LOS: 4 days

## 2020-11-20 NOTE — Progress Notes (Signed)
[facility] Brief Note   Called microbiology re: PJP smear. This was collected, sent, then cancelled during weekday. These are sent to [facility]. New order placed, messaged RN to collect.  Terisa Starr, MD  Family Medicine Teaching Service

## 2020-11-21 ENCOUNTER — Encounter (HOSPITAL_COMMUNITY): Payer: Self-pay | Admitting: Internal Medicine

## 2020-11-21 DIAGNOSIS — F4323 Adjustment disorder with mixed anxiety and depressed mood: Secondary | ICD-10-CM

## 2020-11-21 LAB — RENAL FUNCTION PANEL
Albumin: 1.5 g/dL — ABNORMAL LOW (ref 3.5–5.0)
Anion gap: 8 (ref 5–15)
BUN: 22 mg/dL — ABNORMAL HIGH (ref 6–20)
CO2: 24 mmol/L (ref 22–32)
Calcium: 8.1 mg/dL — ABNORMAL LOW (ref 8.9–10.3)
Chloride: 103 mmol/L (ref 98–111)
Creatinine, Ser: 1.14 mg/dL — ABNORMAL HIGH (ref 0.44–1.00)
GFR, Estimated: 59 mL/min — ABNORMAL LOW (ref >60)
Glucose, Bld: 124 mg/dL — ABNORMAL HIGH (ref 70–99)
Phosphorus: 2.9 mg/dL (ref 2.5–4.6)
Potassium: 5 mmol/L (ref 3.5–5.1)
Sodium: 135 mmol/L (ref 135–145)

## 2020-11-21 LAB — SAVE SMEAR(SSMR), FOR PROVIDER SLIDE REVIEW

## 2020-11-21 LAB — CBC WITH DIFFERENTIAL/PLATELET
Abs Immature Granulocytes: 0.17 10*3/uL — ABNORMAL HIGH (ref 0.00–0.07)
Basophils Absolute: 0 10*3/uL (ref 0.0–0.1)
Basophils Relative: 0 %
Eosinophils Absolute: 0 10*3/uL (ref 0.0–0.5)
Eosinophils Relative: 0 %
HCT: 26.7 % — ABNORMAL LOW (ref 36.0–46.0)
Hemoglobin: 8.2 g/dL — ABNORMAL LOW (ref 12.0–15.0)
Immature Granulocytes: 2 %
Lymphocytes Relative: 1 %
Lymphs Abs: 0 10*3/uL — ABNORMAL LOW (ref 0.7–4.0)
MCH: 30.8 pg (ref 26.0–34.0)
MCHC: 30.7 g/dL (ref 30.0–36.0)
MCV: 100.4 fL — ABNORMAL HIGH (ref 80.0–100.0)
Monocytes Absolute: 0.3 10*3/uL (ref 0.1–1.0)
Monocytes Relative: 3 %
Neutro Abs: 7.5 10*3/uL (ref 1.7–7.7)
Neutrophils Relative %: 94 %
Platelets: 73 10*3/uL — ABNORMAL LOW (ref 150–400)
RBC: 2.66 MIL/uL — ABNORMAL LOW (ref 3.87–5.11)
RDW: 16.4 % — ABNORMAL HIGH (ref 11.5–15.5)
WBC: 8 10*3/uL (ref 4.0–10.5)
nRBC: 0 % (ref 0.0–0.2)

## 2020-11-21 LAB — FOLATE: Folate: 4.8 ng/mL — ABNORMAL LOW (ref >5.9)

## 2020-11-21 LAB — MAGNESIUM: Magnesium: 1.7 mg/dL (ref 1.7–2.4)

## 2020-11-21 LAB — VITAMIN B12: Vitamin B-12: 300 pg/mL (ref 180–914)

## 2020-11-21 NOTE — Progress Notes (Signed)
Pt c/o just wanting to rest "I/m tired" during shift assessment. Pt also requested meds crushed in applesauce. Pt ultimately not able to tolerate consuming all of her night meds in applesauce, appears to be b/c of a lack of appetite.

## 2020-11-21 NOTE — Plan of Care (Signed)

## 2020-11-21 NOTE — Progress Notes (Signed)
HOSPITAL MEDICINE OVERNIGHT EVENT NOTE    Notified by nursing the patient is exhibiting a yellow MEWS score.  Chart reviewed, patient is currently hospitalized for multifocal pneumocystis jirovecii  Pneumonia currently on Bactrim.  Per my discussion with nursing, patient has exhibited no significant change in symptoms as of late with the exception of some mild anxiety.  Patient exhibits no evidence of fever.  Patient is hemodynamically stable.  Patient currently has no complaints.  Continue current plan of care, continue to monitor closely.  Krystal Elk  MD [facility]

## 2020-11-21 NOTE — Progress Notes (Signed)
Palliative:  HPI: 50 y.o. female  with past medical history of HTN, HLD, retinal detachment with CMV retinitis, severe depression, HIV/AIDS admitted on 11/16/2020 with a fall. Found to have acute hypoxia from multifocal pneumonia - thought to be severe PJP pna. Also found to have oral candidiasis - on daily fluconazole for 3 weeks. Krystal Bowen also has a history of not taking her medications as prescribed. Krystal Bowen has had difficulty with desaturation 1/29 with oxygen requirements up to 15 L. Patient has also been bothered by anxiety and panic attacks. Seen by psych - citalopram daily recommended. PMT consulted to assist with symptom management.   I met today with Krystal Bowen. Her IV was beeping and I assisted her to reposition her arm to keep antecubital open so IV does not occlude and alarm. She is resting comfortably. She does share that her anxiety seems better today and she appears relaxed currently. She received mirtazapine last night but no other as needed medications. She reports that she had a bad night last night and I questioned her about what made it bad for her and if this was related to her anxiety. She does not really have a clear answer but does tell me "I'm just tired of all this" but when I questioned her further about what this means and what she is trying to say then she tells me "I'm just so tired and want to nap." I told her that I will let her get back to sleep for now but that I want to come back and speak with her more tomorrow. She says that will be fine.   All questions/concerns addressed. Emotional support provided.   Exam: Alert, oriented x 3. Cachectic. Generalized fatigue and weakness. Appears restful and without anxiety. Breathing not labored but on 11L HFNC. Abd flat.   Plan: - Continue mirtazapine.  - I would also consider low dose Zyprexa which could help with sleep, depression/anxiety, appetite stimulation.  - I will follow up tomorrow.   15 min  Vinie Sill,  NP Palliative Medicine Team Pager [phone] (Please see [email or web address] for schedule) Team Phone [phone]    Greater than 50%  of this time was spent counseling and coordinating care related to the above assessment and plan

## 2020-11-21 NOTE — Progress Notes (Signed)
Progress Note    Krystal Bowen  ZOX:[id number] DOB: [birth year]  DOA: 11/16/2020 PCP: Hoyt Koch, MD (Inactive)      Brief Narrative:    Medical records reviewed and are as summarized below:  Krystal Bowen is a 50 y.o. female history of HTN, HLD, retinal detachment with CMV retinitis, severe depression, HIV/AIDS admitted for acute hypoxia from multifocal pneumoniaon 4 L nasal cannula thought to be secondary to likely PJP pneumonia, currently on bactrim DS and steroids.       Assessment/Plan:   Principal Problem:   Sepsis due to undetermined organism with acute respiratory failure (HCC) Active Problems:   HIV disease (HCC)   HTN (hypertension)   Dyslipidemia   Depression   Noncompliance w/medication treatment due to intermit use of medication   AIDS (acquired immune deficiency syndrome) (HCC)   Thrush   CMV retinitis (HCC)   Unspecified severe protein-calorie malnutrition (HCC)   Protein-calorie malnutrition, severe   Adjustment disorder with mixed anxiety and depressed mood   Nutrition Problem: Severe Malnutrition Etiology: chronic illness (HIV/AIDS)  Signs/Symptoms: severe fat depletion,severe muscle depletion   Body mass index is 14.17 kg/m. (Underweight)    Acute Hypoxic Respiratory Failure Due to PJP Pneumonia, presumed. She is on 11 L/min via high flow nasal cannula. Taper down oxygen as able. - PJP smear sent - Cultures negative - Appreciate ID consultation and care  -Continue prednisone and Bactrim  Oral Thrush - Fluconazole 200 mg (1/25-2/15) per ID- 21 days    Situational anxiety, suspect component of underlying GAD - Dr. Jannifer Franklin, psychiatrist, started citalopram--appreciate consult and care  -Ativan as needed -Repeat EKG tomorrow  Macrocytic anemia -H&H is stable -Low folate (4.8), low normal vitamin B12 (300)  Thrombocytopenia, question if due to Bactrim, HIT (low likelihood),  -Platelets slowly improving. - HIT score is  2, low likelihood HIT  Hyperkalemia -Improving. Continue to monitor  AKI Resolved   Mechanical fall due to weakness Appreciate PT and OT evaluation  HTN Hold HCTZ as patient was thought to be dehydrated   HIV disease On Triumeq per ID Continue azithromycin for prophylaxis  Continue valganciclovir for prophylaxis         Diet Order            Diet regular Room service appropriate? Yes; Fluid consistency: Thin  Diet effective now                    Consultants:  Infectious disease  Behavioral health  Palliative care  Procedures:  None    Medications:   . azithromycin  1,200 mg Oral Q Sat  . darunavir-cobicistat  1 tablet Oral Q breakfast  . dolutegravir  50 mg Oral Daily  . feeding supplement  237 mL Oral TID BM  . fluconazole  200 mg Oral Daily  . heparin injection (subcutaneous)  5,000 Units Subcutaneous Q8H  . mirtazapine  7.5 mg Oral QHS  . multivitamin with minerals  1 tablet Oral Daily  . predniSONE  40 mg Oral BID  . sodium chloride flush  3 mL Intravenous Q12H  . sulfamethoxazole-trimethoprim  1 tablet Oral Q12H  . valGANciclovir  450 mg Oral Q48H   Continuous Infusions: . sodium chloride 75 mL/hr at 11/21/20 0313     Anti-infectives (From admission, onward)   Start     Dose/Rate Route Frequency Ordered Stop   11/21/20 0700  darunavir-cobicistat (PREZCOBIX) 800-150 MG per tablet 1 tablet  1 tablet Oral Daily with breakfast 11/20/20 1432     11/20/20 2200  valGANciclovir (VALCYTE) 450 MG tablet TABS 450 mg        450 mg Oral Every 48 hours 11/20/20 1430     11/20/20 1700  azithromycin (ZITHROMAX) tablet 1,200 mg        1,200 mg Oral Every Sat 11/20/20 1418     11/20/20 1530  dolutegravir (TIVICAY) tablet 50 mg        50 mg Oral Daily 11/20/20 1432     11/20/20 1430  azithromycin (ZITHROMAX) tablet 1,200 mg  Status:  Discontinued        1,200 mg Oral Every Sat 11/20/20 1416 11/20/20 1418   11/19/20 2200   sulfamethoxazole-trimethoprim (BACTRIM DS) 800-160 MG per tablet 1 tablet        1 tablet Oral Every 12 hours 11/19/20 1148     11/18/20 1000  fluconazole (DIFLUCAN) tablet 200 mg        200 mg Oral Daily 11/18/20 0712     11/18/20 0900  valGANciclovir (VALCYTE) 450 MG tablet TABS 450 mg  Status:  Discontinued        450 mg Oral Once per day on Mon Thu 11/16/20 2114 11/20/20 1430   11/16/20 2200  valGANciclovir (VALCYTE) 450 MG tablet TABS 450 mg  Status:  Discontinued        450 mg Oral 2 times daily 11/16/20 1736 11/16/20 2114   11/16/20 2000  sulfamethoxazole-trimethoprim (BACTRIM) 170.08 mg in dextrose 5 % 250 mL IVPB  Status:  Discontinued        5 mg/kg  34 kg 260.6 mL/hr over 60 Minutes Intravenous Every 12 hours 11/16/20 1743 11/19/20 1148   11/16/20 1930  abacavir-dolutegravir-lamiVUDine (TRIUMEQ) 600-50-300 MG per tablet 1 tablet  Status:  Discontinued        1 tablet Oral Daily 11/16/20 1736 11/20/20 1430   11/16/20 1930  fluconazole (DIFLUCAN) tablet 400 mg  Status:  Discontinued        400 mg Oral Daily 11/16/20 1743 11/18/20 0712   11/16/20 1745  sulfamethoxazole-trimethoprim (BACTRIM) 200-40 MG/5ML suspension 20 mL  Status:  Discontinued        20 mL Oral Daily 11/16/20 1736 11/16/20 1743   11/16/20 1745  ceFEPIme (MAXIPIME) 2 g in sodium chloride 0.9 % 100 mL IVPB  Status:  Discontinued        2 g 200 mL/hr over 30 Minutes Intravenous  Once 11/16/20 1736 11/16/20 1743   11/16/20 1745  metroNIDAZOLE (FLAGYL) IVPB 500 mg  Status:  Discontinued        500 mg 100 mL/hr over 60 Minutes Intravenous Every 8 hours 11/16/20 1736 11/16/20 1842   11/16/20 1745  vancomycin (VANCOCIN) IVPB 1000 mg/200 mL premix  Status:  Discontinued        1,000 mg 200 mL/hr over 60 Minutes Intravenous  Once 11/16/20 1736 11/16/20 1743             Family Communication/Anticipated D/C date and plan/Code Status   DVT prophylaxis: heparin injection 5,000 Units Start: 11/16/20 2200     Code  Status: DNR  Family Communication: None Disposition Plan:    Status is: Inpatient  Remains inpatient appropriate because:Inpatient level of care appropriate due to severity of illness   Dispo:  Patient From: Home  Planned Disposition: Home  Expected discharge date: 11/24/2020  Medically stable for discharge: No             Subjective:  C/o shortness of breath.  Objective:    Vitals:   11/21/20 0309 11/21/20 0420 11/21/20 0421 11/21/20 0739  BP: (!) 119/95  (!) 131/96 133/90  Pulse: 75  67 73  Resp: (!) 21 20 20 19   Temp: 98.7 F (37.1 C) 97.8 F (36.6 C) 97.8 F (36.6 C) 97.7 F (36.5 C)  TempSrc: Oral Axillary Axillary Axillary  SpO2: 100%  100% 100%  Weight:      Height:       No data found.   Intake/Output Summary (Last 24 hours) at 11/21/2020 1142 Last data filed at 11/21/2020 0315 Gross per 24 hour  Intake 968.79 ml  Output 1000 ml  Net -31.21 ml   Filed Weights   11/16/20 1900  Weight: 34 kg    Exam:  GEN: NAD, cachetic SKIN: Warm and dry EYES: EOMI ENT: MMM CV: RRR PULM: CTA B ABD: soft, ND, NT, +BS CNS: AAO x 3, non focal EXT: No edema or tenderness   Data Reviewed:   I have personally reviewed following labs and imaging studies:  Labs: Labs show the following:   Basic Metabolic Panel: Recent Labs  Lab 11/17/20 2200 11/18/20 0439 11/19/20 0458 11/20/20 0113 11/21/20 0037  NA 136 134* 133* 133* 135  K 5.5* 4.7 5.4* 5.5* 5.0  CL 105 104 102 102 103  CO2 20* 23 23 26 24   GLUCOSE 123* 135* 96 106* 124*  BUN 39* 35* 27* 26* 22*  CREATININE 1.52* 1.47* 1.20* 1.22* 1.14*  CALCIUM 8.0* 8.0* 8.2* 8.2* 8.1*  MG  --  1.8 1.8 1.9 1.7  PHOS  --   --   --   --  2.9   GFR Estimated Creatinine Clearance: 32 mL/min (A) (by C-G formula based on SCr of 1.14 mg/dL (H)). Liver Function Tests: Recent Labs  Lab 11/16/20 1148 11/21/20 0037  AST 33  --   ALT 14  --   ALKPHOS 82  --   BILITOT 0.4  --   PROT 7.6  --   ALBUMIN  1.9* 1.5*   No results for input(s): LIPASE, AMYLASE in the last 168 hours. No results for input(s): AMMONIA in the last 168 hours. Coagulation profile No results for input(s): INR, PROTIME in the last 168 hours.  CBC: Recent Labs  Lab 11/16/20 1148 11/16/20 1158 11/17/20 0532 11/18/20 0439 11/19/20 0458 11/20/20 0113 11/21/20 0037  WBC 9.2  --  4.8 10.0 7.2 6.8 8.0  NEUTROABS 7.7  --   --   --   --   --  7.5  HGB 11.4*   < > 8.5* 8.5* 8.0* 7.8* 8.2*  HCT 38.6   < > 27.8* 27.3* 25.1* 25.4* 26.7*  MCV 103.8*  --  103.7* 100.7* 99.2 100.0 100.4*  PLT PLATELET CLUMPS NOTED ON SMEAR, UNABLE TO ESTIMATE  --  PLATELET CLUMPS NOTED ON SMEAR, UNABLE TO ESTIMATE PLATELET CLUMPS NOTED ON SMEAR, UNABLE TO ESTIMATE PLATELET CLUMPS NOTED ON SMEAR, UNABLE TO ESTIMATE 69* 73*   < > = values in this interval not displayed.   Cardiac Enzymes: No results for input(s): CKTOTAL, CKMB, CKMBINDEX, TROPONINI in the last 168 hours. BNP (last 3 results) No results for input(s): PROBNP in the last 8760 hours. CBG: Recent Labs  Lab 11/17/20 1147  GLUCAP 137*   D-Dimer: No results for input(s): DDIMER in the last 72 hours. Hgb A1c: No results for input(s): HGBA1C in the last 72 hours. Lipid Profile: No results for input(s): CHOL, HDL, LDLCALC,  TRIG, CHOLHDL, LDLDIRECT in the last 72 hours. Thyroid function studies: No results for input(s): TSH, T4TOTAL, T3FREE, THYROIDAB in the last 72 hours.  Invalid input(s): FREET3 Anemia work up: Recent Labs    11/21/20 0037  VITAMINB12 300  FOLATE 4.8*   Sepsis Labs: Recent Labs  Lab 11/16/20 1148 11/16/20 1415 11/17/20 0532 11/18/20 0439 11/19/20 0458 11/20/20 0113 11/21/20 0037  PROCALCITON  --   --  0.32  --   --   --   --   WBC 9.2  --  4.8 10.0 7.2 6.8 8.0  LATICACIDVEN 3.0* 1.6  --   --   --   --   --     Microbiology Recent Results (from the past 240 hour(s))  SARS Coronavirus 2 by RT PCR (hospital order, performed in [facility]  hospital lab) Nasopharyngeal Nasopharyngeal Swab     Status: None   Collection Time: 11/16/20 10:06 AM   Specimen: Nasopharyngeal Swab  Result Value Ref Range Status   SARS Coronavirus 2 NEGATIVE NEGATIVE Final    Comment: (NOTE) SARS-CoV-2 target nucleic acids are NOT DETECTED.  The SARS-CoV-2 RNA is generally detectable in upper and lower respiratory specimens during the acute phase of infection. The lowest concentration of SARS-CoV-2 viral copies this assay can detect is 250 copies / mL. A negative result does not preclude SARS-CoV-2 infection and should not be used as the sole basis for treatment or other patient management decisions.  A negative result may occur with improper specimen collection / handling, submission of specimen other than nasopharyngeal swab, presence of viral mutation(s) within the areas targeted by this assay, and inadequate number of viral copies (<250 copies / mL). A negative result must be combined with clinical observations, patient history, and epidemiological information.  Fact Sheet for Patients:   BoilerBrush.com.cy  Fact Sheet for Healthcare Providers: https://pope.com/  This test is not yet approved or  cleared by the [place] FDA and has been authorized for detection and/or diagnosis of SARS-CoV-2 by FDA under an Emergency Use Authorization (EUA).  This EUA will remain in effect (meaning this test can be used) for the duration of the COVID-19 declaration under Section 564(b)(1) of the Act, 21 U.S.C. section 360bbb-3(b)(1), unless the authorization is terminated or revoked sooner.  Performed at [facility], [address], [place], [place] [address]   Culture, blood (Routine X 2) w Reflex to ID Panel     Status: None (Preliminary result)   Collection Time: 11/17/20 11:11 AM   Specimen: BLOOD RIGHT ARM  Result Value Ref Range Status   Specimen Description BLOOD RIGHT ARM  Final    Special Requests   Final    BOTTLES DRAWN AEROBIC ONLY Blood Culture results may not be optimal due to an inadequate volume of blood received in culture bottles   Culture   Final    NO GROWTH 4 DAYS Performed at [facility], [address], [place], [place] [address]    Report Status PENDING  Incomplete  Culture, blood (Routine X 2) w Reflex to ID Panel     Status: None (Preliminary result)   Collection Time: 11/17/20 11:11 AM   Specimen: BLOOD LEFT HAND  Result Value Ref Range Status   Specimen Description BLOOD LEFT HAND  Final   Special Requests   Final    BOTTLES DRAWN AEROBIC AND ANAEROBIC Blood Culture results may not be optimal due to an inadequate volume of blood received in culture bottles  Culture   Final    NO GROWTH 4 DAYS Performed at [facility], [address] [place], [place] [address]    Report Status PENDING  Incomplete    Procedures and diagnostic studies:  No results found.             LOS: 5 days   Helina Hullum  [facility]   Pager on [email or web address]. If 7PM-7AM, please contact night-coverage at [email or web address]     11/21/2020, 11:42 AM

## 2020-11-22 ENCOUNTER — Inpatient Hospital Stay (HOSPITAL_COMMUNITY): Payer: PRIVATE HEALTH INSURANCE

## 2020-11-22 DIAGNOSIS — B20 Human immunodeficiency virus [HIV] disease: Secondary | ICD-10-CM | POA: Diagnosis not present

## 2020-11-22 DIAGNOSIS — B259 Cytomegaloviral disease, unspecified: Secondary | ICD-10-CM

## 2020-11-22 DIAGNOSIS — H3090 Unspecified chorioretinal inflammation, unspecified eye: Secondary | ICD-10-CM

## 2020-11-22 DIAGNOSIS — J9601 Acute respiratory failure with hypoxia: Secondary | ICD-10-CM | POA: Diagnosis not present

## 2020-11-22 DIAGNOSIS — B59 Pneumocystosis: Secondary | ICD-10-CM | POA: Diagnosis not present

## 2020-11-22 DIAGNOSIS — A419 Sepsis, unspecified organism: Secondary | ICD-10-CM | POA: Diagnosis not present

## 2020-11-22 LAB — CBC
HCT: 30.2 % — ABNORMAL LOW (ref 36.0–46.0)
Hemoglobin: 8.8 g/dL — ABNORMAL LOW (ref 12.0–15.0)
MCH: 29.8 pg (ref 26.0–34.0)
MCHC: 29.1 g/dL — ABNORMAL LOW (ref 30.0–36.0)
MCV: 102.4 fL — ABNORMAL HIGH (ref 80.0–100.0)
Platelets: 78 10*3/uL — ABNORMAL LOW (ref 150–400)
RBC: 2.95 MIL/uL — ABNORMAL LOW (ref 3.87–5.11)
RDW: 16.4 % — ABNORMAL HIGH (ref 11.5–15.5)
WBC: 6.7 10*3/uL (ref 4.0–10.5)
nRBC: 0 % (ref 0.0–0.2)

## 2020-11-22 LAB — CULTURE, BLOOD (ROUTINE X 2)
Culture: NO GROWTH
Culture: NO GROWTH

## 2020-11-22 LAB — BASIC METABOLIC PANEL
Anion gap: 8 (ref 5–15)
BUN: 20 mg/dL (ref 6–20)
CO2: 27 mmol/L (ref 22–32)
Calcium: 8.6 mg/dL — ABNORMAL LOW (ref 8.9–10.3)
Chloride: 103 mmol/L (ref 98–111)
Creatinine, Ser: 1.16 mg/dL — ABNORMAL HIGH (ref 0.44–1.00)
GFR, Estimated: 58 mL/min — ABNORMAL LOW (ref >60)
Glucose, Bld: 154 mg/dL — ABNORMAL HIGH (ref 70–99)
Potassium: 5.4 mmol/L — ABNORMAL HIGH (ref 3.5–5.1)
Sodium: 138 mmol/L (ref 135–145)

## 2020-11-22 LAB — GLUCOSE, CAPILLARY
Glucose-Capillary: 133 mg/dL — ABNORMAL HIGH (ref 70–99)
Glucose-Capillary: 163 mg/dL — ABNORMAL HIGH (ref 70–99)
Glucose-Capillary: 107 mg/dL — ABNORMAL HIGH (ref 70–99)
Glucose-Capillary: 148 mg/dL — ABNORMAL HIGH (ref 70–99)

## 2020-11-22 LAB — MAGNESIUM: Magnesium: 1.8 mg/dL (ref 1.7–2.4)

## 2020-11-22 LAB — HAPTOGLOBIN: Haptoglobin: 220 mg/dL (ref 42–296)

## 2020-11-22 MED ORDER — LORAZEPAM 2 MG/ML IJ SOLN
0.5000 mg | Freq: Once | INTRAMUSCULAR | Status: AC
  Administered 2020-11-22: 0.5 mg via INTRAVENOUS
  Filled 2020-11-22: qty 1

## 2020-11-22 MED ORDER — LORAZEPAM 2 MG/ML IJ SOLN
0.5000 mg | Freq: Four times a day (QID) | INTRAMUSCULAR | Status: DC | PRN
  Administered 2020-11-22: 0.5 mg via INTRAVENOUS
  Filled 2020-11-22: qty 1

## 2020-11-22 MED ORDER — HYDRALAZINE HCL 20 MG/ML IJ SOLN: 10.0000 mg | Freq: Four times a day (QID) | INTRAMUSCULAR | Status: DC | PRN

## 2020-11-22 MED ORDER — SODIUM ZIRCONIUM CYCLOSILICATE 10 G PO PACK
10.0000 g | PACK | Freq: Every day | ORAL | Status: DC
  Administered 2020-11-23: 10 g via ORAL
  Filled 2020-11-22 (×2): qty 1

## 2020-11-22 NOTE — Progress Notes (Signed)
   11/22/20 1245  Oxygen Therapy/Pulse Ox  O2 Device (S)  HFNC (HHFNC setup per MD order, sputum specimen cup @ bedside with directions, pt is unable to produce a spontaneous, congested cough at this time.)  $ High Flow Nasal Cannula  Yes  O2 Therapy Oxygen humidified  Heater temperature 86.4 F (30.2 C)  O2 Flow Rate (L/min) (S)  30 L/min  FiO2 (%) (S)  100 %  SpO2 95 %

## 2020-11-22 NOTE — Progress Notes (Addendum)
Palliative:  HPI: 50 y.o.femalewith past medical history of HTN, HLD, retinal detachment with CMV retinitis, severe depression, HIV/AIDSadmitted on 1/25/2022with a fall. Found to have acute hypoxia from multifocal pneumonia - thought to be severe PJP pna.Also found to have oral candidiasis - on daily fluconazole for 3 weeks. Krystal Bowen also has a history of not taking her medications as prescribed. Krystal Bowen has had difficulty with desaturation 1/29 with oxygen requirements up to 15 L. Patient has also been bothered by anxiety and panic attacks. Seen by psych - citalopram daily recommended. PMT consulted to assist with symptom management.   I met today with Krystal Bowen. She is short of breath and appears very anxious. She appears to be close to tears. She expresses frustration with many people coming into her room when she is trying to rest. She allowed me to just ask for a couple questions. I asked her is she was still willing to hang in there with her treatment plan and she reports "I'm trying to." I told her that if she becomes too tired and cannot do this anymore she needs to just let us know at any point of time. She nods with understanding.   I also asked her about family and surrogate decision maker. She tells me that she has parents and 4 children but she would desire and trust her mother, Krystal Bowen, to make healthcare decisions for her if she is unable to do so herself. I asked her if I could call Krystal Bowen and see if she has any questions for [place] and Krystal Bowen would like for [place] to wait - "don't bother her, not until it gets worse." I told her that I respect her decision and just want to do anything that may be helpful for her. Will order anxiety medication for now to help her to relax and she agrees this would be helpful. She has had morphine without much relief.   Exam: Awake, alert, oriented. Cachectic. Very anxious and short of breath at rest. Accessory muscle use. Abd flat.   Plan: - Still  recommend low dose Zyprexa qhs.  - Declined bronchoscopy per PCCM note. Did not get to speak with patient about this decision.  -  Will add some lorazepam 0.5 mg IV every 6 hours as needed. She responded well to the one time dose per RN.  - Working with staff to allow her parents to come and visit with her. I am hoping that this will be helpful for Krystal Bowen to motivate her or maybe she can open up more to them than she will to [place].  - RN reports that she only takes part of her medications and that these are crushed so it is difficult to say how much of what she is getting at any given time. I did discuss with RN priority to ART regimen.   25 min  Vinie Sill, NP Palliative Medicine Team Pager [phone] (Please see [email or web address] for schedule) Team Phone [phone]    Greater than 50%  of this time was spent counseling and coordinating care related to the above assessment and plan

## 2020-11-22 NOTE — Progress Notes (Signed)
Progress Note    Krystal Bowen  WGN:[id number] DOB: 10/01/1971  DOA: 11/16/2020 PCP: Hoyt Koch, MD (Inactive)      Brief Narrative:    Medical records reviewed and are as summarized below:  Krystal Bowen is a 50 y.o. female history of HTN, HLD, retinal detachment with CMV retinitis, severe depression, HIV/AIDS admitted for acute hypoxia from multifocal pneumoniaon 4 L nasal cannula thought to be secondary to likely PJP pneumonia, currently on bactrim DS and steroids.       Assessment/Plan:   Principal Problem:   Sepsis due to undetermined organism with acute respiratory failure (HCC) Active Problems:   HIV disease (HCC)   HTN (hypertension)   Dyslipidemia   Depression   Noncompliance w/medication treatment due to intermit use of medication   AIDS (acquired immune deficiency syndrome) (HCC)   Thrush   CMV retinitis (HCC)   Unspecified severe protein-calorie malnutrition (HCC)   Protein-calorie malnutrition, severe   Adjustment disorder with mixed anxiety and depressed mood   Nutrition Problem: Severe Malnutrition Etiology: chronic illness (HIV/AIDS)  Signs/Symptoms: severe fat depletion,severe muscle depletion   Body mass index is 14.17 kg/m. (Underweight)    Acute Hypoxic Respiratory Failure Due to PJP Pneumonia, presumed. She was on 1 L/min oxygen via HFNC.  Oxygen therapy has been transitioned to oxygenation via heated humidified HFNC (FiO2 100% at 25 L/min). - PJP smear sent - Cultures negative -Continue prednisone and Bactrim CT chest obtained today showed bilateral infiltrates and irregular cysts suspicious for PJP pneumonia Pulmonologist was consulted at the recommendation of ID specialist for consideration for bronchoscopy.  Oral Thrush - Fluconazole 200 mg (1/25-2/15) per ID- 21 days    Situational anxiety, suspect component of underlying GAD - Dr. Jannifer Franklin, psychiatrist, started citalopram -Ativan as needed  Macrocytic  anemia -H&H is stable -Low folate (4.8), low normal vitamin B12 (300)  Thrombocytopenia, question if due to Bactrim, HIT (low likelihood),  -Platelets slowly improving. - HIT score is 2, low likelihood HIT   Hyperkalemia Potassium level is increasing.  Start Lokelma and monitor levels.  AKI Resolved   Mechanical fall due to weakness Appreciate PT and OT evaluation  HTN Restart HCTZ   HIV disease On Triumeq per ID Continue azithromycin for prophylaxis  Continue valganciclovir for prophylaxis         Diet Order            Diet regular Room service appropriate? Yes; Fluid consistency: Thin  Diet effective now                    Consultants:  Infectious disease  Behavioral health  Palliative care  Procedures:  None    Medications:   . azithromycin  1,200 mg Oral Q Sat  . darunavir-cobicistat  1 tablet Oral Q breakfast  . dolutegravir  50 mg Oral Daily  . feeding supplement  237 mL Oral TID BM  . fluconazole  200 mg Oral Daily  . heparin injection (subcutaneous)  5,000 Units Subcutaneous Q8H  . mirtazapine  7.5 mg Oral QHS  . multivitamin with minerals  1 tablet Oral Daily  . predniSONE  40 mg Oral BID  . sodium chloride flush  3 mL Intravenous Q12H  . sodium zirconium cyclosilicate  10 g Oral Daily  . sulfamethoxazole-trimethoprim  1 tablet Oral Q12H  . valGANciclovir  450 mg Oral Q48H   Continuous Infusions: . sodium chloride 1 mL (11/22/20 0553)     Anti-infectives (From admission,  onward)   Start     Dose/Rate Route Frequency Ordered Stop   11/21/20 0700  darunavir-cobicistat (PREZCOBIX) 800-150 MG per tablet 1 tablet        1 tablet Oral Daily with breakfast 11/20/20 1432     11/20/20 2200  valGANciclovir (VALCYTE) 450 MG tablet TABS 450 mg        450 mg Oral Every 48 hours 11/20/20 1430     11/20/20 1700  azithromycin (ZITHROMAX) tablet 1,200 mg        1,200 mg Oral Every Sat 11/20/20 1418     11/20/20 1530  dolutegravir  (TIVICAY) tablet 50 mg        50 mg Oral Daily 11/20/20 1432     11/20/20 1430  azithromycin (ZITHROMAX) tablet 1,200 mg  Status:  Discontinued        1,200 mg Oral Every Sat 11/20/20 1416 11/20/20 1418   11/19/20 2200  sulfamethoxazole-trimethoprim (BACTRIM DS) 800-160 MG per tablet 1 tablet        1 tablet Oral Every 12 hours 11/19/20 1148     11/18/20 1000  fluconazole (DIFLUCAN) tablet 200 mg        200 mg Oral Daily 11/18/20 0712     11/18/20 0900  valGANciclovir (VALCYTE) 450 MG tablet TABS 450 mg  Status:  Discontinued        450 mg Oral Once per day on Mon Thu 11/16/20 2114 11/20/20 1430   11/16/20 2200  valGANciclovir (VALCYTE) 450 MG tablet TABS 450 mg  Status:  Discontinued        450 mg Oral 2 times daily 11/16/20 1736 11/16/20 2114   11/16/20 2000  sulfamethoxazole-trimethoprim (BACTRIM) 170.08 mg in dextrose 5 % 250 mL IVPB  Status:  Discontinued        5 mg/kg  34 kg 260.6 mL/hr over 60 Minutes Intravenous Every 12 hours 11/16/20 1743 11/19/20 1148   11/16/20 1930  abacavir-dolutegravir-lamiVUDine (TRIUMEQ) 600-50-300 MG per tablet 1 tablet  Status:  Discontinued        1 tablet Oral Daily 11/16/20 1736 11/20/20 1430   11/16/20 1930  fluconazole (DIFLUCAN) tablet 400 mg  Status:  Discontinued        400 mg Oral Daily 11/16/20 1743 11/18/20 0712   11/16/20 1745  sulfamethoxazole-trimethoprim (BACTRIM) 200-40 MG/5ML suspension 20 mL  Status:  Discontinued        20 mL Oral Daily 11/16/20 1736 11/16/20 1743   11/16/20 1745  ceFEPIme (MAXIPIME) 2 g in sodium chloride 0.9 % 100 mL IVPB  Status:  Discontinued        2 g 200 mL/hr over 30 Minutes Intravenous  Once 11/16/20 1736 11/16/20 1743   11/16/20 1745  metroNIDAZOLE (FLAGYL) IVPB 500 mg  Status:  Discontinued        500 mg 100 mL/hr over 60 Minutes Intravenous Every 8 hours 11/16/20 1736 11/16/20 1842   11/16/20 1745  vancomycin (VANCOCIN) IVPB 1000 mg/200 mL premix  Status:  Discontinued        1,000 mg 200 mL/hr over 60  Minutes Intravenous  Once 11/16/20 1736 11/16/20 1743             Family Communication/Anticipated D/C date and plan/Code Status   DVT prophylaxis: heparin injection 5,000 Units Start: 11/16/20 2200     Code Status: DNR  Family Communication: None Disposition Plan:    Status is: Inpatient  Remains inpatient appropriate because:Inpatient level of care appropriate due to severity of illness  Dispo:  Patient From: Home  Planned Disposition: Home  Expected discharge date: 11/27/2020  Medically stable for discharge: No             Subjective:   C/o shortness of breath.  Objective:    Vitals:   11/22/20 0751 11/22/20 1207 11/22/20 1245 11/22/20 1431  BP: (!) 137/100     Pulse:      Resp:      Temp: 97.7 F (36.5 C)     TempSrc: Oral     SpO2:  95% 95% 100%  Weight:      Height:       No data found.   Intake/Output Summary (Last 24 hours) at 11/22/2020 1543 Last data filed at 11/22/2020 0227 Gross per 24 hour  Intake -  Output 700 ml  Net -700 ml   Filed Weights   11/16/20 1900  Weight: 34 kg    Exam:  GEN: NAD, cachectic SKIN: Warm and dry EYES: EOMI ENT: MMM CV: RRR PULM: CTA B ABD: soft, ND, NT, +BS CNS: AAO x 3, non focal EXT: No edema or tenderness    Data Reviewed:   I have personally reviewed following labs and imaging studies:  Labs: Labs show the following:   Basic Metabolic Panel: Recent Labs  Lab 11/18/20 0439 11/19/20 0458 11/20/20 0113 11/21/20 0037 11/22/20 0049  NA 134* 133* 133* 135 138  K 4.7 5.4* 5.5* 5.0 5.4*  CL 104 102 102 103 103  CO2 23 23 26 24 27   GLUCOSE 135* 96 106* 124* 154*  BUN 35* 27* 26* 22* 20  CREATININE 1.47* 1.20* 1.22* 1.14* 1.16*  CALCIUM 8.0* 8.2* 8.2* 8.1* 8.6*  MG 1.8 1.8 1.9 1.7 1.8  PHOS  --   --   --  2.9  --    GFR Estimated Creatinine Clearance: 31.5 mL/min (A) (by C-G formula based on SCr of 1.16 mg/dL (H)). Liver Function Tests: Recent Labs  Lab 11/16/20 1148  11/21/20 0037  AST 33  --   ALT 14  --   ALKPHOS 82  --   BILITOT 0.4  --   PROT 7.6  --   ALBUMIN 1.9* 1.5*   No results for input(s): LIPASE, AMYLASE in the last 168 hours. No results for input(s): AMMONIA in the last 168 hours. Coagulation profile No results for input(s): INR, PROTIME in the last 168 hours.  CBC: Recent Labs  Lab 11/16/20 1148 11/16/20 1158 11/18/20 0439 11/19/20 0458 11/20/20 0113 11/21/20 0037 11/22/20 0049  WBC 9.2   < > 10.0 7.2 6.8 8.0 6.7  NEUTROABS 7.7  --   --   --   --  7.5  --   HGB 11.4*   < > 8.5* 8.0* 7.8* 8.2* 8.8*  HCT 38.6   < > 27.3* 25.1* 25.4* 26.7* 30.2*  MCV 103.8*   < > 100.7* 99.2 100.0 100.4* 102.4*  PLT PLATELET CLUMPS NOTED ON SMEAR, UNABLE TO ESTIMATE   < > PLATELET CLUMPS NOTED ON SMEAR, UNABLE TO ESTIMATE PLATELET CLUMPS NOTED ON SMEAR, UNABLE TO ESTIMATE 69* 73* 78*   < > = values in this interval not displayed.   Cardiac Enzymes: No results for input(s): CKTOTAL, CKMB, CKMBINDEX, TROPONINI in the last 168 hours. BNP (last 3 results) No results for input(s): PROBNP in the last 8760 hours. CBG: Recent Labs  Lab 11/17/20 1147 11/22/20 0750 11/22/20 1155  GLUCAP 137* 133* 163*   D-Dimer: No results for input(s): DDIMER in the  last 72 hours. Hgb A1c: No results for input(s): HGBA1C in the last 72 hours. Lipid Profile: No results for input(s): CHOL, HDL, LDLCALC, TRIG, CHOLHDL, LDLDIRECT in the last 72 hours. Thyroid function studies: No results for input(s): TSH, T4TOTAL, T3FREE, THYROIDAB in the last 72 hours.  Invalid input(s): FREET3 Anemia work up: Recent Labs    11/21/20 0037  VITAMINB12 300  FOLATE 4.8*   Sepsis Labs: Recent Labs  Lab 11/16/20 1148 11/16/20 1415 11/17/20 0532 11/18/20 0439 11/19/20 0458 11/20/20 0113 11/21/20 0037 11/22/20 0049  PROCALCITON  --   --  0.32  --   --   --   --   --   WBC 9.2  --  4.8   < > 7.2 6.8 8.0 6.7  LATICACIDVEN 3.0* 1.6  --   --   --   --   --   --    <  > = values in this interval not displayed.    Microbiology Recent Results (from the past 240 hour(s))  SARS Coronavirus 2 by RT PCR (hospital order, performed in [facility] hospital lab) Nasopharyngeal Nasopharyngeal Swab     Status: None   Collection Time: 11/16/20 10:06 AM   Specimen: Nasopharyngeal Swab  Result Value Ref Range Status   SARS Coronavirus 2 NEGATIVE NEGATIVE Final    Comment: (NOTE) SARS-CoV-2 target nucleic acids are NOT DETECTED.  The SARS-CoV-2 RNA is generally detectable in upper and lower respiratory specimens during the acute phase of infection. The lowest concentration of SARS-CoV-2 viral copies this assay can detect is 250 copies / mL. A negative result does not preclude SARS-CoV-2 infection and should not be used as the sole basis for treatment or other patient management decisions.  A negative result may occur with improper specimen collection / handling, submission of specimen other than nasopharyngeal swab, presence of viral mutation(s) within the areas targeted by this assay, and inadequate number of viral copies (<250 copies / mL). A negative result must be combined with clinical observations, patient history, and epidemiological information.  Fact Sheet for Patients:   BoilerBrush.com.cy  Fact Sheet for Healthcare Providers: https://pope.com/  This test is not yet approved or  cleared by the [place] FDA and has been authorized for detection and/or diagnosis of SARS-CoV-2 by FDA under an Emergency Use Authorization (EUA).  This EUA will remain in effect (meaning this test can be used) for the duration of the COVID-19 declaration under Section 564(b)(1) of the Act, 21 U.S.C. section 360bbb-3(b)(1), unless the authorization is terminated or revoked sooner.  Performed at [facility] [place] [facility], [address] [place], [place] [address]   Culture, blood (Routine X 2) w Reflex to ID Panel      Status: None   Collection Time: 11/17/20 11:11 AM   Specimen: BLOOD RIGHT ARM  Result Value Ref Range Status   Specimen Description BLOOD RIGHT ARM  Final   Special Requests   Final    BOTTLES DRAWN AEROBIC ONLY Blood Culture results may not be optimal due to an inadequate volume of blood received in culture bottles   Culture   Final    NO GROWTH 5 DAYS Performed at [facility], [address] [place], [place] [address]    Report Status 11/22/2020 FINAL  Final  Culture, blood (Routine X 2) w Reflex to ID Panel     Status: None   Collection Time: 11/17/20 11:11 AM   Specimen: BLOOD LEFT HAND  Result Value Ref Range  Status   Specimen Description BLOOD LEFT HAND  Final   Special Requests   Final    BOTTLES DRAWN AEROBIC AND ANAEROBIC Blood Culture results may not be optimal due to an inadequate volume of blood received in culture bottles   Culture   Final    NO GROWTH 5 DAYS Performed at [facility], [address] [place], [place] [address]    Report Status 11/22/2020 FINAL  Final    Procedures and diagnostic studies:  CT CHEST WO CONTRAST  Result Date: 11/22/2020 CLINICAL DATA:  Inpatient. HIV respiratory failure. Multifocal pneumonia. EXAM: CT CHEST WITHOUT CONTRAST TECHNIQUE: Multidetector CT imaging of the chest was performed following the standard protocol without IV contrast. COMPARISON:  11/16/2020 chest radiograph. FINDINGS: Cardiovascular: Normal heart size. No significant pericardial effusion/thickening. Great vessels are normal in course and caliber. Mediastinum/Nodes: No discrete thyroid nodules. Unremarkable esophagus. No pathologically enlarged axillary, mediastinal or hilar lymph nodes, noting limited sensitivity for the detection of hilar adenopathy on this noncontrast study. Lungs/Pleura: No pneumothorax. No pleural effusion. Patchy consolidation with air bronchograms throughout the bilateral lower lobes. Extensive patchy ground-glass opacity with  scattered mild interlobular septal thickening throughout the remaining lungs. Mildly thick walled and mildly irregular pulmonary cysts scattered in both lungs predominantly in the upper lobes. No discrete lung masses or significant pulmonary nodules. Upper abdomen: No acute abnormality. Musculoskeletal:  No aggressive appearing focal osseous lesions. IMPRESSION: Patchy consolidation with air bronchograms throughout the bilateral lower lobes. Extensive patchy ground-glass opacity with scattered mild interlobular septal thickening throughout the remaining lungs. Mildly thick walled and mildly irregular pulmonary cysts scattered in both lungs, predominantly in the upper lobes. Findings are compatible with multilobar opportunistic pneumonia, favoring pneumocystis pneumonia given the cystic changes in the lungs. Electronically Signed   By: Delbert Phenix M.D.   On: 11/22/2020 11:06               LOS: 6 days   Gerilynn Mccullars  [facility]   Pager on [email or web address]. If 7PM-7AM, please contact night-coverage at [email or web address]     11/22/2020, 3:43 PM

## 2020-11-22 NOTE — Progress Notes (Signed)
PT Cancellation Note  Patient Details Name: Krystal Bowen MRN: [id number] DOB: 1971/07/04   Cancelled Treatment:    Reason Eval/Treat Not Completed: Medical issues which prohibited therapy.  Elevation of diastolic BP, on 30 L O2 HFNC.  Will reassess PT tolerance at another time.   Krystal Bowen 11/22/2020, 4:21 PM   Samul Dada, PT MS Acute Rehab Dept. Number: [facility] [id number] and [facility] [phone]

## 2020-11-22 NOTE — Progress Notes (Signed)
[facility] for Infectious Disease    Date of Admission:  11/16/2020   Total days of antibiotics 3   ID: Krystal Bowen is a 50 y.o. female with  Principal Problem:   Sepsis due to undetermined organism with acute respiratory failure ([name]) Active Problems:   HIV disease ([name])   HTN (hypertension)   Dyslipidemia   Depression   Noncompliance w/medication treatment due to intermit use of medication   AIDS (acquired immune deficiency syndrome) ([name])   Thrush   CMV retinitis ([name])   Unspecified severe protein-calorie malnutrition ([place])   Protein-calorie malnutrition, severe   Adjustment disorder with mixed anxiety and depressed mood    Subjective: Remains on hfnc at 11  No real improvement No other complaint   ROS: 12 point ros is negative, except for fatigue  Medications:  . azithromycin  1,200 mg Oral Q Sat  . darunavir-cobicistat  1 tablet Oral Q breakfast  . dolutegravir  50 mg Oral Daily  . feeding supplement  237 mL Oral TID BM  . fluconazole  200 mg Oral Daily  . heparin injection (subcutaneous)  5,000 Units Subcutaneous Q8H  . mirtazapine  7.5 mg Oral QHS  . multivitamin with minerals  1 tablet Oral Daily  . predniSONE  40 mg Oral BID  . sodium chloride flush  3 mL Intravenous Q12H  . sodium zirconium cyclosilicate  10 g Oral Daily  . sulfamethoxazole-trimethoprim  1 tablet Oral Q12H  . valGANciclovir  450 mg Oral Q48H    Objective: Vital signs in last 24 hours: Temp:  [97.5 F (36.4 C)-98.5 F (36.9 C)] 97.7 F (36.5 C) (01/31 0751) Pulse Rate:  [75-129] 75 (01/31 0344) Resp:  [18-43] 18 (01/31 0027) BP: (115-145)/(68-106) 137/100 (01/31 0751) SpO2:  [92 %-100 %] 100 % (01/31 0027)  o2 11 liters [place]  Physical Exam  Constitutional:  No distress; conversant; chronically ill appearing/thin HEENT: conj clear; eomi; oropharynx is clear and moist Cardiovascular: rrr no mrg Lungs mild rales; normal respiratory effort Neck = supple, no nuchal  rigidity Abdominal: Soft/nt Neurological: nonfocal Skin: no rash   Lab Results Recent Labs    11/21/20 0037 11/22/20 0049  WBC 8.0 6.7  HGB 8.2* 8.8*  HCT 26.7* 30.2*  NA 135 138  K 5.0 5.4*  CL 103 103  CO2 24 27  BUN 22* 20  CREATININE 1.14* 1.16*   Liver Panel Recent Labs    11/21/20 0037  ALBUMIN 1.5*   Sedimentation Rate No results for input(s): ESRSEDRATE in the last 72 hours. C-Reactive Protein No results for input(s): CRP in the last 72 hours.  Serology: Lab Results  Component Value Date   HIV1RNAQUANT 67,600 (H) 10/27/2020   HIV1RNAQUANT 32,500 (H) 08/10/2020   HIV1RNAQUANT 279,000 (H) 06/08/2020   Lab Results  Component Value Date   CD4TABS <35 (L) 10/27/2020   CD4TABS <35 (L) 08/10/2020   CD4TABS <35 (L) 06/08/2020   Pending pjp sputum smear 1/29 blastomyces ag ip 1/29 urine histo ag needs to be collected 1/29 urine gc/chlam needs to be collected 1/29 serum cryptococcal ag negative 1/05 rpr negative   Microbiology: 1/29 afb blood culture 1/26 bcx negative  Studies/Results: 1/25 cxr I reviewed Bilateral multifocal patchy opacity Widespread airspace opacity bilaterally, concerning for multifocal pneumonia, likely of atypical organism etiology. Differential considerations must include noncardiogenic pulmonary edema and widespread aspiration.  Heart size normal.  Assessment/Plan: 50 yo female with aids, poor compliance, cmv retinitis, admitted 1/25 after a GLF and  right hip injury, found to have hypoxemic respiratory failure (60% RA at presentation) concerned for pjp pna, also with oral candidiasis  No improvement from respiratory standpoint despite pjp tx/steroid empirically  #hypoxemic resp failure #severe pjp pna She is r/o'ed for covid infection; presumed pjp; cxr bilateral patchy focal opacities Serum CrAg negative ddx is wide though to include other OI, kaposi, mac, endemic fungi, tb  -continue oral bactrim tx dose -continue  prednisone pjp course of 21 days; day 1 = 1/25 -will need pulmonology input, she needs a bronchoscopy for better diagnosis given her AIDS status, lack of response to pjp tx, and unable to give sputum   #aids Hx noncompliance Current ART is triumeq vl in the past year suggest she is not taking triumeq consistently if at all 1/05 genotype without integrase strand inhibitor resistance; 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; PR mutation m46L, A71V, A82I, V77I -I think optimal ART for her would be prezcobix/tivicay based on her genotype testing and adherence -stop triumeq -start prezcobix/tivicay -DDI analysis along with other meds/art Stop amitryptiline, celexa Start remeron 7.5 mg qhs   #hx cmv retinitis She had tx in the past; would continue maintenance therapy due to high risk of relapse with current immune status She reports chronic stable left eye blurriness; she reports her last eye exam was 08/2020 -continue valgangciclovir 450 mg renal dosing q48hours until cd4 > 100 for at least 3 months, and no ophthalmologic concern and on stable art   #oral thrush improved -continue fluconazole 400 mg po daily; plan 3 week course until 12/03/2020   #Mac prophy -continue weekly azithromycin 1200 mg; would keep as she hasn't demonstrated to be consistently taking her ART  #thrombocytopenia/anemia With her chronicly depressed cd4 <50, I would send for fungal serology and afb bcx along with ldh to complete w/u  There is potentially contribution from bactrim/valcyte but these were just started; so will keep them on and continue to monitor Serum CrAg negative -Other non-id causes being worked per primary team -continue valcyte/bactrim -f/u urine histo ag, blasto ag, blood afb culture  #hcm A) std screen 10/27/20 rpr negative -f/u urine gc/chlam B) tb/metabolic/women's health -- defer to outpatient     [facility]  11/22/2020, 9:10 AM

## 2020-11-22 NOTE — Consult Note (Addendum)
NAME:  Krystal Bowen, MRN:  [id number], DOB:  August 30, 1971, LOS: 6 ADMISSION DATE:  11/16/2020, CONSULTATION DATE:  11/22/2020 REFERRING MD:  Dr. Mal Misty, CHIEF COMPLAINT:  Hypoxic respiratory failure  Brief History:  21 yoF with hx of HIV/ AID presenting after mechanical fall found to be hypoxic with patchy multifocal inflitrates.  Admitted to [facility] with ID consulting, treating for presumed PJP, however patient has failed to improve despite empiric abx and steroids.  PCCM consulted for possible diagnostic FOB.   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  Patient reports she does not take her HIV or prophylactic medications as she is supposed to, gives limited information and states she is tired when asked .  Reports 15-20 lbs weight loss over the last year.  Denies fever or productive cough, or previous PJP infection. Reports intermittent diarrhea.  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  COVID negative.  Admitted to [facility] with ID consulting.  CXR concerning for multifocal patchy infiltrates; hip XR neg for fracture. On admit, she was afebrile, normal WBC, PCT 0.32.  She was restarted on ART therapy and started bactrim and azithromycin.  ID workup in process.  On 1/28, she was noted to have increasing O2 requirements, started on prednisone.   However, patient continues to require > 11L on salter HF and has not been able to produce a sputum. CT chest w/o contrast obtained this morning with findings consistent with multilobar opportunistic pneumonia, favoring PJP given cystic changes.  Pulmonary consulted for possible  diagnostic FOB.   Past Medical History:  AIDS/ HIV, HTN, HLD, CMV retinitis with prior retinal detachment and chronic right eye blurriness, depression, medical non compliance   Never smoker Significant Hospital Events:  1/25 admitted [place]  1/28 increasing O2 requirements/ steroids added  1/31 PCCM consulted  Consults:  ID Psychiatry  PMT Pulmonary   Procedures:   Significant Diagnostic Tests:  1/31 CT chest  >> Patchy consolidation with air bronchograms throughout the bilateral lower lobes. Extensive patchy ground-glass opacity with scattered mild interlobular septal thickening throughout the remaining lungs. Mildly thick walled and mildly irregular pulmonary cysts scattered in both lungs, predominantly in the upper lobes. Findings are compatible with multilobar opportunistic pneumonia, favoring pneumocystis pneumonia given the cystic changes in the lungs.  Micro Data:  1/25 SARS >> neg 1/26 BCx2 >> ngtd  1/05 RPR >> negative 1/29 blastomyces ag >> 1/29 urine histo ag >> needs to be collected  1/29 urine gc/chlam >> needs to be collected 1/29 serum cryptococcal ag >> negative 1/29 mycobaterial BC >>  Antimicrobials:  1/25 bactrim >> (dose increased 1/28) >> 1/25 fluconazole >> 1/29 azithromycin (weekly 1284m)  1/28 prednisone >>  Interim History / Subjective:   Objective   Blood pressure (!) 137/100, pulse 75, temperature 97.7 F (36.5 C), temperature source Oral, resp. rate 18, height _0  (1.549 m), weight 34 kg, SpO2 100 %.        Intake/Output Summary (Last 24 hours) at 11/22/2020 1032 Last data filed at 11/22/2020 0227 Gross per 24 hour  Intake -  Output 700 ml  Net -700 ml   FAutoliv  11/16/20 1900  Weight: 34 kg   Examination: General:  Chronically ill/ cachetic female sitting upright in bed, anxious appearing HEENT: MM pink/moist, oral thrush, right pupil opaque, left reactive Neuro: AOx 4, MAE CV: rr, ST PULM:  tachypneic 30-50's, CTA,  diminished in bases, no wheezing, on salter [place] unable to wean below 12L on exam  GI: soft, bs active  Extremities: warm/dry, no edema  Skin: no rash  Continues to be afebrile/ normal WBC Resolved Hospital Problem list   AKI   Assessment & Plan:   Acute hypoxic respiratory failure secondary to presumed PJP pneumonia, however ddx remain wide given immunocompromised host Sepsis due to undetermined organism  - no clinical improvement despite abx/ steroids empirically (prednisone 40 mg BID started 1/28) - serum CrAg negative, COVID neg  - on MAC prophylaxis, azithro 1200 mg weekly  - continue bactrim per ID/ ongoing prednisone 40 mg BID - has not been able to produce sputum thus far for PJP smear; will try for induced sputum today if patient allows - discussed possible FOB with Dr. Elsworth Soho. At this time, patient remains too high risk given her ongoing tachypnea and oxygen requirement, would need to be intubated for FOB.  At this time, patient states she does not want intubation or CPR.  We discussed possible short term trial of intubation however patient stated she was tired and needed to rest.  If her clinical status improves, will re-consider FOB.  - CT chest w/o contrast 1/31 as above, c/w multilobar opportunistic pneumonia, favoring PJP given cystic changes  - continue supplemental O2 for sat goal >92%, will switch from salter HFNC to heated high flow given her tachypnea (exacerbated by her anxiety) to empirically prevent her from further respiratory decompensations.  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  - continue valgangciclovir per ID/  pharmacy  Remainder per primary team:   HTN- holding home HCTZ Severe malnutrition, BMI 14.17 kg/m Depression/ anxiety  Macrocytic anemia -Hgb stable Thrombocytopenia - slowly improving Mechanical fall - PT/ OT, imaging negative  Best practice (evaluated daily)  Diet: per primary  Pain/Anxiety/Delirium protocol (if indicated): n/a VAP protocol (if indicated): n/a DVT prophylaxis: heparin SQ GI prophylaxis: n/a Glucose control: trend on BMET Mobility: PT/ OT Disposition: PCU  Goals of Care:  Code Status: - currently patient DNR/ DNI as of 1/25, PMT following.    Labs   CBC: Recent Labs  Lab 11/16/20 1148 11/16/20 1158 11/18/20 0439 11/19/20 0458 11/20/20 0113 11/21/20 0037 11/22/20 0049  WBC 9.2   < > 10.0 7.2 6.8 8.0 6.7  NEUTROABS 7.7  --   --   --   --  7.5  --   HGB 11.4*   < > 8.5* 8.0* 7.8* 8.2* 8.8*  HCT 38.6   < > 27.3* 25.1* 25.4* 26.7* 30.2*  MCV 103.8*   < > 100.7* 99.2 100.0 100.4* 102.4*  PLT PLATELET CLUMPS NOTED ON SMEAR, UNABLE TO ESTIMATE   < > PLATELET CLUMPS NOTED ON SMEAR, UNABLE TO ESTIMATE PLATELET CLUMPS NOTED ON SMEAR, UNABLE TO ESTIMATE 69* 73* 78*   < > = values in this interval  not displayed.    Basic Metabolic Panel: Recent Labs  Lab 11/18/20 0439 11/19/20 0458 11/20/20 0113 11/21/20 0037 11/22/20 0049  NA 134* 133* 133* 135 138  K 4.7 5.4* 5.5* 5.0 5.4*  CL 104 102 102 103 103  CO2 _0 GLUCOSE 135* 96 106* 124* 154*  BUN 35* 27* 26* 22* 20  CREATININE 1.47* 1.20* 1.22* 1.14* 1.16*  CALCIUM 8.0* 8.2* 8.2* 8.1* 8.6*  MG 1.8 1.8 1.9 1.7 1.8  PHOS  --   --   --  2.9  --    GFR: Estimated Creatinine Clearance: 31.5 mL/min (A) (by C-G formula based on SCr of 1.16 mg/dL (H)). Recent Labs  Lab 11/16/20 1148 11/16/20 1415 11/17/20 0532 11/18/20 0439 11/19/20 0458 11/20/20 0113 11/21/20 0037 11/22/20 0049  PROCALCITON  --   --  0.32  --   --   --   --   --   WBC 9.2  --  4.8   < > 7.2 6.8 8.0 6.7  LATICACIDVEN  3.0* 1.6  --   --   --   --   --   --    < > = values in this interval not displayed.    Liver Function Tests: Recent Labs  Lab 11/16/20 1148 11/21/20 0037  AST 33  --   ALT 14  --   ALKPHOS 82  --   BILITOT 0.4  --   PROT 7.6  --   ALBUMIN 1.9* 1.5*   No results for input(s): LIPASE, AMYLASE in the last 168 hours. No results for input(s): AMMONIA in the last 168 hours.  ABG    Component Value Date/Time   HCO3 27.9 11/16/2020 1158   TCO2 29 11/16/2020 1158   O2SAT 58.0 11/16/2020 1158     Coagulation Profile: No results for input(s): INR, PROTIME in the last 168 hours.  Cardiac Enzymes: No results for input(s): CKTOTAL, CKMB, CKMBINDEX, TROPONINI in the last 168 hours.  HbA1C: No results found for: HGBA1C  CBG: Recent Labs  Lab 11/17/20 1147 11/22/20 0750  GLUCAP 137* 133*    Review of Systems:   As per HPI otherwise negative.   Past Medical History:  She,  has a past medical history of Anemia, HIV infection ([place]), Hyperlipidemia, Hypertension, and Neuropathy.   Surgical History:   Past Surgical History:  Procedure Laterality Date  . HERNIA REPAIR       Social History:   reports that she has never smoked. She has never used smokeless tobacco. She reports previous alcohol use. She reports that she does not use drugs.   Family History:  Her family history includes Alzheimer's disease in her maternal grandmother; Cancer in her father; Heart attack in her maternal grandfather and mother; Hyperlipidemia in her maternal grandmother and mother; Stroke in her mother.   Allergies No Known Allergies   Home Medications  Prior to Admission medications   Medication Sig Start Date End Date Taking? Authorizing Provider  amitriptyline (ELAVIL) 25 MG tablet Take 1 tablet (25 mg total) by mouth at bedtime as needed for sleep. 11/09/20  Yes Michel Bickers, MD  hydrochlorothiazide (HYDRODIURIL) 25 MG tablet TAKE 1 TABLET(25 MG) BY MOUTH DAILY Patient taking  differently: Take 25 mg by mouth daily. 01/07/19  Yes Michel Bickers, MD  valGANciclovir (VALCYTE) 450 MG tablet Take 1 tablet (450 mg total) by mouth daily. Patient taking differently: Take 450 mg by mouth 2 (two) times daily. 05/15/19  Yes Megan Salon,  [name], MD  sulfamethoxazole-trimethoprim (BACTRIM) 200-40 MG/5ML suspension Take 20 mLs by mouth daily. 06/08/20   Michel Bickers, [place] [phone] [place] tablet TAKE 1 TABLET BY MOUTH DAILY 11/19/20   Michel Bickers, MD          Kennieth Rad, ACNP [facility] 11/22/2020, 11:59 AM

## 2020-11-23 DIAGNOSIS — B259 Cytomegaloviral disease, unspecified: Secondary | ICD-10-CM | POA: Diagnosis not present

## 2020-11-23 DIAGNOSIS — F332 Major depressive disorder, recurrent severe without psychotic features: Secondary | ICD-10-CM

## 2020-11-23 DIAGNOSIS — R6520 Severe sepsis without septic shock: Secondary | ICD-10-CM | POA: Diagnosis not present

## 2020-11-23 DIAGNOSIS — H3092 Unspecified chorioretinal inflammation, left eye: Secondary | ICD-10-CM

## 2020-11-23 DIAGNOSIS — A419 Sepsis, unspecified organism: Secondary | ICD-10-CM | POA: Diagnosis not present

## 2020-11-23 DIAGNOSIS — J9600 Acute respiratory failure, unspecified whether with hypoxia or hypercapnia: Secondary | ICD-10-CM | POA: Diagnosis not present

## 2020-11-23 DIAGNOSIS — J9601 Acute respiratory failure with hypoxia: Secondary | ICD-10-CM | POA: Diagnosis not present

## 2020-11-23 DIAGNOSIS — B20 Human immunodeficiency virus [HIV] disease: Secondary | ICD-10-CM | POA: Diagnosis not present

## 2020-11-23 DIAGNOSIS — I10 Essential (primary) hypertension: Secondary | ICD-10-CM

## 2020-11-23 DIAGNOSIS — Z9114 Patient's other noncompliance with medication regimen: Secondary | ICD-10-CM

## 2020-11-23 LAB — BASIC METABOLIC PANEL
Anion gap: 6 (ref 5–15)
BUN: 23 mg/dL — ABNORMAL HIGH (ref 6–20)
CO2: 26 mmol/L (ref 22–32)
Calcium: 8.6 mg/dL — ABNORMAL LOW (ref 8.9–10.3)
Chloride: 106 mmol/L (ref 98–111)
Creatinine, Ser: 0.9 mg/dL (ref 0.44–1.00)
GFR, Estimated: >60 mL/min (ref >60)
Glucose, Bld: 121 mg/dL — ABNORMAL HIGH (ref 70–99)
Potassium: 5.6 mmol/L — ABNORMAL HIGH (ref 3.5–5.1)
Sodium: 138 mmol/L (ref 135–145)

## 2020-11-23 LAB — GLUCOSE, CAPILLARY
Glucose-Capillary: 124 mg/dL — ABNORMAL HIGH (ref 70–99)
Glucose-Capillary: 131 mg/dL — ABNORMAL HIGH (ref 70–99)
Glucose-Capillary: 134 mg/dL — ABNORMAL HIGH (ref 70–99)
Glucose-Capillary: 171 mg/dL — ABNORMAL HIGH (ref 70–99)

## 2020-11-23 LAB — PROCALCITONIN: Procalcitonin: <0.1 ng/mL

## 2020-11-23 LAB — MISC LABCORP TEST (SEND OUT): Labcorp test code: 183764

## 2020-11-23 LAB — C-REACTIVE PROTEIN: CRP: 2.2 mg/dL — ABNORMAL HIGH (ref <1.0)

## 2020-11-23 MED ORDER — POLYETHYLENE GLYCOL 3350 17 G PO PACK
17.0000 g | PACK | Freq: Two times a day (BID) | ORAL | Status: DC | PRN
  Administered 2020-11-25: 17 g via ORAL
  Filled 2020-11-23: qty 1

## 2020-11-23 MED ORDER — METOPROLOL TARTRATE 12.5 MG HALF TABLET: 12.5000 mg | ORAL_TABLET | Freq: Two times a day (BID) | ORAL | Status: DC

## 2020-11-23 MED ORDER — METHYLPREDNISOLONE SODIUM SUCC 40 MG IJ SOLR
40.0000 mg | Freq: Two times a day (BID) | INTRAMUSCULAR | Status: DC
  Administered 2020-11-23 – 2020-11-25 (×5): 40 mg via INTRAVENOUS
  Filled 2020-11-23 (×5): qty 1

## 2020-11-23 MED ORDER — MORPHINE SULFATE (PF) 2 MG/ML IV SOLN: 2.0000 mg | INTRAVENOUS | Status: DC | PRN

## 2020-11-23 MED ORDER — SALINE SPRAY 0.65 % NA SOLN
1.0000 | NASAL | Status: DC | PRN
  Filled 2020-11-23: qty 44

## 2020-11-23 MED ORDER — SENNOSIDES-DOCUSATE SODIUM 8.6-50 MG PO TABS: 1.0000 | ORAL_TABLET | Freq: Two times a day (BID) | ORAL | Status: DC | PRN

## 2020-11-23 MED ORDER — METOPROLOL TARTRATE 25 MG PO TABS
25.0000 mg | ORAL_TABLET | Freq: Two times a day (BID) | ORAL | Status: DC
  Administered 2020-11-23 – 2020-11-27 (×6): 25 mg via ORAL
  Filled 2020-11-23 (×7): qty 1

## 2020-11-23 MED ORDER — SODIUM CHLORIDE 0.9 % IV SOLN
2.0000 g | INTRAVENOUS | Status: DC
  Administered 2020-11-23 – 2020-11-25 (×3): 2 g via INTRAVENOUS
  Filled 2020-11-23 (×3): qty 2

## 2020-11-23 MED ORDER — DOCUSATE SODIUM 100 MG PO CAPS: 100.0000 mg | ORAL_CAPSULE | Freq: Two times a day (BID) | ORAL | Status: DC

## 2020-11-23 MED ORDER — VALGANCICLOVIR HCL 450 MG PO TABS
450.0000 mg | ORAL_TABLET | Freq: Every day | ORAL | Status: DC
  Administered 2020-11-23 – 2020-11-25 (×3): 450 mg via ORAL
  Filled 2020-11-23 (×5): qty 1

## 2020-11-23 MED ORDER — SODIUM ZIRCONIUM CYCLOSILICATE 10 G PO PACK: 10.0000 g | PACK | Freq: Once | ORAL | Status: DC

## 2020-11-23 MED ORDER — MORPHINE SULFATE (CONCENTRATE) 10 MG/0.5ML PO SOLN: 5.0000 mg | ORAL | Status: DC | PRN

## 2020-11-23 NOTE — Plan of Care (Signed)
  Problem: Education: Goal: Knowledge of General Education information will improve Description Including pain rating scale, medication(s)/side effects and non-pharmacologic comfort measures Outcome: Progressing   

## 2020-11-23 NOTE — Progress Notes (Signed)
Nutrition Follow Up  DOCUMENTATION CODES:   Underweight,Severe malnutrition in context of chronic illness  INTERVENTION:   No BM since 1/23- need scheduled bowel regimen   Ensure Enlive po TID, each supplement provides 350 kcal and 20 grams of protein  Magic cup TID with meals, each supplement provides 290 kcal and 9 grams of protein  MVI daily   NUTRITION DIAGNOSIS:   Severe Malnutrition related to chronic illness (HIV/AIDS) as evidenced by severe fat depletion,severe muscle depletion.  Ongoing  GOAL:   Patient will meet greater than or equal to 90% of their needs   Progressing   MONITOR:   PO intake,Supplement acceptance,Weight trends,Labs,I & O's  REASON FOR ASSESSMENT:   Consult Assessment of nutrition requirement/status  ASSESSMENT:   Patient with PMH significant for HTN, HLD, retinal detachment with CMV retinitis, severe depression, and HIV/AIDS. Presents this admission after a mechanical fall secondary to weakness. Found to have PNA.   Patient at high risk for intubation.   Patient sleeping upon arrival. Attempted to discuss appetite/intake but patient unable to stay awake. No meal completions charted at this time. RD observed breakfast tray at bedside 50% completed (pancakes/eggs). Ensure at bedside with a few sips taken. Continue current interventions.   Patient has not had BM since 1/23. Need scheduled regimen. Of note, patient refusing some medications. This may be barrier.   Admission weight: 34 kg   UOP: 900 ml x 24 hrs   Medications: solumedrol, remeron, lokelma Labs: K 5.6 (H) CBG 107-163  Diet Order:   Diet Order            Diet regular Room service appropriate? Yes; Fluid consistency: Thin  Diet effective now                 EDUCATION NEEDS:   Education needs have been addressed  Skin:  Skin Assessment: Reviewed RN Assessment  Last BM:  1/23  Height:   Ht Readings from Last 1 Encounters:  11/16/20 5\' 1"  (1.549 m)     Weight:   Wt Readings from Last 1 Encounters:  11/16/20 34 kg    BMI:  Body mass index is 14.17 kg/m.  Estimated Nutritional Needs:   Kcal:  1200-1400 kcal  Protein:  65-85 grams  Fluid:  >/= 1.2 L/day  11/18/20 RD, LDN Clinical Nutrition Pager listed in AMION

## 2020-11-23 NOTE — Progress Notes (Signed)
Palliative:  HPI:50 y.o.femalewith past medical history of HTN, HLD, retinal detachment with CMV retinitis, severe depression, HIV/AIDSadmitted on 1/25/2022with a fall. Found to have acute hypoxia from multifocal pneumonia - thought to be severe PJP pna.Also found to have oral candidiasis - on daily fluconazole for 3 weeks. Krystal Bowen also has a history of not taking her medications as prescribed. Krystal Bowen has had difficulty with desaturation 1/29 with oxygen requirements up to 15 L. Patient has also been bothered by anxiety and panic attacks. Seen by psych - citalopram daily recommended. PMT consulted to assist with symptom management.  I met today with Krystal Bowen along with PA Glori Luis who is following with me today. She continues to be the same. Continues to be a struggle for nurses to convince her to take medication. She is weak and "tired." She continues to tell me that she is too tired to talk. She does tell me that it is okay for her parents to come and visit with her. I did ask her if she was tired of provider visits, lab sticks, medications and she nods yes. I asked if she would rather focus on her comfort and just leaving her alone and letting her rest and she nods yes. I further explained that this could mean that her time here on earth may be very limited and is this still what she wants knowing it would limit her time and she nods yes. I left her to rest per her wishes.   I called and spoke with sister, [name], and explained above conversation and need for further conversation with family. I met soon after with Krystal Bowen and mother, Krystal Bowen. Krystal Bowen is who Krystal Bowen has expressed desire to be her surrogate Media planner. I met with them both and Krystal Bowen and we discussed limited options. We discussed that to really treat her appropriately that bronchoscopy would be needed and Krystal Bowen does not desire this. We discussed the struggles she has with taking medication and the  severe fatigue. We discussed fully the option of focusing on comfort care and not continuing to discuss and both Krystal Bowen with these options and decisions but to allow for rest and comfort. Krystal Bowen became frustrated and tells [place] she is too tired and she just wants to sleep. She gives me permission to discuss her situation further with her sister and mother outside the room where we would not interrupt her rest.   I spoke with [place] and Krystal Bowen. We explained difficult situation and that I worry because Krystal Bowen always just says she needs to rest and sleep. She is not open to taking medications unless RN really pushes medications. Krystal Bowen speaks up and shares that she believes that Krystal Bowen is tired and "ready to go." Family expressed that she recently shared her diagnosis with children and they feel this was part of her way of preparing and doing what she needed to do before end of life. They feel that Krystal Bowen deserves to be comfortable and not suffer anymore and that this is her desire that she struggles to verbally express. I agree.   We further discussed that even with aggressive care there is a chance that Krystal Bowen will not survive hospitalization. I do worry about her ability to wean from ventilator support if proceeding with biopsy given severe lung infection and overall frailty. I also worry that Krystal Bowen's heart is not committed to aggressive care and if she is not committed then no matter what we  do she will not be able to improve. They agree and understand. They plan to visit more with Krystal Bowen, discuss with her children, and I have offered to meet with more children and further discuss as needed. They will let me know. I provided them with my contact information.   All questions/concerns addressed. Emotional support provided.   Exam: Lethargic but answers questions. Exam and interview limited by patient cooperation. Cachectic. Less anxious and short of breath compared to  yesterday. Abd flat.   Plan: - Willing to meet with children for further goals of care.  - Based on conversation today I will liberalize visitation for family, minimize pill burden, and liberalize medication as needed for comfort.  - Patient expressed desire for comfort care this morning (I feel her actions and responses have consistently supported this desire over the days I have been following). Krystal Bowen and Krystal Bowen agree they feel this is her desire. Difficult to have true and in depth goals of care conversation with patient.  - Per ID Dr. Gale Journey goals unclear from her conversation with patient. We will continue to follow and discuss again tomorrow. Plan remains unchanged.   [place], NP Palliative Medicine Team Pager [phone] (Please see [email or web address] for schedule) Team Phone [phone]    Greater than 50%  of this time was spent counseling and coordinating care related to the above assessment and plan

## 2020-11-23 NOTE — Progress Notes (Signed)
[facility]    Date of Admission:  11/16/2020   Total days of antibiotics 3   ID: Krystal Bowen is a 50 y.o. 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[place])   Unspecified severe protein-calorie malnutrition ([place])   Protein-calorie malnutrition, severe   Adjustment disorder with mixed anxiety and depressed mood    Subjective: Is on hfnc 30L @ 100% Ct chest reviewed so far c/w pjp process Refused bronch  Said she is tired  Palliative care had suggested that patient is considering comfort measure, although she just wants to be left alone for today so she can rest   ROS: 12 point ros is negative, except for fatigue  Medications:   azithromycin  1,200 mg Oral Q Sat   darunavir-cobicistat  1 tablet Oral Q breakfast   dolutegravir  50 mg Oral Daily   feeding supplement  237 mL Oral TID BM   fluconazole  200 mg Oral Daily   heparin injection (subcutaneous)  5,000 Units Subcutaneous Q8H   mirtazapine  7.5 mg Oral QHS   multivitamin with minerals  1 tablet Oral Daily   predniSONE  40 mg Oral BID   sodium chloride flush  3 mL Intravenous Q12H   sodium zirconium cyclosilicate  10 g Oral Daily   sodium zirconium cyclosilicate  10 g Oral Once   sulfamethoxazole-trimethoprim  1 tablet Oral Q12H   valGANciclovir  450 mg Oral Daily    Objective: Vital signs in last 24 hours: Temp:  [98 F (36.7 C)-99 F (37.2 C)] 98.6 F (37 C) (02/01 0738) Pulse Rate:  [82-125] 93 (02/01 0738) Resp:  [18-25] 22 (02/01 0738) BP: (126-145)/(94-105) 137/103 (02/01 0738) SpO2:  [95 %-100 %] 100 % (02/01 0738) FiO2 (%):  [100 %] 100 % (02/01 0724)  o2 11 liters  [place]  Physical Exam  Constitutional:  No distress; conversant; chronically ill appearing/thin HEENT: conj clear; eomi; oropharynx is clear and moist Cardiovascular: rrr no mrg Lungs mild rales; normal respiratory effort Neck = supple, no nuchal rigidity Abdominal: Soft/nt Neurological: nonfocal Skin: no rash   Lab Results Recent Labs    11/21/20 0037 11/22/20 0049 11/23/20 0213  WBC 8.0 6.7  --   HGB 8.2* 8.8*  --   HCT 26.7* 30.2*  --   NA 135 138 138  K 5.0 5.4* 5.6*  CL 103 103 106  CO2 _0 BUN 22* 20 23*  CREATININE 1.14* 1.16* 0.90   Liver Panel Recent Labs    11/21/20 0037  ALBUMIN 1.5*   Sedimentation Rate No results for input(s): ESRSEDRATE in the last 72 hours. C-Reactive Protein No results for input(s): CRP in the last 72 hours.  Serology: Lab Results  Component Value Date   HIV1RNAQUANT 67,600 (H) 10/27/2020   HIV1RNAQUANT 32,500 (H) 08/10/2020   HIV1RNAQUANT 279,000 (H) 06/08/2020   Lab Results  Component Value Date   CD4TABS <35 (L) 10/27/2020   CD4TABS <35 (L) 08/10/2020   CD4TABS <35 (L) 06/08/2020   Pending pjp sputum smear 1/29 blastomyces ag ip 1/29 urine histo ag needs to be collected 1/29 urine gc/chlam needs to be collected 1/29 serum  cryptococcal ag negative 1/05 rpr negative   Microbiology: 1/29 afb blood culture 1/26 bcx negative  Studies/Results: 1/25 cxr I reviewed Bilateral multifocal patchy opacity Widespread airspace opacity bilaterally, concerning for multifocal pneumonia, likely of atypical organism etiology. Differential considerations must include noncardiogenic pulmonary edema and widespread aspiration.   Heart size normal.   1/31 chest ct Patchy consolidation with air bronchograms throughout the bilateral lower lobes. Extensive patchy ground-glass opacity with scattered mild interlobular septal thickening throughout the remaining lungs. Mildly thick walled and mildly irregular pulmonary cysts  scattered in both lungs, predominantly in the upper lobes. Findings are compatible with multilobar opportunistic pneumonia, favoring pneumocystis pneumonia given the cystic changes in the lungs.    Assessment/Plan: 50 yo female with aids, poor compliance, cmv retinitis, admitted 1/25 after a GLF and right hip injury, found to have hypoxemic respiratory failure (60% RA at presentation) concerned for pjp pna, also with oral candidiasis  2/01 assessment No improvement from respiratory standpoint despite pjp tx/steroid empirically. O2 requirement had escalated to hfnc 30L 100% as of 11/23/2020 With regard to goals of care, it appears she is not ready to transition to comfort care; it is not entirely clear what she wants at this time  #hypoxemic resp failure #severe pjp pna She is r/o'ed for covid infection; presumed pjp; cxr bilateral patchy focal opacities Serum CrAg negative ddx is wide though to include other OI, kaposi, mac, endemic fungi, tb. She could have more than one process Pulmonology have discussed with her and she at this time is not amenable to a bronchoscopy. We can continue current bactrim tx now; hope she gets better, but if worsening I dont' think we can avoid a bronchoscopy -continue oral bactrim tx dose -agree with kayexalate for decreasing potassium level -we could check a g6pd as well -continue prednisone pjp course of 21 days; day 1 = 1/25 -will trend procalcitonin and crp as well; will add ceftriaxone for CAP coverage as well empirically, although I worry about other OI or aids associated complication in the lungs -would be helpful with a respiratory culture but she is unable to give    #aids Hx noncompliance Current ART is triumeq vl in the past year suggest she is not taking triumeq consistently if at all 1/05 genotype without integrase strand inhibitor resistance; 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; PR mutation m46L, A71V, A82I, V77I -I think optimal ART for her would  be prezcobix/tivicay based on her genotype testing and adherence -stopped triumeq; continue prezcobix/tivicay -DDI analysis along with other meds/art Had stopped amitryptiline, celexa and started remeron 7.5 mg qhs   #hx cmv retinitis She had tx in the past; would continue maintenance therapy due to high risk of relapse with current immune status She reports chronic stable left eye blurriness; she reports her last eye exam was 08/2020 -continue valgangciclovir 450 mg renal dosing q48hours until cd4 > 100 for at least 3 months, and no ophthalmologic concern and on stable art   #oral thrush improved -continue fluconazole 400 mg po daily; plan 3 week course until 12/03/2020   #Mac prophy -continue weekly azithromycin 1200 mg; would keep as she hasn't demonstrated to be consistently taking her ART  #thrombocytopenia/anemia With her chronicly depressed cd4 <50, I would send for fungal serology and afb bcx along with ldh to complete w/u  There is potentially contribution from bactrim/valcyte but these were just started; so will keep them on and continue to monitor Serum CrAg negative -Other non-id causes being worked per primary team -continue valcyte/bactrim -  f/u urine histo ag, blasto ag, blood afb culture  #hcm A) std screen 10/27/20 rpr negative -f/u urine gc/chlam B) tb/metabolic/women's health -- defer to outpatient     [facility]  11/23/2020, 10:58 AM

## 2020-11-23 NOTE — Progress Notes (Signed)
PROGRESS NOTE  Krystal Bowen JFH:[id number] DOB: [birth year]   PCP: Hoyt Koch, MD (Inactive)  Patient is from: Home  DOA: 11/16/2020 LOS: 7  Chief complaints: Fall at home  Brief Narrative / Interim history: 50 year old female with history of HIV/AIDS (CD4 <35 and VL 67,600) CMV retinitis with retinal detachment, HTN, HLD, depression and noncompliance with medication presented on 1/25 after mechanical fall.  She was admitted for acute respiratory failure with hypoxia and sepsis presumed to be due to PJP pneumonia, oral candidiasis and AKI.  She was restarted on ART, Bactrim and azithromycin per recommendation by ID.  Prednisone added due to increased oxygen requirement.  Infectious disease, palliative medicine and pulmonology following.  Evaluated by psychiatry as well.  Subjective: Seen and examined earlier this morning.  Looks tired.  Does not want to be bothered.  She says she wanted to rest.  Does not feel like talking much.  She responds no to pain, shortness of breath, nausea, vomiting or abdominal pain.  Objective: Vitals:   11/23/20 1322 11/23/20 1327 11/23/20 1509 11/23/20 1646  BP:   (!) 142/100 (!) 155/97  Pulse: (!) 126  (!) 112 79  Resp: (!) 34   18  Temp:    98.6 F (37 C)  TempSrc:    Axillary  SpO2: 100% 100%  100%  Weight:      Height:        Intake/Output Summary (Last 24 hours) at 11/23/2020 1812 Last data filed at 11/23/2020 1600 Gross per 24 hour  Intake 3239.61 ml  Output 900 ml  Net 2339.61 ml   Filed Weights   11/16/20 1900  Weight: 34 kg    Examination:  GENERAL: Frail and chronically ill-appearing. HEENT: MMM.  Vision and hearing grossly intact.  NECK: Supple.  No apparent JVD.  RESP: 100% on 25 L / 100% FiO2.  No IWOB.  Fair aeration but limited exam. CVS:  RRR. Heart sounds normal.  ABD/GI/GU: BS+. Abd soft, NTND.  MSK/EXT:  Moves extremities.  Significant muscle mass and subcu fat loss. SKIN: no apparent skin lesion or wound NEURO:  Awake, alert and oriented appropriately.  No apparent focal neuro deficit. PSYCH: Calm.  Flat affect.  Procedures:  None  Microbiology summarized: COVID-19 PCR nonreactive. Blood cultures NGTD.  Assessment & Plan: Acute respiratory failure with hypoxia due to PJP pneumonia-currently on 25 L / 100% FiO2. Sepsis likely due to the above -PCCM following -On Bactrim for PJP pneumonia -Induced sputum for culture unless patient wants to pursue full comfort measures.  HIV/AIDS-CD4 count<35.  VL 67,600 on 10/28/2019.  Patient is noncompliant with meds. CMV retinitis/oral candidiasis -ID changed ART from 3 meg to Prezcobix and Tivicay based on genotype testing and adherence. -Continue Bactrim, Diflucan and azithromycin  Goal of care: She is DNR/DNI appropriately.  Repeatedly says "I am tired" but not quite clear if she wanted to pursue full comfort measures yet.  Per palliative medicine, his sisters think full comfort measure is to the patient's best interest. -Palliative medicine following.  Hypokalemia: K5.6.  Refused Lokelma yesterday. -Lokelma 10g x 2 -Recheck in the morning  Essential hypertension/sinus tachycardia: BP slightly elevated. -Start low-dose Metformin  Depression/anxiety: Appears to have flat affect. -Appreciate psych recommendation-citalopram 10 mg daily -No indication for inpatient psych  Microcytic anemia/thrombocytopenia: H&H relatively stable. Recent Labs    08/10/20 1542 10/27/20 1046 11/16/20 1148 11/16/20 1158 11/17/20 0532 11/18/20 0439 11/19/20 0458 11/20/20 0113 11/21/20 0037 11/22/20 0049  HGB 9.4* 10.7* 11.4* 11.9* 8.5*  8.5* 8.0* 7.8* 8.2* 8.8*   Mechanical fall -PT/OT  Severe malnutrition due to acute on chronic illness Body mass index is 14.17 kg/m. Nutrition Problem: Severe Malnutrition Etiology: chronic illness (HIV/AIDS) Signs/Symptoms: severe fat depletion,severe muscle depletion Interventions: Ensure Enlive (each supplement provides  350kcal and 20 grams of protein),Magic cup,MVI   DVT prophylaxis:  Place and maintain sequential compression device Start: 11/23/20 1813  Code Status: DNR/DNI Family Communication: Patient and/or RN. Available if any question.  Level of care: Progressive Status is: Inpatient  Remains inpatient appropriate because:Hemodynamically unstable, Persistent severe electrolyte disturbances, Ongoing diagnostic testing needed not appropriate for outpatient work up, Unsafe d/c plan, IV treatments appropriate due to intensity of illness or inability to take PO and Inpatient level of care appropriate due to severity of illness   Dispo:  Patient From: Home  Planned Disposition: Home  Expected discharge date: 11/27/2020  Medically stable for discharge: No         Consultants:  Infectious disease Psychiatry PCCM Palliative medicine   Sch Meds:  Scheduled Meds: . azithromycin  1,200 mg Oral Q Sat  . darunavir-cobicistat  1 tablet Oral Q breakfast  . dolutegravir  50 mg Oral Daily  . feeding supplement  237 mL Oral TID BM  . fluconazole  200 mg Oral Daily  . methylPREDNISolone (SOLU-MEDROL) injection  40 mg Intravenous Q12H  . metoprolol tartrate  25 mg Oral BID  . mirtazapine  7.5 mg Oral QHS  . sodium chloride flush  3 mL Intravenous Q12H  . sulfamethoxazole-trimethoprim  1 tablet Oral Q12H  . valGANciclovir  450 mg Oral Daily   Continuous Infusions: . sodium chloride 1 mL (11/22/20 2047)  . cefTRIAXone (ROCEPHIN)  IV 2 g (11/23/20 1352)   PRN Meds:.acetaminophen **OR** acetaminophen, dextromethorphan-guaiFENesin, hydrALAZINE, ipratropium-albuterol, lactulose, LORazepam, morphine CONCENTRATE **OR** morphine injection, ondansetron **OR** ondansetron (ZOFRAN) IV, polyethylene glycol, senna-docusate, sodium chloride  Antimicrobials: Anti-infectives (From admission, onward)   Start     Dose/Rate Route Frequency Ordered Stop   11/23/20 1215  cefTRIAXone (ROCEPHIN) 2 g in sodium chloride  0.9 % 100 mL IVPB        2 g 200 mL/hr over 30 Minutes Intravenous Every 24 hours 11/23/20 1116 11/30/20 1214   11/23/20 1000  valGANciclovir (VALCYTE) 450 MG tablet TABS 450 mg        450 mg Oral Daily 11/23/20 0936     11/21/20 0700  darunavir-cobicistat (PREZCOBIX) 800-150 MG per tablet 1 tablet        1 tablet Oral Daily with breakfast 11/20/20 1432     11/20/20 2200  valGANciclovir (VALCYTE) 450 MG tablet TABS 450 mg  Status:  Discontinued        450 mg Oral Every 48 hours 11/20/20 1430 11/23/20 0936   11/20/20 1700  azithromycin (ZITHROMAX) tablet 1,200 mg        1,200 mg Oral Every Sat 11/20/20 1418     11/20/20 1530  dolutegravir (TIVICAY) tablet 50 mg        50 mg Oral Daily 11/20/20 1432     11/20/20 1430  azithromycin (ZITHROMAX) tablet 1,200 mg  Status:  Discontinued        1,200 mg Oral Every Sat 11/20/20 1416 11/20/20 1418   11/19/20 2200  sulfamethoxazole-trimethoprim (BACTRIM DS) 800-160 MG per tablet 1 tablet        1 tablet Oral Every 12 hours 11/19/20 1148     11/18/20 1000  fluconazole (DIFLUCAN) tablet 200 mg  200 mg Oral Daily 11/18/20 0712     11/18/20 0900  valGANciclovir (VALCYTE) 450 MG tablet TABS 450 mg  Status:  Discontinued        450 mg Oral Once per day on Mon Thu 11/16/20 2114 11/20/20 1430   11/16/20 2200  valGANciclovir (VALCYTE) 450 MG tablet TABS 450 mg  Status:  Discontinued        450 mg Oral 2 times daily 11/16/20 1736 11/16/20 2114   11/16/20 2000  sulfamethoxazole-trimethoprim (BACTRIM) 170.08 mg in dextrose 5 % 250 mL IVPB  Status:  Discontinued        5 mg/kg  34 kg 260.6 mL/hr over 60 Minutes Intravenous Every 12 hours 11/16/20 1743 11/19/20 1148   11/16/20 1930  abacavir-dolutegravir-lamiVUDine (TRIUMEQ) 600-50-300 MG per tablet 1 tablet  Status:  Discontinued        1 tablet Oral Daily 11/16/20 1736 11/20/20 1430   11/16/20 1930  fluconazole (DIFLUCAN) tablet 400 mg  Status:  Discontinued        400 mg Oral Daily 11/16/20 1743  11/18/20 0712   11/16/20 1745  sulfamethoxazole-trimethoprim (BACTRIM) 200-40 MG/5ML suspension 20 mL  Status:  Discontinued        20 mL Oral Daily 11/16/20 1736 11/16/20 1743   11/16/20 1745  ceFEPIme (MAXIPIME) 2 g in sodium chloride 0.9 % 100 mL IVPB  Status:  Discontinued        2 g 200 mL/hr over 30 Minutes Intravenous  Once 11/16/20 1736 11/16/20 1743   11/16/20 1745  metroNIDAZOLE (FLAGYL) IVPB 500 mg  Status:  Discontinued        500 mg 100 mL/hr over 60 Minutes Intravenous Every 8 hours 11/16/20 1736 11/16/20 1842   11/16/20 1745  vancomycin (VANCOCIN) IVPB 1000 mg/200 mL premix  Status:  Discontinued        1,000 mg 200 mL/hr over 60 Minutes Intravenous  Once 11/16/20 1736 11/16/20 1743       I have personally reviewed the following labs and images: CBC: Recent Labs  Lab 11/18/20 0439 11/19/20 0458 11/20/20 0113 11/21/20 0037 11/22/20 0049  WBC 10.0 7.2 6.8 8.0 6.7  NEUTROABS  --   --   --  7.5  --   HGB 8.5* 8.0* 7.8* 8.2* 8.8*  HCT 27.3* 25.1* 25.4* 26.7* 30.2*  MCV 100.7* 99.2 100.0 100.4* 102.4*  PLT PLATELET CLUMPS NOTED ON SMEAR, UNABLE TO ESTIMATE PLATELET CLUMPS NOTED ON SMEAR, UNABLE TO ESTIMATE 69* 73* 78*   BMP &GFR Recent Labs  Lab 11/18/20 0439 11/19/20 0458 11/20/20 0113 11/21/20 0037 11/22/20 0049 11/23/20 0213  NA 134* 133* 133* 135 138 138  K 4.7 5.4* 5.5* 5.0 5.4* 5.6*  CL 104 102 102 103 103 106  CO2 23 23 26 24 27 26   GLUCOSE 135* 96 106* 124* 154* 121*  BUN 35* 27* 26* 22* 20 23*  CREATININE 1.47* 1.20* 1.22* 1.14* 1.16* 0.90  CALCIUM 8.0* 8.2* 8.2* 8.1* 8.6* 8.6*  MG 1.8 1.8 1.9 1.7 1.8  --   PHOS  --   --   --  2.9  --   --    Estimated Creatinine Clearance: 40.6 mL/min (by C-G formula based on SCr of 0.9 mg/dL). Liver & Pancreas: Recent Labs  Lab 11/21/20 0037  ALBUMIN 1.5*   No results for input(s): LIPASE, AMYLASE in the last 168 hours. No results for input(s): AMMONIA in the last 168 hours. Diabetic: No results for  input(s): HGBA1C in the last 72  hours. Recent Labs  Lab 11/22/20 1544 11/22/20 2000 11/23/20 0736 11/23/20 1151 11/23/20 1642  GLUCAP 107* 148* 124* 131* 134*   Cardiac Enzymes: No results for input(s): CKTOTAL, CKMB, CKMBINDEX, TROPONINI in the last 168 hours. No results for input(s): PROBNP in the last 8760 hours. Coagulation Profile: No results for input(s): INR, PROTIME in the last 168 hours. Thyroid Function Tests: No results for input(s): TSH, T4TOTAL, FREET4, T3FREE, THYROIDAB in the last 72 hours. Lipid Profile: No results for input(s): CHOL, HDL, LDLCALC, TRIG, CHOLHDL, LDLDIRECT in the last 72 hours. Anemia Panel: Recent Labs    11/21/20 0037  VITAMINB12 300  FOLATE 4.8*   Urine analysis:    Component Value Date/Time   COLORURINE YELLOW 08/26/2018 0020   APPEARANCEUR HAZY (A) 08/26/2018 0020   LABSPEC 1.017 08/26/2018 0020   PHURINE 6.0 08/26/2018 0020   GLUCOSEU NEGATIVE 08/26/2018 0020   HGBUR NEGATIVE 08/26/2018 0020   BILIRUBINUR NEGATIVE 08/26/2018 0020   KETONESUR NEGATIVE 08/26/2018 0020   PROTEINUR NEGATIVE 08/26/2018 0020   UROBILINOGEN 0.2 06/25/2014 2152   NITRITE POSITIVE (A) 08/26/2018 0020   LEUKOCYTESUR TRACE (A) 08/26/2018 0020   Sepsis Labs: Invalid input(s): PROCALCITONIN, LACTICIDVEN  Microbiology: Recent Results (from the past 240 hour(s))  SARS Coronavirus 2 by RT PCR (hospital order, performed in [facility] hospital lab) Nasopharyngeal Nasopharyngeal Swab     Status: None   Collection Time: 11/16/20 10:06 AM   Specimen: Nasopharyngeal Swab  Result Value Ref Range Status   SARS Coronavirus 2 NEGATIVE NEGATIVE Final    Comment: (NOTE) SARS-CoV-2 target nucleic acids are NOT DETECTED.  The SARS-CoV-2 RNA is generally detectable in upper and lower respiratory specimens during the acute phase of infection. The lowest concentration of SARS-CoV-2 viral copies this assay can detect is 250 copies / mL. A negative result does not  preclude SARS-CoV-2 infection and should not be used as the sole basis for treatment or other patient management decisions.  A negative result may occur with improper specimen collection / handling, submission of specimen other than nasopharyngeal swab, presence of viral mutation(s) within the areas targeted by this assay, and inadequate number of viral copies (<250 copies / mL). A negative result must be combined with clinical observations, patient history, and epidemiological information.  Fact Sheet for Patients:   BoilerBrush.com.cy  Fact Sheet for Healthcare Providers: https://pope.com/  This test is not yet approved or  cleared by the [place] FDA and has been authorized for detection and/or diagnosis of SARS-CoV-2 by FDA under an Emergency Use Authorization (EUA).  This EUA will remain in effect (meaning this test can be used) for the duration of the COVID-19 declaration under Section 564(b)(1) of the Act, 21 U.S.C. section 360bbb-3(b)(1), unless the authorization is terminated or revoked sooner.  Performed at [facility], [address], [place], [place] [address]   Culture, blood (Routine X 2) w Reflex to ID Panel     Status: None   Collection Time: 11/17/20 11:11 AM   Specimen: BLOOD RIGHT ARM  Result Value Ref Range Status   Specimen Description BLOOD RIGHT ARM  Final   Special Requests   Final    BOTTLES DRAWN AEROBIC ONLY Blood Culture results may not be optimal due to an inadequate volume of blood received in culture bottles   Culture   Final    NO GROWTH 5 DAYS Performed at [facility], [name] [place], [place] [address]    Report Status 11/22/2020 FINAL  Final  Culture, blood (Routine X 2) w Reflex to ID Panel     Status: None   Collection Time: 11/17/20 11:11 AM   Specimen: BLOOD LEFT HAND  Result Value Ref Range Status   Specimen Description BLOOD LEFT HAND  Final   Special Requests    Final    BOTTLES DRAWN AEROBIC AND ANAEROBIC Blood Culture results may not be optimal due to an inadequate volume of blood received in culture bottles   Culture   Final    NO GROWTH 5 DAYS Performed at [facility], [address], [place], [place] [address]    Report Status 11/22/2020 FINAL  Final    Radiology Studies: No results found.   Taye T. [facility]  If 7PM-7AM, please contact night-coverage [email or web address] 11/23/2020, 6:12 PM

## 2020-11-23 NOTE — Progress Notes (Signed)
NAME:  Krystal Bowen, MRN:  [id number], DOB:  [birth year], LOS: 7 ADMISSION DATE:  11/16/2020, CONSULTATION DATE:  11/22/2020 REFERRING MD:  Dr. Myriam Forehand, CHIEF COMPLAINT:  Hypoxic respiratory failure  Brief History:  17 yoF with hx of HIV/ AID presenting after mechanical fall found to be hypoxic with patchy multifocal inflitrates.  Admitted to [facility] with ID consulting, treating for presumed PJP, however patient has failed to improve despite empiric abx and steroids.  PCCM consulted for possible diagnostic FOB.  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 Extensive patchy ground-glass opacity with scattered mild interlobular septal thickening throughout the remaining lungs. Mildly thick walled and mildly irregular pulmonary cysts scattered in both lungs, predominantly in the upper lobes. Findings are compatible with multilobar opportunistic pneumonia, favoring pneumocystis pneumonia given the cystic changes in the lungs.  Micro Data:  1/25 SARS >> neg 1/26 BCx2 >> ngtd  1/05 RPR >> negative 1/29 blastomyces ag >> 1/29 urine histo ag >> needs to be collected  1/29 urine gc/chlam >> needs to be collected 1/29 serum cryptococcal ag >> negative 1/29 mycobaterial BC >>  Antimicrobials:  1/25 bactrim >> (dose increased 1/28) >> 1/25 fluconazole >> 1/29 azithromycin (weekly 1200mg )  1/28 prednisone >>solumedrol 2/1  Interim History / Subjective:  No distress Objective   Blood pressure (Abnormal) 137/103, pulse 93, temperature 98.6 F (37 C), temperature  source Axillary, resp. rate (Abnormal) 22, height 5\' 1"  (1.549 m), weight 34 kg, SpO2 100 %.    FiO2 (%):  [100 %] 100 %   Intake/Output Summary (Last 24 hours) at 11/23/2020 1000 Last data filed at 11/23/2020 0700 Gross per 24 hour  Intake no documentation  Output 900 ml  Net -900 ml   Filed Weights   11/16/20 1900  Weight: 34 kg   Examination: General frail cachectic 50 year old female no distress HENT MMM + temporal wasting pulm crackles bases 100% high flow Card rrr abd soft Neuro intact Ext warm no edema   Resolved Hospital Problem list   AKI   Assessment & Plan:   Acute hypoxic respiratory failure secondary to presumed PJP pneumonia, however ddx remain wide given immunocompromised host Sepsis due to undetermined organism  HIV/ AIDS  (10/27/2020 CD4 < 35, LV 67,600)->changed to prezcobix/tivicay per ID Hx of medical non compliance  Oral candidiasis: on oral fluconazole for planned 3 weeks (dc 2/11) 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 ->On Valgangciclovir per ID HTN- holding home HCTZ Severe malnutrition, BMI 14.17 kg/m Depression/ anxiety  Macrocytic anemia -Hgb stable Thrombocytopenia - slowly improving Mechanical fall - PT/ OT, imaging negative  Pulmonary problem list: Acute hypoxic respiratory failure due extensive bilateral groundglass opacities and consolidations in a patient with AIDS/immunocompromise state (CD4 count less than 35) -Given oxygen requirement she remains high risk for intubation where we could consider diagnostic bronchoscopy -Patient is currently working with palliative care to determine goals of care Plan Follow-up induced sputum for PCP Continue Bactrim Continue Solu-Medrol 40 every 12 Valganciclovir per infectious disease  Already being followed by ID, we will be available as needed  Best practice (evaluated daily)  Diet: per primary  Pain/Anxiety/Delirium protocol (if indicated): n/a VAP protocol (if indicated):  n/a DVT prophylaxis: heparin SQ GI prophylaxis: n/a Glucose control: trend on BMET Mobility: PT/ OT Disposition: PCU  Goals of Care:  Code Status: - currently patient DNR/ DNI as of 1/25, PMT following.    Simonne Martinet ACNP-BC [facility] Pulmonary/Critical Care Pager # [phone] OR # [phone] if no answer\

## 2020-11-23 NOTE — Progress Notes (Signed)
PHARMACY NOTE:  ANTIMICROBIAL RENAL DOSAGE ADJUSTMENT  Current antimicrobial regimen includes a mismatch between antimicrobial dosage and estimated renal function.  As per policy approved by the Pharmacy & Therapeutics and Medical Executive Committees, the antimicrobial dosage will be adjusted accordingly.  Current antimicrobial dosage: Valganciclovir  450 mg q 48 hours   Indication: HX of CMV retinitis   Renal Function:  Estimated Creatinine Clearance: 40.6 mL/min (by C-G formula based on SCr of 0.9 mg/dL). []      On intermittent HD, scheduled: []      On CRRT    Antimicrobial dosage has been changed to:  Valganciclovir 450 Q 24 hours  Additional comments:   Thank you for allowing pharmacy to be a part of this patient's care.  , PharmD, BCPS, BCIDP Infectious Diseases Clinical Pharmacist Phone: [phone] 11/23/2020 9:58 AM

## 2020-11-23 NOTE — Progress Notes (Signed)
PT Cancellation Note  Patient Details Name: Krystal Bowen MRN: [id number] DOB: 1971-08-01   Cancelled Treatment:    Reason Eval/Treat Not Completed: Medical issues which prohibited therapy  Patient with resting RR 27 which increased to 41 with talking. She politely refused stating she just wanted to rest. Noted Palliative Care is involved. Will follow.    Jerolyn Center, PT Pager [phone]   Krystal Bowen 11/23/2020, 2:20 PM

## 2020-11-24 DIAGNOSIS — B259 Cytomegaloviral disease, unspecified: Secondary | ICD-10-CM | POA: Diagnosis not present

## 2020-11-24 DIAGNOSIS — J181 Lobar pneumonia, unspecified organism: Secondary | ICD-10-CM

## 2020-11-24 DIAGNOSIS — J9601 Acute respiratory failure with hypoxia: Secondary | ICD-10-CM | POA: Diagnosis not present

## 2020-11-24 DIAGNOSIS — B20 Human immunodeficiency virus [HIV] disease: Secondary | ICD-10-CM | POA: Diagnosis not present

## 2020-11-24 DIAGNOSIS — A419 Sepsis, unspecified organism: Secondary | ICD-10-CM | POA: Diagnosis not present

## 2020-11-24 DIAGNOSIS — D696 Thrombocytopenia, unspecified: Secondary | ICD-10-CM

## 2020-11-24 LAB — RENAL FUNCTION PANEL
Albumin: 1.5 g/dL — ABNORMAL LOW (ref 3.5–5.0)
Anion gap: 9 (ref 5–15)
BUN: 25 mg/dL — ABNORMAL HIGH (ref 6–20)
CO2: 24 mmol/L (ref 22–32)
Calcium: 8.6 mg/dL — ABNORMAL LOW (ref 8.9–10.3)
Chloride: 105 mmol/L (ref 98–111)
Creatinine, Ser: 0.99 mg/dL (ref 0.44–1.00)
GFR, Estimated: >60 mL/min (ref >60)
Glucose, Bld: 122 mg/dL — ABNORMAL HIGH (ref 70–99)
Phosphorus: 2.6 mg/dL (ref 2.5–4.6)
Potassium: 5.5 mmol/L — ABNORMAL HIGH (ref 3.5–5.1)
Sodium: 138 mmol/L (ref 135–145)

## 2020-11-24 LAB — BLASTOMYCES ANTIGEN: Blastomyces Antigen: NOT DETECTED ng/mL

## 2020-11-24 LAB — MAGNESIUM: Magnesium: 1.8 mg/dL (ref 1.7–2.4)

## 2020-11-24 LAB — CBC
HCT: 22.8 % — ABNORMAL LOW (ref 36.0–46.0)
Hemoglobin: 7.2 g/dL — ABNORMAL LOW (ref 12.0–15.0)
MCH: 32 pg (ref 26.0–34.0)
MCHC: 31.6 g/dL (ref 30.0–36.0)
MCV: 101.3 fL — ABNORMAL HIGH (ref 80.0–100.0)
Platelets: 136 10*3/uL — ABNORMAL LOW (ref 150–400)
RBC: 2.25 MIL/uL — ABNORMAL LOW (ref 3.87–5.11)
RDW: 17 % — ABNORMAL HIGH (ref 11.5–15.5)
WBC: 7.5 10*3/uL (ref 4.0–10.5)
nRBC: 0 % (ref 0.0–0.2)

## 2020-11-24 LAB — GLUCOSE 6 PHOSPHATE DEHYDROGENASE
G6PDH: 10.9 U/g{Hb} (ref 4.7–14.6)
Hemoglobin: 7.7 g/dL — ABNORMAL LOW (ref 11.1–15.9)

## 2020-11-24 LAB — GLUCOSE, CAPILLARY: Glucose-Capillary: 164 mg/dL — ABNORMAL HIGH (ref 70–99)

## 2020-11-24 MED ORDER — SODIUM ZIRCONIUM CYCLOSILICATE 10 G PO PACK
10.0000 g | PACK | Freq: Once | ORAL | Status: AC
  Administered 2020-11-24: 10 g via ORAL
  Filled 2020-11-24: qty 1

## 2020-11-24 MED ORDER — MORPHINE SULFATE (CONCENTRATE) 10 MG/0.5ML PO SOLN: 2.5000 mg | ORAL | Status: DC | PRN

## 2020-11-24 MED ORDER — DARUN-COBIC-EMTRICIT-TENOFAF 800-150-200-10 MG PO TABS
1.0000 | ORAL_TABLET | Freq: Every day | ORAL | Status: DC
  Administered 2020-11-25 – 2020-11-27 (×2): 1 via ORAL
  Filled 2020-11-24 (×4): qty 1

## 2020-11-24 MED ORDER — AMLODIPINE BESYLATE 5 MG PO TABS
5.0000 mg | ORAL_TABLET | Freq: Every day | ORAL | Status: DC
  Administered 2020-11-24 – 2020-11-27 (×3): 5 mg via ORAL
  Filled 2020-11-24 (×4): qty 1

## 2020-11-24 MED ORDER — MORPHINE SULFATE (PF) 2 MG/ML IV SOLN
1.0000 mg | INTRAVENOUS | Status: DC | PRN
  Administered 2020-11-24 – 2020-11-26 (×6): 2 mg via INTRAVENOUS
  Filled 2020-11-24 (×7): qty 1

## 2020-11-24 MED ORDER — LORAZEPAM 2 MG/ML IJ SOLN
0.5000 mg | INTRAMUSCULAR | Status: DC | PRN
  Administered 2020-11-24: 0.5 mg via INTRAVENOUS
  Filled 2020-11-24: qty 1

## 2020-11-24 MED ORDER — SULFAMETHOXAZOLE-TRIMETHOPRIM 400-80 MG/5ML IV SOLN
160.0000 mg | Freq: Two times a day (BID) | INTRAVENOUS | Status: DC
  Filled 2020-11-24: qty 10

## 2020-11-24 MED ORDER — MORPHINE SULFATE (PF) 2 MG/ML IV SOLN: 1.0000 mg | INTRAVENOUS | Status: DC | PRN

## 2020-11-24 MED ORDER — SULFAMETHOXAZOLE-TRIMETHOPRIM 400-80 MG/5ML IV SOLN
320.0000 mg | Freq: Two times a day (BID) | INTRAVENOUS | Status: DC
  Administered 2020-11-24 – 2020-11-25 (×2): 320 mg via INTRAVENOUS
  Filled 2020-11-24 (×3): qty 20

## 2020-11-24 NOTE — Progress Notes (Addendum)
Palliative:  HPI:50 y.o.femalewith past medical history of HTN, HLD, retinal detachment with CMV retinitis, severe depression, HIV/AIDSadmitted on 1/25/2022with a fall. Found to have acute hypoxia from multifocal pneumonia - thought to be severe PJP pna.Also found to have oral candidiasis - on daily fluconazole for 3 weeks. Ms Denzler also has a history of not taking her medications as prescribed. Ms Shiller has had difficulty with desaturation 1/29 with oxygen requirements up to 15 L. Patient has also been bothered by anxiety and panic attacks. Seen by psych - citalopram daily recommended. PMT consulted to assist with symptom management.  I met today with Ms. Loring along with her youngest daughter at bedside. Ms. Gintz is awake and alert and continues to be fatigued and miserable. RN reports labored breathing and also reports that Ms. Janvrin has talked about having "medicine to make me comfortable." She continues to need significant convincing to take her medication.   I spoke with Ms. Sherryll Burger and discussed with her plan to meet tomorrow with her children to discuss her care and answer their questions so we can all be on the same page on how to move forward. Ms. Suman agrees that this is a good idea and gives permission for [place] to discuss with them. Ms. Whittington then asks if she can have medication "to just help me sleep." I discussed with her that I notice her breathing is labored and we can give her some medication to assist with relief of breathing that will also likely help her to sleep. Daughter at bedside agrees and requests to proceed with medication. I discussed with Ms. Manthe that I will ensure she has medication available for breathing, anxiety, and sleep to assist in her comfort and she thanks me.   All questions/concerns addressed. Daughter has no questions presently but looks forward to meeting along with her siblings for further discussion. Emotional support provided.   I discussed via  Epic chat with Dr. Cyndia Skeeters and Dr. Gale Journey. I explained that Ms. Dilley has been clear with goals with my conversation yesterday and in all her other actions, verbalizations, and requests during my visits. Her mother and sister believe this is her wish. RN reports she has expressed desire for comfort to her. My colleague, Wadie Lessen, to meet with children tomorrow and 12 noon and will engage with Ms. Rebuck to confirm desire for comfort. I do not feel that it will be beneficial to continue to ask Ms. Vanscyoc her wishes repeatedly as she is already expressing frustration with all the many visits and discussions from providers and my conversation with her has to be focused and short in order to obtain answers from her. I am trying to also gain some rapport with Ms. Melkonian by assisting with symptom management to give her relief and comfort. When I have asked Ms. Hipwell about comfort care I did confirm her desire and ensured that she was aware that her life would be limited and she still confirmed her wish was to stop making her take medication, lab sticks, and just let her rest and be comfortable in multiple ways at the time. This will be clarified with patient and family tomorrow.   Exam: Awake, answers questions but not very conversant. Cachectic. Breathing slightly labored at rest with tachypnea. Anxiety appears controlled currently. Abd flat.   Plan: - Family meeting with family tomorrow 2/3 12 noon to explained situation. Discussed case fully with Wadie Lessen, NP who will lead family meeting.  - Desire for aggressiveness of care  vs comfort care to be determined with patient in presence of family tomorrow.  - If Ms. Hollern desires to continue her medications this will need to be further addressed because she continues to try and refuse medication per RN. When I asked Ms. Rockholt her wishes while explaining that it is okay to choose not to take meds and to focus on comfort then she expressed to me she did want  comfort.  - Appreciate all the support from the team in this complicated case. We all just want to support Ms. Nam according to her wishes.   Shannon, NP Palliative Medicine Team Pager [phone] (Please see [email or web address] for schedule) Team Phone [phone]    Greater than 50%  of this time was spent counseling and coordinating care related to the above assessment and plan

## 2020-11-24 NOTE — Plan of Care (Signed)
  Problem: Education: Goal: Knowledge of General Education information will improve Description Including pain rating scale, medication(s)/side effects and non-pharmacologic comfort measures Outcome: Progressing   

## 2020-11-24 NOTE — Progress Notes (Signed)
[facility] for Infectious Disease    Date of Admission:  11/16/2020   Total days of antibiotics 3   ID: Krystal Bowen is a 50 y.o. female with  Principal Problem:   Sepsis due to undetermined organism with acute respiratory failure ([place]) Active Problems:   HIV disease ([place])   HTN (hypertension)   Dyslipidemia   Depression   Noncompliance w/medication treatment due to intermit use of medication   AIDS (acquired immune deficiency syndrome) ([place])   Thrush   CMV retinitis ([place])   Unspecified severe protein-calorie malnutrition ([place])   Protein-calorie malnutrition, severe   Adjustment disorder with mixed anxiety and depressed mood    Subjective: o2 down to 25L from 30L @ 100% Ongoing goals of care discussion  No other complaint Afebrile Platelet improving  ROS: 12 point ros is negative, except for fatigue  Medications:  . darunavir-cobicistat  1 tablet Oral Q breakfast  . dolutegravir  50 mg Oral Daily  . feeding supplement  237 mL Oral TID BM  . fluconazole  200 mg Oral Daily  . methylPREDNISolone (SOLU-MEDROL) injection  40 mg Intravenous Q12H  . metoprolol tartrate  25 mg Oral BID  . mirtazapine  7.5 mg Oral QHS  . sodium chloride flush  3 mL Intravenous Q12H  . valGANciclovir  450 mg Oral Daily    Objective: Vital signs in last 24 hours: Temp:  [97.4 F (36.3 C)-98.6 F (37 C)] 98.6 F (37 C) (02/02 0750) Pulse Rate:  [77-126] 100 (02/02 0750) Resp:  [18-34] 21 (02/02 0750) BP: (134-155)/(89-103) 155/94 (02/02 0750) SpO2:  [86 %-100 %] 86 % (02/02 0922) FiO2 (%):  [70 %-100 %] 100 % (02/02 0931)  o2 11 liters [place]  Physical Exam  Constitutional:  No distress; conversant; chronically ill appearing/thin HEENT: conj clear; eomi; oropharynx is clear and moist Cardiovascular: rrr no mrg Lungs mild rales; normal respiratory effort Neck = supple, no nuchal rigidity Abdominal: Soft/nt Neurological: nonfocal Skin: no rash   Lab Results Recent Labs     11/22/20 0049 11/23/20 0213 11/24/20 0420  WBC 6.7  --  7.5  HGB 8.8*  --  7.2*  HCT 30.2*  --  22.8*  NA 138 138 138  K 5.4* 5.6* 5.5*  CL 103 106 105  CO2 _0 BUN 20 23* 25*  CREATININE 1.16* 0.90 0.99   Liver Panel Recent Labs    11/24/20 0420  ALBUMIN 1.5*   Sedimentation Rate No results for input(s): ESRSEDRATE in the last 72 hours. C-Reactive Protein Recent Labs    11/23/20 1127  CRP 2.2*    Serology: Lab Results  Component Value Date   HIV1RNAQUANT 67,600 (H) 10/27/2020   HIV1RNAQUANT 32,500 (H) 08/10/2020   HIV1RNAQUANT 279,000 (H) 06/08/2020   Lab Results  Component Value Date   CD4TABS <35 (L) 10/27/2020   CD4TABS <35 (L) 08/10/2020   CD4TABS <35 (L) 06/08/2020   Pending pjp sputum smear 1/29 blastomyces ag ip 1/29 urine histo ag needs to be collected 1/29 urine gc/chlam needs to be collected 1/29 serum cryptococcal ag negative 1/05 rpr negative   Microbiology: 1/29 afb blood culture 1/26 bcx negative  Studies/Results: 1/25 cxr I reviewed Bilateral multifocal patchy opacity Widespread airspace opacity bilaterally, concerning for multifocal pneumonia, likely of atypical organism etiology. Differential considerations must include noncardiogenic pulmonary edema and widespread aspiration.   Heart size normal.   1/31 chest ct Patchy consolidation with air bronchograms throughout the bilateral lower lobes. Extensive  patchy ground-glass opacity with scattered mild interlobular septal thickening throughout the remaining lungs. Mildly thick walled and mildly irregular pulmonary cysts scattered in both lungs, predominantly in the upper lobes. Findings are compatible with multilobar opportunistic pneumonia, favoring pneumocystis pneumonia given the cystic changes in the lungs.    Assessment/Plan: 50 yo female with aids, poor compliance, cmv retinitis, admitted 1/25 after a GLF and right hip injury, found to have hypoxemic  respiratory failure (60% RA at presentation) concerned for pjp pna, also with oral candidiasis  2/02 assessment No improvement from respiratory standpoint despite pjp tx/steroid empirically. O2 requirement high around 25-30 liters hfnc 100%fio2 Ongoing goals of care discussion Question if she has been agreeable to taking all PO meds here. Will optimize and focus on most urgent meds only  #hypoxemic resp failure #severe pjp pna Negative covid pcr, CrAg Presumed pjp; unable to get sputum/diagnostics; she is dnr/dni at this time and refused bronch  ddx is wide though to include other OI, kaposi, mac, endemic fungi, tb. 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; PR mutation m46L, A71V, A82I, V77I I think optimal ART for her would be prezcobix/tivicay based on her genotype testing and adherence -stopped triumeq; continue prezcobix/tivicay -DDI analysis along with other meds/art Had stopped amitryptiline, celexa and started remeron 7.5 mg qhs   #hx cmv retinitis She had tx in the past; would continue maintenance therapy due to high risk of relapse with current immune status She reports chronic stable left eye blurriness; she reports her last eye exam was 08/2020 -continue valgangciclovir 450 mg renal dosing q48hours until cd4 > 100 for at least 3 months, and no ophthalmologic concern and on stable art   #mac prophy #other OI #oral thrush #hx cmv retinitis  -continue fluconazole 400 mg  po daily; plan 3 week course until 12/03/2020 -stop azithromycin to avoid burden of pills/gi side effect and focus on more urgent meds - has thrombocytopenia/anemia and had sent fungal serology/blood afb cx which are pending; serum crypto Ag negative; thrombocytopenia is improving -continue valcyte secondary prophy cmv retinitis -f/u urine histo ag, blasto ag, blood afb culture  #hcm A) std screen 10/27/20 rpr negative -f/u urine gc/chlam B) tb/metabolic/women's health -- defer to outpatient     [place] [facility] for Infectious Diseases  11/24/2020, 12:57 PM

## 2020-11-24 NOTE — Progress Notes (Signed)
PHARMACY NOTE:  ANTIMICROBIAL RENAL DOSAGE ADJUSTMENT  Current antimicrobial regimen includes a mismatch between antimicrobial dosage and estimated renal function.  As per policy approved by the Pharmacy & Therapeutics and Medical Executive Committees, the antimicrobial dosage will be adjusted accordingly.  Current antimicrobial dosage:  Bactrim 160  mg every 12 hours TMP component   Indication: PJP pneumonia   Renal Function:  Estimated Creatinine Clearance: 36.9 mL/min (by C-G formula based on SCr of 0.99 mg/dL). []      On intermittent HD, scheduled: []      On CRRT    Antimicrobial dosage has been changed to:  Bactrim 320 mg every 12 hours TMP component   Additional comments: Increasing dose with improved renal function- Will monitor K and SCr closely    Thank you for allowing pharmacy to be a part of this patient's care.  , PharmD, BCPS, BCIDP Infectious Diseases Clinical Pharmacist Phone: [phone] 11/24/2020 11:44 AM

## 2020-11-24 NOTE — Progress Notes (Signed)
PROGRESS NOTE  Krystal Bowen GNF:[id number] DOB: 07/05/1971   PCP: Hoyt Koch, MD (Inactive)  Patient is from: Home  DOA: 11/16/2020 LOS: 8  Chief complaints: Fall at home  Brief Narrative / Interim history: 50 year old female with history of HIV/AIDS (CD4 <35 and VL 67,600) CMV retinitis with retinal detachment, HTN, HLD, depression and noncompliance with medication presented on 1/25 after mechanical fall.  She was admitted for acute respiratory failure with hypoxia and sepsis presumed to be due to PJP pneumonia, oral candidiasis and AKI.  She was restarted on ART, Bactrim and azithromycin per recommendation by ID.  Prednisone added due to increased oxygen requirement. Evaluated by psychiatry who recommended SSRI but no indication for inpatient psych. Infectious disease, palliative medicine and pulmonology following.    Subjective: Seen and examined earlier this morning. Feels better this morning. She says her breathing is better. She denies chest pain or abdominal pain.  When asked to clarify what she meant when she said tired yesterday, she says she didn't mean stop treatment. She says she was physically tired and fatigued but wanted to continue treatment for HIV and other illnesses. She confirms DNR/DNI. She also sees her father and mother should be medical decision makers when she is not able to.  Objective: Vitals:   11/24/20 0724 11/24/20 0750 11/24/20 0922 11/24/20 1340  BP:  (!) 155/94    Pulse: 84 100  89  Resp: 20 (!) 21  (!) 25  Temp:  98.6 F (37 C)    TempSrc:  Axillary    SpO2: 99% 95% (!) 86% 100%  Weight:      Height:        Intake/Output Summary (Last 24 hours) at 11/24/2020 1409 Last data filed at 11/23/2020 2034 Gross per 24 hour  Intake 3239.61 ml  Output 1100 ml  Net 2139.61 ml   Filed Weights   11/16/20 1900  Weight: 34 kg    Examination:  GENERAL: Frail and chronically ill-appearing. Nontoxic. HEENT: MMM.  Vision and hearing grossly intact.   NECK: Supple.  No apparent JVD.  RESP: 95% on 25 L / 70%. No IWOB.  Fair aeration bilaterally. CVS:  RRR. Heart sounds normal.  ABD/GI/GU: BS+. Abd soft, NTND.  MSK/EXT:  Moves extremities. Significant muscle mass and subcu fat loss. SKIN: no apparent skin lesion or wound NEURO: Awake. Oriented appropriately. No apparent focal neuro deficit. PSYCH: Calm. No distress or agitation.  Procedures:  None  Microbiology summarized: COVID-19 PCR nonreactive. Blood cultures NGTD. Serum cryptococcal antigen negative.  Assessment & Plan: Acute respiratory failure with hypoxia due to PJP pneumonia-currently 95% on 25 L /70% % FiO2. Sepsis likely due to the above -PCCM following - continue Solumedrol and duonebs   -Patient is DNR/DNI. -ID started ceftriaxone on 2/1 -Follow sputum PJP smear  HIV/AIDS-CD4 count<35.  VL 67,600 on 10/28/2019.  Noncompliant with meds. RPR and serum cryptococcal antigen negative. CMV retinitis/oral candidiasis -ID changed ART from 3 meg to Prezcobix and Tivicay based on genotype testing and adherence. -Continue Bactrim and Diflucan for PPX  Hypokalemia: K5.5. Received only a dose of Lokelma yesterday. -Lokelma 10g x 1 today. -Recheck in the morning  Essential hypertension/sinus tachycardia: BP slightly elevated. -Continue metoprolol 25 mg twice daily -Stop IV fluid  Depression/anxiety: No SI or HI. -Appreciate psych recommendation-citalopram 10 mg daily which has been changed to Remeron since then. -No indication for inpatient psych  Microcytic anemia/thrombocytopenia: Slight drop in hemoglobin likely dilutional. Recent Labs    10/27/20 1046 11/16/20  1148 11/16/20 1158 11/17/20 0532 11/18/20 0439 11/19/20 0458 11/20/20 0113 11/21/20 0037 11/22/20 0049 11/24/20 0420  HGB 10.7* 11.4* 11.9* 8.5* 8.5* 8.0* 7.8* 8.2* 8.8* 7.2*  -Monitor H&H -Check anemia panel -Stop IV fluid  Mechanical fall -PT/OT  Thrombocytopenia: Platelet  136. -Improved.  Goal of care: She is DNR/DNI appropriately. Otherwise, she wished to continue current treatment for HIV and respiratory failure. She says she is not ready for comfort measures only, although this doesn't seems to be her conversation with palliative medicine. She also identified her mother and father as Social research officer, government when she is not able to. I talked to her mother who is planning to visit her tomorrow to discuss and clarify her wishes.  -Palliative medicine following.   Severe malnutrition due to acute on chronic illness Wt Readings from Last 10 Encounters:  11/16/20 34 kg  11/02/20 34 kg  10/27/20 36.2 kg  08/10/20 39.9 kg  06/08/20 40.8 kg  11/13/19 45.6 kg  10/02/19 49 kg  05/15/19 49.4 kg  02/18/19 48.5 kg  01/21/19 48.5 kg   Body mass index is 14.17 kg/m. Nutrition Problem: Severe Malnutrition Etiology: chronic illness (HIV/AIDS) Signs/Symptoms: severe fat depletion,severe muscle depletion Interventions: Ensure Enlive (each supplement provides 350kcal and 20 grams of protein),Magic cup,MVI   DVT prophylaxis:  Place and maintain sequential compression device Start: 11/23/20 1813  Code Status: DNR/DNI Family Communication: Updated patient's mother over the phone Level of care: Progressive Status is: Inpatient  Remains inpatient appropriate because:Hemodynamically unstable, Persistent severe electrolyte disturbances, Ongoing diagnostic testing needed not appropriate for outpatient work up, Unsafe d/c plan, IV treatments appropriate due to intensity of illness or inability to take PO and Inpatient level of care appropriate due to severity of illness   Dispo:  Patient From: Home  Planned Disposition: Home  Expected discharge date: 11/27/2020  Medically stable for discharge: No         Consultants:  Infectious disease Psychiatry PCCM Palliative medicine   Sch Meds:  Scheduled Meds: . amLODipine  5 mg Oral Daily  . darunavir-cobicistat   1 tablet Oral Q breakfast  . dolutegravir  50 mg Oral Daily  . feeding supplement  237 mL Oral TID BM  . fluconazole  200 mg Oral Daily  . methylPREDNISolone (SOLU-MEDROL) injection  40 mg Intravenous Q12H  . metoprolol tartrate  25 mg Oral BID  . mirtazapine  7.5 mg Oral QHS  . sodium chloride flush  3 mL Intravenous Q12H  . valGANciclovir  450 mg Oral Daily   Continuous Infusions: . sodium chloride 1 mL (11/24/20 0130)  . cefTRIAXone (ROCEPHIN)  IV 2 g (11/24/20 0922)  . sulfamethoxazole-trimethoprim     PRN Meds:.acetaminophen **OR** acetaminophen, dextromethorphan-guaiFENesin, hydrALAZINE, ipratropium-albuterol, lactulose, LORazepam, morphine CONCENTRATE **OR** morphine injection, ondansetron **OR** ondansetron (ZOFRAN) IV, polyethylene glycol, senna-docusate, sodium chloride  Antimicrobials: Anti-infectives (From admission, onward)   Start     Dose/Rate Route Frequency Ordered Stop   11/24/20 2200  sulfamethoxazole-trimethoprim (BACTRIM) 160 mg in dextrose 5 % 250 mL IVPB  Status:  Discontinued        160 mg 260 mL/hr over 60 Minutes Intravenous Every 12 hours 11/24/20 0935 11/24/20 1143   11/24/20 2200  sulfamethoxazole-trimethoprim (BACTRIM) 320 mg in dextrose 5 % 500 mL IVPB        320 mg 346.7 mL/hr over 90 Minutes Intravenous Every 12 hours 11/24/20 1143     11/23/20 1215  cefTRIAXone (ROCEPHIN) 2 g in sodium chloride 0.9 % 100 mL IVPB  2 g 200 mL/hr over 30 Minutes Intravenous Every 24 hours 11/23/20 1116 11/30/20 0959   11/23/20 1000  valGANciclovir (VALCYTE) 450 MG tablet TABS 450 mg        450 mg Oral Daily 11/23/20 0936     11/21/20 0700  darunavir-cobicistat (PREZCOBIX) 800-150 MG per tablet 1 tablet        1 tablet Oral Daily with breakfast 11/20/20 1432     11/20/20 2200  valGANciclovir (VALCYTE) 450 MG tablet TABS 450 mg  Status:  Discontinued        450 mg Oral Every 48 hours 11/20/20 1430 11/23/20 0936   11/20/20 1700  azithromycin (ZITHROMAX) tablet  1,200 mg  Status:  Discontinued        1,200 mg Oral Every Sat 11/20/20 1418 11/24/20 0907   11/20/20 1530  dolutegravir (TIVICAY) tablet 50 mg        50 mg Oral Daily 11/20/20 1432     11/20/20 1430  azithromycin (ZITHROMAX) tablet 1,200 mg  Status:  Discontinued        1,200 mg Oral Every Sat 11/20/20 1416 11/20/20 1418   11/19/20 2200  sulfamethoxazole-trimethoprim (BACTRIM DS) 800-160 MG per tablet 1 tablet  Status:  Discontinued        1 tablet Oral Every 12 hours 11/19/20 1148 11/24/20 0935   11/18/20 1000  fluconazole (DIFLUCAN) tablet 200 mg        200 mg Oral Daily 11/18/20 0712     11/18/20 0900  valGANciclovir (VALCYTE) 450 MG tablet TABS 450 mg  Status:  Discontinued        450 mg Oral Once per day on Mon Thu 11/16/20 2114 11/20/20 1430   11/16/20 2200  valGANciclovir (VALCYTE) 450 MG tablet TABS 450 mg  Status:  Discontinued        450 mg Oral 2 times daily 11/16/20 1736 11/16/20 2114   11/16/20 2000  sulfamethoxazole-trimethoprim (BACTRIM) 170.08 mg in dextrose 5 % 250 mL IVPB  Status:  Discontinued        5 mg/kg  34 kg 260.6 mL/hr over 60 Minutes Intravenous Every 12 hours 11/16/20 1743 11/19/20 1148   11/16/20 1930  abacavir-dolutegravir-lamiVUDine (TRIUMEQ) 600-50-300 MG per tablet 1 tablet  Status:  Discontinued        1 tablet Oral Daily 11/16/20 1736 11/20/20 1430   11/16/20 1930  fluconazole (DIFLUCAN) tablet 400 mg  Status:  Discontinued        400 mg Oral Daily 11/16/20 1743 11/18/20 0712   11/16/20 1745  sulfamethoxazole-trimethoprim (BACTRIM) 200-40 MG/5ML suspension 20 mL  Status:  Discontinued        20 mL Oral Daily 11/16/20 1736 11/16/20 1743   11/16/20 1745  ceFEPIme (MAXIPIME) 2 g in sodium chloride 0.9 % 100 mL IVPB  Status:  Discontinued        2 g 200 mL/hr over 30 Minutes Intravenous  Once 11/16/20 1736 11/16/20 1743   11/16/20 1745  metroNIDAZOLE (FLAGYL) IVPB 500 mg  Status:  Discontinued        500 mg 100 mL/hr over 60 Minutes Intravenous Every 8  hours 11/16/20 1736 11/16/20 1842   11/16/20 1745  vancomycin (VANCOCIN) IVPB 1000 mg/200 mL premix  Status:  Discontinued        1,000 mg 200 mL/hr over 60 Minutes Intravenous  Once 11/16/20 1736 11/16/20 1743       I have personally reviewed the following labs and images: CBC: Recent Labs  Lab 11/19/20 0458 11/20/20  0113 11/21/20 0037 11/22/20 0049 11/24/20 0420  WBC 7.2 6.8 8.0 6.7 7.5  NEUTROABS  --   --  7.5  --   --   HGB 8.0* 7.8* 8.2* 8.8* 7.2*  HCT 25.1* 25.4* 26.7* 30.2* 22.8*  MCV 99.2 100.0 100.4* 102.4* 101.3*  PLT PLATELET CLUMPS NOTED ON SMEAR, UNABLE TO ESTIMATE 69* 73* 78* 136*   BMP &GFR Recent Labs  Lab 11/19/20 0458 11/20/20 0113 11/21/20 0037 11/22/20 0049 11/23/20 0213 11/24/20 0420  NA 133* 133* 135 138 138 138  K 5.4* 5.5* 5.0 5.4* 5.6* 5.5*  CL 102 102 103 103 106 105  CO2 23 26 24 27 26 24   GLUCOSE 96 106* 124* 154* 121* 122*  BUN 27* 26* 22* 20 23* 25*  CREATININE 1.20* 1.22* 1.14* 1.16* 0.90 0.99  CALCIUM 8.2* 8.2* 8.1* 8.6* 8.6* 8.6*  MG 1.8 1.9 1.7 1.8  --  1.8  PHOS  --   --  2.9  --   --  2.6   Estimated Creatinine Clearance: 36.9 mL/min (by C-G formula based on SCr of 0.99 mg/dL). Liver & Pancreas: Recent Labs  Lab 11/21/20 0037 11/24/20 0420  ALBUMIN 1.5* 1.5*   No results for input(s): LIPASE, AMYLASE in the last 168 hours. No results for input(s): AMMONIA in the last 168 hours. Diabetic: No results for input(s): HGBA1C in the last 72 hours. Recent Labs  Lab 11/23/20 0736 11/23/20 1151 11/23/20 1642 11/23/20 2057 11/24/20 0748  GLUCAP 124* 131* 134* 171* 164*   Cardiac Enzymes: No results for input(s): CKTOTAL, CKMB, CKMBINDEX, TROPONINI in the last 168 hours. No results for input(s): PROBNP in the last 8760 hours. Coagulation Profile: No results for input(s): INR, PROTIME in the last 168 hours. Thyroid Function Tests: No results for input(s): TSH, T4TOTAL, FREET4, T3FREE, THYROIDAB in the last 72 hours. Lipid  Profile: No results for input(s): CHOL, HDL, LDLCALC, TRIG, CHOLHDL, LDLDIRECT in the last 72 hours. Anemia Panel: No results for input(s): VITAMINB12, FOLATE, FERRITIN, TIBC, IRON, RETICCTPCT in the last 72 hours. Urine analysis:    Component Value Date/Time   COLORURINE YELLOW 08/26/2018 0020   APPEARANCEUR HAZY (A) 08/26/2018 0020   LABSPEC 1.017 08/26/2018 0020   PHURINE 6.0 08/26/2018 0020   GLUCOSEU NEGATIVE 08/26/2018 0020   HGBUR NEGATIVE 08/26/2018 0020   BILIRUBINUR NEGATIVE 08/26/2018 0020   KETONESUR NEGATIVE 08/26/2018 0020   PROTEINUR NEGATIVE 08/26/2018 0020   UROBILINOGEN 0.2 06/25/2014 2152   NITRITE POSITIVE (A) 08/26/2018 0020   LEUKOCYTESUR TRACE (A) 08/26/2018 0020   Sepsis Labs: Invalid input(s): PROCALCITONIN, LACTICIDVEN  Microbiology: Recent Results (from the past 240 hour(s))  SARS Coronavirus 2 by RT PCR (hospital order, performed in [facility] hospital lab) Nasopharyngeal Nasopharyngeal Swab     Status: None   Collection Time: 11/16/20 10:06 AM   Specimen: Nasopharyngeal Swab  Result Value Ref Range Status   SARS Coronavirus 2 NEGATIVE NEGATIVE Final    Comment: (NOTE) SARS-CoV-2 target nucleic acids are NOT DETECTED.  The SARS-CoV-2 RNA is generally detectable in upper and lower respiratory specimens during the acute phase of infection. The lowest concentration of SARS-CoV-2 viral copies this assay can detect is 250 copies / mL. A negative result does not preclude SARS-CoV-2 infection and should not be used as the sole basis for treatment or other patient management decisions.  A negative result may occur with improper specimen collection / handling, submission of specimen other than nasopharyngeal swab, presence of viral mutation(s) within the areas targeted  by this assay, and inadequate number of viral copies (<250 copies / mL). A negative result must be combined with clinical observations, patient history, and epidemiological  information.  Fact Sheet for Patients:   BoilerBrush.com.cy  Fact Sheet for Healthcare Providers: https://pope.com/  This test is not yet approved or  cleared by the [place] FDA and has been authorized for detection and/or diagnosis of SARS-CoV-2 by FDA under an Emergency Use Authorization (EUA).  This EUA will remain in effect (meaning this test can be used) for the duration of the COVID-19 declaration under Section 564(b)(1) of the Act, 21 U.S.C. section 360bbb-3(b)(1), unless the authorization is terminated or revoked sooner.  Performed at [facility], [address], [place], [place] [address]   Culture, blood (Routine X 2) w Reflex to ID Panel     Status: None   Collection Time: 11/17/20 11:11 AM   Specimen: BLOOD RIGHT ARM  Result Value Ref Range Status   Specimen Description BLOOD RIGHT ARM  Final   Special Requests   Final    BOTTLES DRAWN AEROBIC ONLY Blood Culture results may not be optimal due to an inadequate volume of blood received in culture bottles   Culture   Final    NO GROWTH 5 DAYS Performed at [facility], [address] [place], [place] [address]    Report Status 11/22/2020 FINAL  Final  Culture, blood (Routine X 2) w Reflex to ID Panel     Status: None   Collection Time: 11/17/20 11:11 AM   Specimen: BLOOD LEFT HAND  Result Value Ref Range Status   Specimen Description BLOOD LEFT HAND  Final   Special Requests   Final    BOTTLES DRAWN AEROBIC AND ANAEROBIC Blood Culture results may not be optimal due to an inadequate volume of blood received in culture bottles   Culture   Final    NO GROWTH 5 DAYS Performed at [facility], [address], [place], [place] [address]    Report Status 11/22/2020 FINAL  Final  Blastomyces Antigen     Status: None   Collection Time: 11/20/20  3:01 PM   Specimen: Blood  Result Value Ref Range Status   Blastomyces Antigen None Detected None  Detected ng/mL Final    Comment: (NOTE) Results reported as ng/mL in 0.2 - 14.7 ng/mL range Results above the limit of detection but below 0.2 ng/mL are reported as 'Positive, Below the Limit of Quantification' Results above 14.7 ng/mL are reported as 'Positive, Above the Limit of Quantification'    Specimen Type SERUM  Final    Comment: (NOTE) Performed At: [facility] [address] [place], [place] [id number] Jiles Garter Madelin Rear MD DG:[id number]     Radiology Studies: No results found.   Demiyah Fischbach T. Cybele Maule [facility]  If 7PM-7AM, please contact night-coverage [email or web address] 11/24/2020, 2:09 PM

## 2020-11-24 NOTE — Progress Notes (Signed)
HIV Genotype Composite Data Genotype Dates: Composite  Mutations in Bold impact drug susceptibility RT Mutations M184V  PI Mutations M46L  Integrase Mutations None   Interpretation of Genotype Data per [employer] HIV Database Nucleoside RTIs  abacavir (ABC) Low-Level Resistance zidovudine (AZT) Susceptible emtricitabine (FTC) High-Level Resistance lamivudine (3TC) High-Level Resistance tenofovir (TDF) Susceptible   Non-Nucleoside RTIs  doravirine (DOR) Susceptible efavirenz (EFV) Susceptible etravirine (ETR) Susceptible nevirapine (NVP) Susceptible rilpivirine (RPV) Susceptible   Protease Inhibitors  atazanavir/r (ATV/r) Potential Low-Level Resistance darunavir/r (DRV/r) Susceptible lopinavir/r (LPV/r) Potential Low-Level Resistance   Integrase Inhibitors      Pt has had a long hx of non-compliance to her HIV meds. Her regimen was recently changed from Triumeq to Prezcobix/dolutegravir. Based on her current resistance mutations, we will change it to Symtuza/dolutegravir. Ok with Dr. Chesley Mires, PharmD, BCIDP, AAHIVP, CPP Infectious Disease Pharmacist 11/24/2020 2:30 PM

## 2020-11-25 DIAGNOSIS — B20 Human immunodeficiency virus [HIV] disease: Secondary | ICD-10-CM | POA: Diagnosis not present

## 2020-11-25 DIAGNOSIS — R0902 Hypoxemia: Secondary | ICD-10-CM

## 2020-11-25 DIAGNOSIS — R0609 Other forms of dyspnea: Secondary | ICD-10-CM

## 2020-11-25 DIAGNOSIS — B59 Pneumocystosis: Secondary | ICD-10-CM

## 2020-11-25 DIAGNOSIS — B259 Cytomegaloviral disease, unspecified: Secondary | ICD-10-CM | POA: Diagnosis not present

## 2020-11-25 DIAGNOSIS — R6520 Severe sepsis without septic shock: Secondary | ICD-10-CM | POA: Diagnosis not present

## 2020-11-25 LAB — CBC
HCT: 26.3 % — ABNORMAL LOW (ref 36.0–46.0)
Hemoglobin: 7.8 g/dL — ABNORMAL LOW (ref 12.0–15.0)
MCH: 30.8 pg (ref 26.0–34.0)
MCHC: 29.7 g/dL — ABNORMAL LOW (ref 30.0–36.0)
MCV: 104 fL — ABNORMAL HIGH (ref 80.0–100.0)
Platelets: 204 10*3/uL (ref 150–400)
RBC: 2.53 MIL/uL — ABNORMAL LOW (ref 3.87–5.11)
RDW: 17.3 % — ABNORMAL HIGH (ref 11.5–15.5)
WBC: 11.2 10*3/uL — ABNORMAL HIGH (ref 4.0–10.5)
nRBC: 0.3 % — ABNORMAL HIGH (ref 0.0–0.2)

## 2020-11-25 LAB — RENAL FUNCTION PANEL
Albumin: 1.6 g/dL — ABNORMAL LOW (ref 3.5–5.0)
Anion gap: 11 (ref 5–15)
BUN: 30 mg/dL — ABNORMAL HIGH (ref 6–20)
CO2: 23 mmol/L (ref 22–32)
Calcium: 8.6 mg/dL — ABNORMAL LOW (ref 8.9–10.3)
Chloride: 104 mmol/L (ref 98–111)
Creatinine, Ser: 1.02 mg/dL — ABNORMAL HIGH (ref 0.44–1.00)
GFR, Estimated: >60 mL/min (ref >60)
Glucose, Bld: 171 mg/dL — ABNORMAL HIGH (ref 70–99)
Phosphorus: 1.5 mg/dL — ABNORMAL LOW (ref 2.5–4.6)
Potassium: 4.7 mmol/L (ref 3.5–5.1)
Sodium: 138 mmol/L (ref 135–145)

## 2020-11-25 LAB — GLUCOSE 6 PHOSPHATE DEHYDROGENASE
G6PDH: 11.3 U/g{Hb} (ref 4.7–14.6)
Hemoglobin: 7.2 g/dL — ABNORMAL LOW (ref 11.1–15.9)

## 2020-11-25 LAB — MAGNESIUM: Magnesium: 1.7 mg/dL (ref 1.7–2.4)

## 2020-11-25 MED ORDER — SODIUM PHOSPHATES 45 MMOLE/15ML IV SOLN
15.0000 mmol | Freq: Once | INTRAVENOUS | Status: DC
  Filled 2020-11-25: qty 5

## 2020-11-25 MED ORDER — LORAZEPAM 2 MG/ML IJ SOLN
1.0000 mg | INTRAMUSCULAR | Status: DC | PRN
  Administered 2020-11-26 – 2020-11-27 (×2): 1 mg via INTRAVENOUS
  Filled 2020-11-25 (×2): qty 1

## 2020-11-25 NOTE — TOC Progression Note (Signed)
Transition of Care [facility]) - Progression Note    Patient Details  Name: Krystal Bowen MRN: [id number] Date of Birth: 21-Mar-1971  [facility] CM/SW Contact  Lorri Frederick, LCSW Phone Number: 11/25/2020, 8:58 AM  Clinical Narrative:   CSW continues to monitor pt conversations with palliative care and to follow for discharge needs.    Expected Discharge Plan: Skilled Nursing Facility Barriers to Discharge: Continued Medical Work up,SNF Pending bed offer  Expected Discharge Plan and Services Expected Discharge Plan: Skilled Nursing Facility     Post Acute Care Choice: Skilled Nursing Facility Living arrangements for the past 2 months: Single Family Home                                       Social Determinants of Health (SDOH) Interventions    Readmission Risk Interventions No flowsheet data found.

## 2020-11-25 NOTE — Progress Notes (Signed)
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  She was admitted for acute respiratory failure with hypoxia and sepsis presumed to be due to PJP pneumonia, oral candidiasis and AKI.  She was restarted on ART, Bactrim and azithromycin per recommendation by ID.  Prednisone added due to increased oxygen requirement. Evaluated by psychiatry who recommended SSRI but no indication for inpatient psych.  Patient inconsistent on goal of care.  Per PMT, she seems to be interested in comfort care, although this didn't seem to be the case with the rest of medical team. Infectious disease, palliative medicine and pulmonology following.    Subjective: Seen and examined earlier this morning.  No major events over night of this morning.  No complaints.  He says his night was "so-so".  Breathing is "minimal".  Denies chest pain.  Eating breakfast.  Objective: Vitals:   11/25/20 0725 11/25/20 0740 11/25/20 0931 11/25/20 1000  BP: (!) 129/101  (!) 129/98   Pulse: 97 94 (!) 114 (!) 101  Resp: 20 20    Temp: 98.3 F (36.8 C)     TempSrc: Oral     SpO2: 97% 98%    Weight:      Height:       No intake or output data in the 24 hours ending 11/25/20 1211 Filed Weights   11/16/20 1900  Weight: 34 kg    Examination:  GENERAL: Frail and chronically ill-appearing. HEENT: MMM.  Vision and hearing grossly intact.  NECK: Supple.  No apparent JVD.  RESP: 97% on 25 L / 100%.  No IWOB.  Fair aeration bilaterally. CVS:  RRR. Heart sounds normal.  ABD/GI/GU: BS+. Abd soft, NTND.  MSK/EXT:  Moves extremities.  Significant muscle mass and subcu fat  loss. SKIN: no apparent skin lesion or wound NEURO: Awake, alert and oriented appropriately.  No apparent focal neuro deficit. PSYCH: Calm. Normal affect.   Procedures:  None  Microbiology summarized: COVID-19 PCR nonreactive. Blood cultures NGTD. Serum cryptococcal antigen negative.  Assessment & Plan: Acute respiratory failure with hypoxia due to PJP pneumonia-currently 97 on 25 L /70% % FiO2. Sepsis likely due to the above -PCCM following - continue Solumedrol and duonebs   -ID started ceftriaxone on 2/1 -Follow sputum PJP smear -Patient is DNR/DNI. -Family meeting to discuss goals of care.  HIV/AIDS-CD4 count<35.  VL 67,600 on 10/28/2019.  Noncompliant with meds. RPR and serum cryptococcal antigen negative. CMV retinitis/oral candidiasis -ID changed ART from 3 meg to Prezcobix and Tivicay based on genotype testing and adherence. -Continue Bactrim and Diflucan for PPX  Hypoerkalemia: Resolved. -Monitor.  Hypophosphatemia: P1.5. -Replenish with sodium phosphate  Essential hypertension/sinus tachycardia: DBP slightly elevated. -Continue metoprolol 25 mg twice daily -Stop IV fluid  Depression/anxiety: No SI or HI. -Appreciate psych recommendation-citalopram 10 mg daily which has been changed to Remeron since then. -No indication for inpatient psych  Microcytic anemia/thrombocytopenia: Slight drop in hemoglobin likely dilutional. Recent Labs    11/16/20 1158 11/17/20 0532 11/18/20 0439 11/19/20 0458 11/20/20 0113 11/21/20 0037 11/22/20 0049 11/23/20 1046 11/24/20 0420 11/25/20 0213  HGB 11.9* 8.5* 8.5* 8.0* 7.8* 8.2* 8.8* 7.7* 7.2* 7.8*  -Monitor H&H  Mechanical fall -PT/OT  Thrombocytopenia: Resolved. -Improved.  Goal of care: She is DNR/DNI appropriately. Otherwise, she wished to continue current treatment for HIV and respiratory failure. She says she is not ready for comfort measures only, although this doesn't seems to be her conversation with  palliative medicine. She also identified her mother and father as Social research officer, government when she is not able to. I talked to her mother who is planning to visit her tomorrow to discuss and clarify her wishes.  -Palliative medicine following.   Severe malnutrition due to acute on chronic illness Wt Readings from Last 10 Encounters:  11/16/20 34 kg  11/02/20 34 kg  10/27/20 36.2 kg  08/10/20 39.9 kg  06/08/20 40.8 kg  11/13/19 45.6 kg  10/02/19 49 kg  05/15/19 49.4 kg  02/18/19 48.5 kg  01/21/19 48.5 kg   Body mass index is 14.17 kg/m. Nutrition Problem: Severe Malnutrition Etiology: chronic illness (HIV/AIDS) Signs/Symptoms: severe fat depletion,severe muscle depletion Interventions: Ensure Enlive (each supplement provides 350kcal and 20 grams of protein),Magic cup,MVI   DVT prophylaxis:  Place and maintain sequential compression device Start: 11/23/20 1813  Code Status: DNR/DNI Family Communication: Updated patient's mother over the phone on 2/2 Level of care: Progressive Status is: Inpatient  Remains inpatient appropriate because:Hemodynamically unstable, Persistent severe electrolyte disturbances, Ongoing diagnostic testing needed not appropriate for outpatient work up, Unsafe d/c plan, IV treatments appropriate due to intensity of illness or inability to take PO and Inpatient level of care appropriate due to severity of illness   Dispo:  Patient From: Home  Planned Disposition: Home  Expected discharge date: 11/28/2020  Medically stable for discharge: No         Consultants:  Infectious disease Psychiatry PCCM Palliative medicine   Sch Meds:  Scheduled Meds: . amLODipine  5 mg Oral Daily  . Darunavir-Cobicisctat-Emtricitabine-Tenofovir Alafenamide  1 tablet Oral Q breakfast  . dolutegravir  50 mg Oral Daily  . feeding supplement  237 mL Oral TID BM  . fluconazole  200 mg Oral Daily  . methylPREDNISolone (SOLU-MEDROL) injection  40 mg Intravenous Q12H   . metoprolol tartrate  25 mg Oral BID  . mirtazapine  7.5 mg Oral QHS  . sodium chloride flush  3 mL Intravenous Q12H  . valGANciclovir  450 mg Oral Daily   Continuous Infusions: . cefTRIAXone (ROCEPHIN)  IV 2 g (11/25/20 0951)  . sodium phosphate  Dextrose 5% IVPB    . sulfamethoxazole-trimethoprim 320 mg (11/25/20 1034)   PRN Meds:.acetaminophen **OR** acetaminophen, dextromethorphan-guaiFENesin, hydrALAZINE, ipratropium-albuterol, lactulose, LORazepam, morphine CONCENTRATE **OR** morphine injection, ondansetron **OR** ondansetron (ZOFRAN) IV, polyethylene glycol, senna-docusate, sodium chloride  Antimicrobials: Anti-infectives (From admission, onward)   Start     Dose/Rate Route Frequency Ordered Stop   11/25/20 0700  Darunavir-Cobicisctat-Emtricitabine-Tenofovir Alafenamide (SYMTUZA) 800-150-200-10 MG TABS 1 tablet        1 tablet Oral Daily with breakfast 11/24/20 1426     11/24/20 2200  sulfamethoxazole-trimethoprim (BACTRIM) 160 mg in dextrose 5 % 250 mL IVPB  Status:  Discontinued        160 mg 260 mL/hr over 60 Minutes Intravenous Every 12 hours 11/24/20 0935 11/24/20 1143   11/24/20 2200  sulfamethoxazole-trimethoprim (BACTRIM) 320 mg in dextrose 5 % 500 mL IVPB        320 mg 346.7 mL/hr over 90 Minutes Intravenous Every 12 hours 11/24/20 1143     11/23/20 1215  cefTRIAXone (ROCEPHIN) 2 g in sodium chloride 0.9 % 100 mL IVPB  2 g 200 mL/hr over 30 Minutes Intravenous Every 24 hours 11/23/20 1116 11/30/20 0959   11/23/20 1000  valGANciclovir (VALCYTE) 450 MG tablet TABS 450 mg        450 mg Oral Daily 11/23/20 0936     11/21/20 0700  darunavir-cobicistat (PREZCOBIX) 800-150 MG per tablet 1 tablet  Status:  Discontinued        1 tablet Oral Daily with breakfast 11/20/20 1432 11/24/20 1426   11/20/20 2200  valGANciclovir (VALCYTE) 450 MG tablet TABS 450 mg  Status:  Discontinued        450 mg Oral Every 48 hours 11/20/20 1430 11/23/20 0936   11/20/20 1700  azithromycin  (ZITHROMAX) tablet 1,200 mg  Status:  Discontinued        1,200 mg Oral Every Sat 11/20/20 1418 11/24/20 0907   11/20/20 1530  dolutegravir (TIVICAY) tablet 50 mg        50 mg Oral Daily 11/20/20 1432     11/20/20 1430  azithromycin (ZITHROMAX) tablet 1,200 mg  Status:  Discontinued        1,200 mg Oral Every Sat 11/20/20 1416 11/20/20 1418   11/19/20 2200  sulfamethoxazole-trimethoprim (BACTRIM DS) 800-160 MG per tablet 1 tablet  Status:  Discontinued        1 tablet Oral Every 12 hours 11/19/20 1148 11/24/20 0935   11/18/20 1000  fluconazole (DIFLUCAN) tablet 200 mg        200 mg Oral Daily 11/18/20 0712     11/18/20 0900  valGANciclovir (VALCYTE) 450 MG tablet TABS 450 mg  Status:  Discontinued        450 mg Oral Once per day on Mon Thu 11/16/20 2114 11/20/20 1430   11/16/20 2200  valGANciclovir (VALCYTE) 450 MG tablet TABS 450 mg  Status:  Discontinued        450 mg Oral 2 times daily 11/16/20 1736 11/16/20 2114   11/16/20 2000  sulfamethoxazole-trimethoprim (BACTRIM) 170.08 mg in dextrose 5 % 250 mL IVPB  Status:  Discontinued        5 mg/kg  34 kg 260.6 mL/hr over 60 Minutes Intravenous Every 12 hours 11/16/20 1743 11/19/20 1148   11/16/20 1930  abacavir-dolutegravir-lamiVUDine (TRIUMEQ) 600-50-300 MG per tablet 1 tablet  Status:  Discontinued        1 tablet Oral Daily 11/16/20 1736 11/20/20 1430   11/16/20 1930  fluconazole (DIFLUCAN) tablet 400 mg  Status:  Discontinued        400 mg Oral Daily 11/16/20 1743 11/18/20 0712   11/16/20 1745  sulfamethoxazole-trimethoprim (BACTRIM) 200-40 MG/5ML suspension 20 mL  Status:  Discontinued        20 mL Oral Daily 11/16/20 1736 11/16/20 1743   11/16/20 1745  ceFEPIme (MAXIPIME) 2 g in sodium chloride 0.9 % 100 mL IVPB  Status:  Discontinued        2 g 200 mL/hr over 30 Minutes Intravenous  Once 11/16/20 1736 11/16/20 1743   11/16/20 1745  metroNIDAZOLE (FLAGYL) IVPB 500 mg  Status:  Discontinued        500 mg 100 mL/hr over 60 Minutes  Intravenous Every 8 hours 11/16/20 1736 11/16/20 1842   11/16/20 1745  vancomycin (VANCOCIN) IVPB 1000 mg/200 mL premix  Status:  Discontinued        1,000 mg 200 mL/hr over 60 Minutes Intravenous  Once 11/16/20 1736 11/16/20 1743       I have personally reviewed the following labs and images: CBC: Recent Labs  Lab 11/20/20 0113 11/21/20 0037 11/22/20 0049 11/23/20 1046 11/24/20 0420 11/25/20 0213  WBC 6.8 8.0 6.7  --  7.5 11.2*  NEUTROABS  --  7.5  --   --   --   --   HGB 7.8* 8.2* 8.8* 7.7* 7.2* 7.8*  HCT 25.4* 26.7* 30.2*  --  22.8* 26.3*  MCV 100.0 100.4* 102.4*  --  101.3* 104.0*  PLT 69* 73* 78*  --  136* 204   BMP &GFR Recent Labs  Lab 11/20/20 0113 11/21/20 0037 11/22/20 0049 11/23/20 0213 11/24/20 0420 11/25/20 0213  NA 133* 135 138 138 138 138  K 5.5* 5.0 5.4* 5.6* 5.5* 4.7  CL 102 103 103 106 105 104  CO2 26 24 27 26 24 23   GLUCOSE 106* 124* 154* 121* 122* 171*  BUN 26* 22* 20 23* 25* 30*  CREATININE 1.22* 1.14* 1.16* 0.90 0.99 1.02*  CALCIUM 8.2* 8.1* 8.6* 8.6* 8.6* 8.6*  MG 1.9 1.7 1.8  --  1.8 1.7  PHOS  --  2.9  --   --  2.6 1.5*   Estimated Creatinine Clearance: 35.8 mL/min (A) (by C-G formula based on SCr of 1.02 mg/dL (H)). Liver & Pancreas: Recent Labs  Lab 11/21/20 0037 11/24/20 0420 11/25/20 0213  ALBUMIN 1.5* 1.5* 1.6*   No results for input(s): LIPASE, AMYLASE in the last 168 hours. No results for input(s): AMMONIA in the last 168 hours. Diabetic: No results for input(s): HGBA1C in the last 72 hours. Recent Labs  Lab 11/23/20 0736 11/23/20 1151 11/23/20 1642 11/23/20 2057 11/24/20 0748  GLUCAP 124* 131* 134* 171* 164*   Cardiac Enzymes: No results for input(s): CKTOTAL, CKMB, CKMBINDEX, TROPONINI in the last 168 hours. No results for input(s): PROBNP in the last 8760 hours. Coagulation Profile: No results for input(s): INR, PROTIME in the last 168 hours. Thyroid Function Tests: No results for input(s): TSH, T4TOTAL,  FREET4, T3FREE, THYROIDAB in the last 72 hours. Lipid Profile: No results for input(s): CHOL, HDL, LDLCALC, TRIG, CHOLHDL, LDLDIRECT in the last 72 hours. Anemia Panel: No results for input(s): VITAMINB12, FOLATE, FERRITIN, TIBC, IRON, RETICCTPCT in the last 72 hours. Urine analysis:    Component Value Date/Time   COLORURINE YELLOW 08/26/2018 0020   APPEARANCEUR HAZY (A) 08/26/2018 0020   LABSPEC 1.017 08/26/2018 0020   PHURINE 6.0 08/26/2018 0020   GLUCOSEU NEGATIVE 08/26/2018 0020   HGBUR NEGATIVE 08/26/2018 0020   BILIRUBINUR NEGATIVE 08/26/2018 0020   KETONESUR NEGATIVE 08/26/2018 0020   PROTEINUR NEGATIVE 08/26/2018 0020   UROBILINOGEN 0.2 06/25/2014 2152   NITRITE POSITIVE (A) 08/26/2018 0020   LEUKOCYTESUR TRACE (A) 08/26/2018 0020   Sepsis Labs: Invalid input(s): PROCALCITONIN, LACTICIDVEN  Microbiology: Recent Results (from the past 240 hour(s))  SARS Coronavirus 2 by RT PCR (hospital order, performed in [facility] hospital lab) Nasopharyngeal Nasopharyngeal Swab     Status: None   Collection Time: 11/16/20 10:06 AM   Specimen: Nasopharyngeal Swab  Result Value Ref Range Status   SARS Coronavirus 2 NEGATIVE NEGATIVE Final    Comment: (NOTE) SARS-CoV-2 target nucleic acids are NOT DETECTED.  The SARS-CoV-2 RNA is generally detectable in upper and lower respiratory specimens during the acute phase of infection. The lowest concentration of SARS-CoV-2 viral copies this assay can detect is 250 copies / mL. A negative result does not preclude SARS-CoV-2 infection and should not be used as the sole basis for treatment or other patient management decisions.  A negative result may occur with improper specimen  collection / handling, submission of specimen other than nasopharyngeal swab, presence of viral mutation(s) within the areas targeted by this assay, and inadequate number of viral copies (<250 copies / mL). A negative result must be combined with  clinical observations, patient history, and epidemiological information.  Fact Sheet for Patients:   BoilerBrush.com.cy  Fact Sheet for Healthcare Providers: https://pope.com/  This test is not yet approved or  cleared by the [place] FDA and has been authorized for detection and/or diagnosis of SARS-CoV-2 by FDA under an Emergency Use Authorization (EUA).  This EUA will remain in effect (meaning this test can be used) for the duration of the COVID-19 declaration under Section 564(b)(1) of the Act, 21 U.S.C. section 360bbb-3(b)(1), unless the authorization is terminated or revoked sooner.  Performed at [facility], [address], [place], [place] [address]   Culture, blood (Routine X 2) w Reflex to ID Panel     Status: None   Collection Time: 11/17/20 11:11 AM   Specimen: BLOOD RIGHT ARM  Result Value Ref Range Status   Specimen Description BLOOD RIGHT ARM  Final   Special Requests   Final    BOTTLES DRAWN AEROBIC ONLY Blood Culture results may not be optimal due to an inadequate volume of blood received in culture bottles   Culture   Final    NO GROWTH 5 DAYS Performed at [facility], [address], [place], [place] [address]    Report Status 11/22/2020 FINAL  Final  Culture, blood (Routine X 2) w Reflex to ID Panel     Status: None   Collection Time: 11/17/20 11:11 AM   Specimen: BLOOD LEFT HAND  Result Value Ref Range Status   Specimen Description BLOOD LEFT HAND  Final   Special Requests   Final    BOTTLES DRAWN AEROBIC AND ANAEROBIC Blood Culture results may not be optimal due to an inadequate volume of blood received in culture bottles   Culture   Final    NO GROWTH 5 DAYS Performed at [facility], [address], [place], [place] [address]    Report Status 11/22/2020 FINAL  Final  Blastomyces Antigen     Status: None   Collection Time: 11/20/20  3:01 PM   Specimen: Blood  Result Value Ref  Range Status   Blastomyces Antigen None Detected None Detected ng/mL Final    Comment: (NOTE) Results reported as ng/mL in 0.2 - 14.7 ng/mL range Results above the limit of detection but below 0.2 ng/mL are reported as 'Positive, Below the Limit of Quantification' Results above 14.7 ng/mL are reported as 'Positive, Above the Limit of Quantification'    Specimen Type SERUM  Final    Comment: (NOTE) Performed At: [facility] [address] [place], [place] [id number] Jiles Garter Madelin Rear MD [id number]     Radiology Studies: No results found.   Neysha Criado T. Norlene Lanes [facility]  If 7PM-7AM, please contact night-coverage [email or web address] 11/25/2020, 12:11 PM

## 2020-11-25 NOTE — Progress Notes (Addendum)
Palliative:  HPI:49 y.o.femalewith past medical history of HTN, HLD, retinal detachment with CMV retinitis, severe depression, HIV/AIDSadmitted on 1/25/2022with a fall. Found to have acute hypoxia from multifocal pneumonia - thought to be severe PJP pna.Also found to have oral candidiasis - on daily fluconazole for 3 weeks. Krystal Bowen also has a history of not taking her medications as prescribed. Krystal Bowen has had difficulty with desaturation 1/29 with oxygen requirements up to 25 L. Patient has also been bothered by anxiety and panic attacks. Seen by psych - citalopram daily recommended. PMT consulted to assist with symptom management.  Exam: Awake, appears fatigued, answers questions appropriately. Cachectic. Breathing slightly labored at rest with tachypnea. Anxiety appears controlled currently.  I met today with Krystal Bowen along with her daughters Krystal Bowen, and Krystal Bowen today at bedside. Krystal Bowen is awake and alert but continues to report feeling very fatigued. Krystal Bowen states that she has been taking her medications and they remain burdensome to her.  I spoke with Krystal Bowen and received permission to include her children in today's conversation. Her children have received reports from other family members but would like to hear directly from the care team. They report understanding the seriousness of Krystal Bowen's illness. Her children have noticed a decline in Krystal Bowen's health and weight loss since November 2021. They became aware of their mother's HIV diagnosis in August 2021.  Krystal Bowen was supported in her connection with her children. She was able to tell her children that she loves them and has enjoyed being their mother. Krystal Bowen restated in their presence that she wants to focus her care on comfort measures. Her children displayed appropriate grief and verbalized support of their mother's goals and decisions.   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  The difference between an aggressive medical intervention path and a palliative comfort path for this patient, at this time and in this situation was detailed.   Patient and family understand the limited prognosis.   Questions and concerns regarding the details and logistics of a comfort path for Krystal Bowen were detailed.  The natural trajectory and expectations at end of life was discussed. 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   I discussed via phone with Krystal Bowen's mother Krystal Bowen and sister Krystal Bowen. Family is aware of lifted visitation restrictions, they are encouraged to come to bedside, knowing anything can happen at anytime.       Total time spent on the unit  was 90 minutes Greater than 50%  of this time was spent counseling and coordinating care  related to the above assessment and plan    I discussed  Dr. Cyndia Skeeters and bedside RN/Krystal Bowen.  Krystal Lessen NP Palliative Medicine Team Pager [phone] (Please see [email or web address] for schedule) Team Phone [phone]

## 2020-11-25 NOTE — Progress Notes (Signed)
[facility] for Infectious Disease    Date of Admission:  11/16/2020   Total days of antibiotics 3   ID: Krystal Bowen is a 50 y.o. 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 amLODipine  5 mg Oral Daily  . Darunavir-Cobicisctat-Emtricitabine-Tenofovir Alafenamide  1 tablet Oral Q breakfast  . dolutegravir  50 mg Oral Daily  . feeding supplement  237 mL Oral TID BM  . fluconazole  200 mg Oral Daily  . methylPREDNISolone (SOLU-MEDROL) injection  40 mg Intravenous Q12H  . metoprolol tartrate  25 mg Oral BID  . mirtazapine  7.5 mg Oral QHS  . sodium chloride flush  3 mL Intravenous Q12H  . valGANciclovir  450 mg Oral Daily    Objective: Vital signs in last 24 hours: Temp:  [98.2 F (36.8 C)-98.9 F (37.2 C)] 98.3 F (36.8 C) (02/03 0725) Pulse Rate:  [88-114] 114 (02/03 0931) Resp:  [20-30] 20 (02/03 0740) BP: (129-146)/(93-101) 129/98 (02/03 0931) SpO2:  [97 %-100 %] 98 % (02/03 0740) FiO2 (%):  [80 %-100 %] 80 % (02/03 0740)  o2 11 liters [place]  Physical Exam  Constitutional:  No distress; conversant; chronically ill appearing/thin HEENT: conj clear; eomi; oropharynx is clear and moist Cardiovascular: rrr no mrg Lungs mild rales; normal respiratory effort Neck = supple, no nuchal  rigidity Abdominal: Soft/nt Neurological: nonfocal Skin: no rash   Lab Results Recent Labs    11/24/20 0420 11/25/20 0213  WBC 7.5 11.2*  HGB 7.2* 7.8*  HCT 22.8* 26.3*  NA 138 138  K 5.5* 4.7  CL 105 104  CO2 24 23  BUN 25* 30*  CREATININE 0.99 1.02*   Liver Panel Recent Labs    11/24/20 0420 11/25/20 0213  ALBUMIN 1.5* 1.6*   Sedimentation Rate No results for input(s): ESRSEDRATE in the last 72 hours. C-Reactive Protein Recent Labs    11/23/20 1127  CRP 2.2*    Serology: Lab Results  Component Value Date   HIV1RNAQUANT 67,600 (H) 10/27/2020   HIV1RNAQUANT 32,500 (H) 08/10/2020   HIV1RNAQUANT 279,000 (H) 06/08/2020   Lab Results  Component Value Date   CD4TABS <35 (L) 10/27/2020   CD4TABS <35 (L) 08/10/2020   CD4TABS <35 (L) 06/08/2020   Pending pjp sputum smear (unable to get sputum 1/29 blastomyces ag ip 1/29 urine histo ag pending 1/29 serum cryptococcal ag negative 1/05 rpr negative   Microbiology: 1/29 afb blood culture 1/26 bcx negative  Studies/Results: 1/25 cxr I reviewed Bilateral multifocal patchy opacity Widespread airspace opacity bilaterally, concerning for multifocal pneumonia, likely of atypical organism etiology. Differential considerations must include noncardiogenic pulmonary edema and widespread aspiration.   Heart size normal.   1/31 chest ct Patchy consolidation with air bronchograms throughout the bilateral lower lobes. Extensive patchy ground-glass opacity with scattered mild interlobular septal  thickening throughout the remaining lungs. Mildly thick walled and mildly irregular pulmonary cysts scattered in both lungs, predominantly in the upper lobes. Findings are compatible with multilobar opportunistic pneumonia, favoring pneumocystis pneumonia given the cystic changes in the lungs.    Assessment/Plan: 50 yo female with aids, poor compliance, cmv retinitis, admitted 1/25 after a GLF and right hip injury,  found to have hypoxemic respiratory failure (60% RA at presentation) concerned for pjp pna, also with oral candidiasis  2/03assessment No improvement from respiratory standpoint despite pjp tx/steroid empirically. O2 requirement high around 25-30 liters hfnc 100%fio2 Ongoing goals of care discussion   #hypoxemic resp failure #severe pjp pna Negative covid pcr, CrAg Presumed pjp; unable to get sputum/diagnostics; she is dnr/dni at this time and refused bronch  ddx is wide though to include other OI, kaposi, mac, endemic fungi, tb. She could have more than one process Added ceftriaxone 2/1 for increased fio2 -continue IV bactrim as patient is not taking PO well/refusing PO -procalcitonin is <0.1 yesterday; if continues current stability could remove ceftriaxone in a couple days -continue prednisone pjp course of 21 days; day 1 = 1/25 -would be helpful with a respiratory culture but she is unable to give  -f/u family meeting on goals of care today   #aids Hx noncompliance Current ART is triumeq vl in the past year suggest she is not taking triumeq consistently if at all 1/05 genotype without integrase strand inhibitor resistance; 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; PR mutation m46L, A71V, A82I, V77I  -ART changed to symtuza/tivicay for more potent protection against RAM development given her noncomplicance -DDI analysis along with other meds/art Had stopped amitryptiline, celexa and started remeron 7.5 mg qhs   #hx cmv retinitis She had tx in the past; would continue maintenance therapy due to high risk of relapse with current immune status She reports chronic stable left eye blurriness; she reports her last eye exam was 08/2020 -continue valgangciclovir 450 mg renal dosing q48hours until cd4 > 100 for at least 3 months, and no ophthalmologic concern and on stable art   #mac prophy #other OI #oral thrush #hx cmv retinitis  -continue fluconazole 400 mg po daily; plan 3 week course until  12/03/2020 -had stopped azithromycin to avoid burden of pills/gi side effect and focus on more urgent meds -continue valcyte secondary cmv retinitis prophy  #thrombocytopenia improved Previously had sent fungal serology/blood afb cx which are pending; serum crypto Ag negative -f/u urine histo ag, blasto ag, blood afb culture     [facility] for Infectious Diseases  11/25/2020, 10:01 AM

## 2020-11-26 MED ORDER — MORPHINE SULFATE (CONCENTRATE) 10 MG/0.5ML PO SOLN: 2.5000 mg | ORAL | Status: DC | PRN

## 2020-11-26 MED ORDER — MORPHINE SULFATE (PF) 2 MG/ML IV SOLN
1.0000 mg | INTRAVENOUS | Status: DC | PRN
  Administered 2020-11-26 – 2020-11-27 (×2): 2 mg via INTRAVENOUS
  Filled 2020-11-26: qty 2
  Filled 2020-11-26: qty 1

## 2020-11-26 NOTE — TOC Progression Note (Addendum)
Transition of Care [facility]) - Progression Note    Patient Details  Name: Krystal Bowen MRN: [id number] Date of Birth: [birth year]  Transition of Care [facility]) CM/SW Contact  Joanne Chars, LCSW Phone Number: 11/26/2020, 12:49 PM  Clinical Narrative: CSW had epic message conversation with Melanie Henslee/palliative and Chrislyn King/[facility].  Referral already made to [facility], who is reviewing pt O2 needs for coming to [facility].  [facility] has already been in contact with family and will move forward with this referral.     CSW met with Chrislyn King of [facility].  Possible [facility] bed tomorrow.  Questions about transport at 25L.  CSW called the following transport options: [facility]-can only transport up to 15L [facility]--said no [facility]--cannot transport that level of O2 [facility]--said they cannot transport in [employer].  1600: spoke with Dr Cyndia Skeeters.  He suggested pt could transport at 15L if using a non rebreather.  CSW spoke with [facility] and was told they can transport with non rebreather, so this may be option.    Expected Discharge Plan: [place] Barriers to Discharge: Continued Medical Work up,SNF Pending bed offer  Expected Discharge Plan and Services Expected Discharge Plan: [place] Choice: [place] arrangements for the past 2 months: Single Family Home                                       Social Determinants of Health (SDOH) Interventions    Readmission Risk Interventions No flowsheet data found.

## 2020-11-26 NOTE — Progress Notes (Signed)
Civil engineer, contracting [facility])  Received referral from [facility] for pt and family interest in [facility] for end of life care.  Chart under review by [facility] MD at this time.  Visited at bedside with pt and daughter Daphine Deutscher (sp?).  Pt AOx3, confirmed desire to transfer to [facility] when bed is available. [facility] is able to accept pt on up to 25L/min supplemental O2.   No bed at this time.  TOC Greg made aware.  [facility] liaisons will continue to follow and will update when bed is available.  Thank you for the opportunity to participate in this patient's care.  Gillian Scarce, BSN, RN [facility] Liaison [phone] (24h on call)

## 2020-11-26 NOTE — Progress Notes (Signed)
Spoke with family about not having a password. Pt is sleeping and unable to give permission to give out info.   Pt awake and asked about password. She stated that she didn't care if we gave out information over the phone. Pt is refusing most meds. She stated that she is okay and doesn't need anything for pain.   She seems to be in good spirits. Told pt to call out if anything needed. Pt wants to sleep.

## 2020-11-26 NOTE — Progress Notes (Signed)
Palliative Medicine RN Note: Rec'd request to reach out to [facility] about O2 capabilities there/what pt would have to wean down to, as well as getting her approved for a bed. 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  I messaged [facility] liaison Chrislyn & [place] with update.  Krystal Chance Damany Eastman, RN, BSN, [facility] Palliative Medicine Team 11/26/2020 9:47 AM Office [phone]

## 2020-11-26 NOTE — Progress Notes (Signed)
PROGRESS NOTE  Krystal Bowen RUE:[id number] DOB: [birth year]   PCP: Hoyt Koch, MD (Inactive)  Patient is from: Home  DOA: 11/16/2020 LOS: 10  Chief complaints: Fall at home  Brief Narrative / Interim history: 50 year old female with history of HIV/AIDS (CD4 <35 and VL 67,600) CMV retinitis with retinal detachment, HTN, HLD, depression and noncompliance with medication presented on 1/25 after mechanical fall.  She was admitted for acute respiratory failure with hypoxia and sepsis presumed to be due to PJP pneumonia, oral candidiasis and AKI.  She was restarted on ART, Bactrim and azithromycin per recommendation by ID.  Prednisone added due to increased oxygen requirement but patient continued to require high level of oxygen and continued to do poorly.  Palliative medicine met with patient and patient's family.  Eventually, patient was transitioned to full comfort care with a plan to transfer to [facility].   Subjective: Seen and examined earlier this morning.  No major events overnight of this morning.  Awake, alert and oriented x3.  No complaints but she informed RN that she wants "something stronger than morphine to help her sleep" later in the afternoon.  Objective: Vitals:   11/26/20 0102 11/26/20 0200 11/26/20 0400 11/26/20 1126  BP:    110/73  Pulse:    83  Resp:  (!) 24 (!) 28 19  Temp:    98.1 F (36.7 C)  TempSrc:      SpO2: 94%   100%  Weight:      Height:        Intake/Output Summary (Last 24 hours) at 11/26/2020 1607 Last data filed at 11/26/2020 0423 Gross per 24 hour  Intake -  Output 400 ml  Net -400 ml   Filed Weights   11/16/20 1900  Weight: 34 kg    Examination:  GENERAL: Frail and chronically ill-appearing. RESP: 100% on 20 L / 80% FiO2.  No IWOB.  CVS:  RRR. Heart sounds normal.  MSK/EXT: Significant muscle mass and subcu fat loss. NEURO: Awake, alert and oriented x3.  No apparent focal neuro deficit. PSYCH: Calm.  No distress or  agitation.  Procedures:  None  Microbiology summarized: COVID-19 PCR nonreactive. Blood cultures NGTD. Serum cryptococcal antigen negative.  Assessment & Plan: End-of-life care/DNR/DNI -Palliative pathway -Plan for transfer to [facility] if able to wean to 15L for [facility] transport  Acute respiratory failure with hypoxia due to PJP pneumonia-currently 100% on 20 L / 80% FiO2 -Continue weaning oxygen.    HIV/AIDS-CD4 count<35.  VL 67,600 on 10/28/2019.  Noncompliant with meds. CMV retinitis/oral candidiasis  Hypoerkalemia: Resolved.  Hypophosphatemia: P1.5.  Replenished  Essential hypertension/sinus tachycardia:   Depression/anxiety: No SI or HI. -Appreciate psych recommendation-citalopram 10 mg daily which has been changed to Remeron since then. -No indication for inpatient psych  Microcytic anemia/thrombocytopenia: Slight drop in hemoglobin likely dilutional.  Mechanical fall  Thrombocytopenia: Resolved.   Severe malnutrition due to acute on chronic illness Wt Readings from Last 10 Encounters:  11/16/20 34 kg  11/02/20 34 kg  10/27/20 36.2 kg  08/10/20 39.9 kg  06/08/20 40.8 kg  11/13/19 45.6 kg  10/02/19 49 kg  05/15/19 49.4 kg  02/18/19 48.5 kg  01/21/19 48.5 kg   Body mass index is 14.17 kg/m. Nutrition Problem: Severe Malnutrition Etiology: chronic illness (HIV/AIDS) Signs/Symptoms: severe fat depletion,severe muscle depletion Interventions: Ensure Enlive (each supplement provides 350kcal and 20 grams of protein),Magic cup,MVI   DVT prophylaxis:  Place and maintain sequential compression device Start: 11/23/20 1813  Code  Status: DNR/DNI Family Communication: Updated patient's mother over the phone  Level of care: Progressive Status is: Inpatient  Remains inpatient appropriate because:Unsafe d/c plan   Dispo:  Patient From: Home  Planned Disposition: [facility]  Expected discharge date: 11/27/2020  medically stable for discharge: No          Consultants:  Infectious disease-signed off Psychiatry-signed off PCCM-signed off Palliative medicine-following   Sch Meds:  Scheduled Meds: . amLODipine  5 mg Oral Daily  . Darunavir-Cobicisctat-Emtricitabine-Tenofovir Alafenamide  1 tablet Oral Q breakfast  . dolutegravir  50 mg Oral Daily  . feeding supplement  237 mL Oral TID BM  . fluconazole  200 mg Oral Daily  . metoprolol tartrate  25 mg Oral BID  . sodium chloride flush  3 mL Intravenous Q12H  . valGANciclovir  450 mg Oral Daily   Continuous Infusions:  PRN Meds:.acetaminophen **OR** acetaminophen, dextromethorphan-guaiFENesin, hydrALAZINE, ipratropium-albuterol, lactulose, LORazepam, morphine CONCENTRATE **OR** morphine injection, ondansetron **OR** ondansetron (ZOFRAN) IV, polyethylene glycol, senna-docusate, sodium chloride  Antimicrobials: Anti-infectives (From admission, onward)   Start     Dose/Rate Route Frequency Ordered Stop   11/25/20 0700  Darunavir-Cobicisctat-Emtricitabine-Tenofovir Alafenamide (SYMTUZA) 800-150-200-10 MG TABS 1 tablet        1 tablet Oral Daily with breakfast 11/24/20 1426     11/24/20 2200  sulfamethoxazole-trimethoprim (BACTRIM) 160 mg in dextrose 5 % 250 mL IVPB  Status:  Discontinued        160 mg 260 mL/hr over 60 Minutes Intravenous Every 12 hours 11/24/20 0935 11/24/20 1143   11/24/20 2200  sulfamethoxazole-trimethoprim (BACTRIM) 320 mg in dextrose 5 % 500 mL IVPB  Status:  Discontinued        320 mg 346.7 mL/hr over 90 Minutes Intravenous Every 12 hours 11/24/20 1143 11/25/20 1402   11/23/20 1215  cefTRIAXone (ROCEPHIN) 2 g in sodium chloride 0.9 % 100 mL IVPB  Status:  Discontinued        2 g 200 mL/hr over 30 Minutes Intravenous Every 24 hours 11/23/20 1116 11/25/20 1402   11/23/20 1000  valGANciclovir (VALCYTE) 450 MG tablet TABS 450 mg        450 mg Oral Daily 11/23/20 0936     11/21/20 0700  darunavir-cobicistat (PREZCOBIX) 800-150 MG per tablet 1 tablet  Status:   Discontinued        1 tablet Oral Daily with breakfast 11/20/20 1432 11/24/20 1426   11/20/20 2200  valGANciclovir (VALCYTE) 450 MG tablet TABS 450 mg  Status:  Discontinued        450 mg Oral Every 48 hours 11/20/20 1430 11/23/20 0936   11/20/20 1700  azithromycin (ZITHROMAX) tablet 1,200 mg  Status:  Discontinued        1,200 mg Oral Every Sat 11/20/20 1418 11/24/20 0907   11/20/20 1530  dolutegravir (TIVICAY) tablet 50 mg        50 mg Oral Daily 11/20/20 1432     11/20/20 1430  azithromycin (ZITHROMAX) tablet 1,200 mg  Status:  Discontinued        1,200 mg Oral Every Sat 11/20/20 1416 11/20/20 1418   11/19/20 2200  sulfamethoxazole-trimethoprim (BACTRIM DS) 800-160 MG per tablet 1 tablet  Status:  Discontinued        1 tablet Oral Every 12 hours 11/19/20 1148 11/24/20 0935   11/18/20 1000  fluconazole (DIFLUCAN) tablet 200 mg        200 mg Oral Daily 11/18/20 0712     11/18/20 0900  valGANciclovir (VALCYTE) 450 MG  tablet TABS 450 mg  Status:  Discontinued        450 mg Oral Once per day on Mon Thu 11/16/20 2114 11/20/20 1430   11/16/20 2200  valGANciclovir (VALCYTE) 450 MG tablet TABS 450 mg  Status:  Discontinued        450 mg Oral 2 times daily 11/16/20 1736 11/16/20 2114   11/16/20 2000  sulfamethoxazole-trimethoprim (BACTRIM) 170.08 mg in dextrose 5 % 250 mL IVPB  Status:  Discontinued        5 mg/kg  34 kg 260.6 mL/hr over 60 Minutes Intravenous Every 12 hours 11/16/20 1743 11/19/20 1148   11/16/20 1930  abacavir-dolutegravir-lamiVUDine (TRIUMEQ) 600-50-300 MG per tablet 1 tablet  Status:  Discontinued        1 tablet Oral Daily 11/16/20 1736 11/20/20 1430   11/16/20 1930  fluconazole (DIFLUCAN) tablet 400 mg  Status:  Discontinued        400 mg Oral Daily 11/16/20 1743 11/18/20 0712   11/16/20 1745  sulfamethoxazole-trimethoprim (BACTRIM) 200-40 MG/5ML suspension 20 mL  Status:  Discontinued        20 mL Oral Daily 11/16/20 1736 11/16/20 1743   11/16/20 1745  ceFEPIme (MAXIPIME)  2 g in sodium chloride 0.9 % 100 mL IVPB  Status:  Discontinued        2 g 200 mL/hr over 30 Minutes Intravenous  Once 11/16/20 1736 11/16/20 1743   11/16/20 1745  metroNIDAZOLE (FLAGYL) IVPB 500 mg  Status:  Discontinued        500 mg 100 mL/hr over 60 Minutes Intravenous Every 8 hours 11/16/20 1736 11/16/20 1842   11/16/20 1745  vancomycin (VANCOCIN) IVPB 1000 mg/200 mL premix  Status:  Discontinued        1,000 mg 200 mL/hr over 60 Minutes Intravenous  Once 11/16/20 1736 11/16/20 1743       I have personally reviewed the following labs and images: CBC: Recent Labs  Lab 11/20/20 0113 11/21/20 0037 11/22/20 0049 11/23/20 1046 11/24/20 0420 11/25/20 0213  WBC 6.8 8.0 6.7  --  7.5 11.2*  NEUTROABS  --  7.5  --   --   --   --   HGB 7.8* 8.2* 8.8* 7.7* 7.2*  7.2* 7.8*  HCT 25.4* 26.7* 30.2*  --  22.8* 26.3*  MCV 100.0 100.4* 102.4*  --  101.3* 104.0*  PLT 69* 73* 78*  --  136* 204   BMP &GFR Recent Labs  Lab 11/20/20 0113 11/21/20 0037 11/22/20 0049 11/23/20 0213 11/24/20 0420 11/25/20 0213  NA 133* 135 138 138 138 138  K 5.5* 5.0 5.4* 5.6* 5.5* 4.7  CL 102 103 103 106 105 104  CO2 26 24 27 26 24 23   GLUCOSE 106* 124* 154* 121* 122* 171*  BUN 26* 22* 20 23* 25* 30*  CREATININE 1.22* 1.14* 1.16* 0.90 0.99 1.02*  CALCIUM 8.2* 8.1* 8.6* 8.6* 8.6* 8.6*  MG 1.9 1.7 1.8  --  1.8 1.7  PHOS  --  2.9  --   --  2.6 1.5*   Estimated Creatinine Clearance: 35.8 mL/min (A) (by C-G formula based on SCr of 1.02 mg/dL (H)). Liver & Pancreas: Recent Labs  Lab 11/21/20 0037 11/24/20 0420 11/25/20 0213  ALBUMIN 1.5* 1.5* 1.6*   No results for input(s): LIPASE, AMYLASE in the last 168 hours. No results for input(s): AMMONIA in the last 168 hours. Diabetic: No results for input(s): HGBA1C in the last 72 hours. Recent Labs  Lab 11/23/20 [phone] 11/23/20  1151 11/23/20 1642 11/23/20 2057 11/24/20 0748  GLUCAP 124* 131* 134* 171* 164*   Cardiac Enzymes: No results for  input(s): CKTOTAL, CKMB, CKMBINDEX, TROPONINI in the last 168 hours. No results for input(s): PROBNP in the last 8760 hours. Coagulation Profile: No results for input(s): INR, PROTIME in the last 168 hours. Thyroid Function Tests: No results for input(s): TSH, T4TOTAL, FREET4, T3FREE, THYROIDAB in the last 72 hours. Lipid Profile: No results for input(s): CHOL, HDL, LDLCALC, TRIG, CHOLHDL, LDLDIRECT in the last 72 hours. Anemia Panel: No results for input(s): VITAMINB12, FOLATE, FERRITIN, TIBC, IRON, RETICCTPCT in the last 72 hours. Urine analysis:    Component Value Date/Time   COLORURINE YELLOW 08/26/2018 0020   APPEARANCEUR HAZY (A) 08/26/2018 0020   LABSPEC 1.017 08/26/2018 0020   PHURINE 6.0 08/26/2018 0020   GLUCOSEU NEGATIVE 08/26/2018 0020   HGBUR NEGATIVE 08/26/2018 0020   BILIRUBINUR NEGATIVE 08/26/2018 0020   KETONESUR NEGATIVE 08/26/2018 0020   PROTEINUR NEGATIVE 08/26/2018 0020   UROBILINOGEN 0.2 06/25/2014 2152   NITRITE POSITIVE (A) 08/26/2018 0020   LEUKOCYTESUR TRACE (A) 08/26/2018 0020   Sepsis Labs: Invalid input(s): PROCALCITONIN, LACTICIDVEN  Microbiology: Recent Results (from the past 240 hour(s))  Culture, blood (Routine X 2) w Reflex to ID Panel     Status: None   Collection Time: 11/17/20 11:11 AM   Specimen: BLOOD RIGHT ARM  Result Value Ref Range Status   Specimen Description BLOOD RIGHT ARM  Final   Special Requests   Final    BOTTLES DRAWN AEROBIC ONLY Blood Culture results may not be optimal due to an inadequate volume of blood received in culture bottles   Culture   Final    NO GROWTH 5 DAYS Performed at [facility], [address], [place], [place] [address]    Report Status 11/22/2020 FINAL  Final  Culture, blood (Routine X 2) w Reflex to ID Panel     Status: None   Collection Time: 11/17/20 11:11 AM   Specimen: BLOOD LEFT HAND  Result Value Ref Range Status   Specimen Description BLOOD LEFT HAND  Final   Special Requests   Final     BOTTLES DRAWN AEROBIC AND ANAEROBIC Blood Culture results may not be optimal due to an inadequate volume of blood received in culture bottles   Culture   Final    NO GROWTH 5 DAYS Performed at [facility], [address] [place], [place] [address]    Report Status 11/22/2020 FINAL  Final  Blastomyces Antigen     Status: None   Collection Time: 11/20/20  3:01 PM   Specimen: Blood  Result Value Ref Range Status   Blastomyces Antigen None Detected None Detected ng/mL Final    Comment: (NOTE) Results reported as ng/mL in 0.2 - 14.7 ng/mL range Results above the limit of detection but below 0.2 ng/mL are reported as 'Positive, Below the Limit of Quantification' Results above 14.7 ng/mL are reported as 'Positive, Above the Limit of Quantification'    Specimen Type SERUM  Final    Comment: (NOTE) Performed At: [facility] [address] [place], [place] [id number] Jiles Garter Madelin Rear MD UU:[id number]     Radiology Studies: No results found.   Jordynne Mccown T. Jazariah Teall [facility]  If 7PM-7AM, please contact night-coverage [email or web address] 11/26/2020, 4:07 PM

## 2020-11-26 NOTE — Progress Notes (Signed)
This chaplain responded to PMT consult for spiritual care.  The chaplain checked in with the Pt. RN-Tracy.  The chaplain understands the Pt. is sleeping at this time and spiritual care at another time is appreciated.  This chaplain is available for F/U spiritual care as needed.

## 2020-11-27 MED ORDER — MORPHINE SULFATE (CONCENTRATE) 10 MG /0.5 ML PO SOLN: 10.0000 mg | ORAL | 0 refills | Status: AC | PRN

## 2020-11-27 MED ORDER — GLYCOPYRROLATE 1 MG/5ML PO SOLN: 1.0000 mg | Freq: Four times a day (QID) | ORAL | 0 refills | Status: AC | PRN

## 2020-11-27 MED ORDER — LORAZEPAM 2 MG/ML IJ SOLN: 1.0000 mg | INTRAMUSCULAR | 0 refills | Status: AC | PRN

## 2020-11-27 NOTE — Discharge Summary (Signed)
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  She was admitted for acute respiratory failure with hypoxia and sepsis presumed to be due to PJP pneumonia, oral candidiasis and AKI.  She was restarted on ART, Bactrim and azithromycin per recommendation by ID.  Prednisone added due to increased oxygen requirement but patient continued to require high level of oxygen and continued to do poorly.  Palliative medicine met with patient and patient's family.  Eventually, patient was transitioned to full comfort care with a plan to transfer to [facility].   Discharge Diagnoses:  End-of-life care/DNR/DNI -Transfer to [facility] for end-of-life care  Acute respiratory failure with hypoxia due to PJP pneumonia- -wean to 15 L by NRB or [facility]NC for transfer  HIV/AIDS-CD4 count<35.  VL 67,600 on 10/28/2019.  Noncompliant with meds. CMV retinitis/oral candidiasis  Hypoerkalemia: Resolved.  Hypophosphatemia: P1.5.  Replenished  Essential hypertension/sinus tachycardia:   Depression/anxiety: No SI or HI.  Microcytic anemia/thrombocytopenia  Mechanical fall  Thrombocytopenia: Resolved.  Severe malnutrition Body mass index is 14.17 kg/m. Nutrition Problem: Severe Malnutrition Etiology: chronic illness (HIV/AIDS) Signs/Symptoms: severe fat depletion,severe muscle depletion Interventions: Ensure Enlive (each supplement provides 350kcal and 20 grams of protein),Magic cup,MVI       Discharge Exam: Vitals:   11/26/20 1126 11/27/20 0502  BP: 110/73 116/90  Pulse: 83 100  Resp: 19 18  Temp: 98.1 F (36.7 C) 98.2 F (36.8 C)  SpO2: 100% 96%   GENERAL: Frail and chronically ill-appearing. RESP: No IWOB. 97% on 15L CVS:  RRR. Heart sounds normal.  MSK/EXT: Significant muscle mass and subcu fat loss. NEURO: Awake, alert and oriented x3.  No apparent focal neuro deficit. PSYCH: Calm.  No distress or agitation.  Discharge Instructions   Allergies as of 11/27/2020   No Known Allergies     Medication List    STOP taking these medications   amitriptyline 25 MG tablet Commonly known as: ELAVIL   hydrochlorothiazide 25 MG tablet Commonly known as: HYDRODIURIL   sulfamethoxazole-trimethoprim 200-40 MG/5ML suspension Commonly known as: BACTRIM   Triumeq 600-50-300 MG tablet Generic drug: abacavir-dolutegravir-lamiVUDine   valGANciclovir 450 MG tablet Commonly known as: VALCYTE     TAKE these medications   Glycopyrrolate 1 MG/5ML Soln Take 5 mLs (1 mg total) by mouth 4 (four) times daily as needed for up to 3 days.   LORazepam 2 MG/ML injection Commonly known as: ATIVAN Inject 0.5 mLs (1 mg total) into the vein every 4 (four) hours as needed for anxiety.   morphine CONCENTRATE 10 mg / 0.5 ml concentrated solution Take 0.5 mLs (10 mg total) by mouth every 3 (three) hours as needed for moderate pain, severe pain or shortness of breath.       Consultations:  Infectious disease  Psychiatry  Palliative medicine  Pulmonology/intensivist  Procedures/Studies:   CT CHEST WO CONTRAST  Result Date: 11/22/2020 CLINICAL DATA:  Inpatient. HIV respiratory failure. Multifocal pneumonia. EXAM: CT CHEST WITHOUT CONTRAST TECHNIQUE: Multidetector CT imaging of the chest was performed following the standard protocol without IV contrast. COMPARISON:  11/16/2020 chest radiograph. FINDINGS:  Cardiovascular: Normal heart size. No significant pericardial  effusion/thickening. Great vessels are normal in course and caliber. Mediastinum/Nodes: No discrete thyroid nodules. Unremarkable esophagus. No pathologically enlarged axillary, mediastinal or hilar lymph nodes, noting limited sensitivity for the detection of hilar adenopathy on this noncontrast study. Lungs/Pleura: No pneumothorax. No pleural effusion. Patchy consolidation with air bronchograms throughout the bilateral lower lobes. Extensive patchy ground-glass opacity with scattered mild interlobular septal thickening throughout the remaining lungs. Mildly thick walled and mildly irregular pulmonary cysts scattered in both lungs predominantly in the upper lobes. No discrete lung masses or significant pulmonary nodules. Upper abdomen: No acute abnormality. Musculoskeletal:  No aggressive appearing focal osseous lesions. IMPRESSION: Patchy consolidation with air bronchograms throughout the bilateral lower lobes. Extensive patchy ground-glass opacity with scattered mild interlobular septal thickening throughout the remaining lungs. Mildly thick walled and mildly irregular pulmonary cysts scattered in both lungs, predominantly in the upper lobes. Findings are compatible with multilobar opportunistic pneumonia, favoring pneumocystis pneumonia given the cystic changes in the lungs. Electronically Signed   By: Ilona Sorrel M.D.   On: 11/22/2020 11:06   DG Pelvis Portable  Result Date: 11/16/2020 CLINICAL DATA:  Pain following fall EXAM: PORTABLE PELVIS 1-2 VIEWS COMPARISON:  None. FINDINGS: There is no evidence of pelvic fracture or dislocation. Joint spaces appear normal. No erosive change. IMPRESSION: No fracture or dislocation.  No evident arthropathy. Electronically Signed   By: Lowella Grip III M.D.   On: 11/16/2020 10:30   DG Chest Port 1 View  Result Date: 11/16/2020 CLINICAL DATA:  Shortness of breath.  Recent fall EXAM: PORTABLE CHEST 1 VIEW COMPARISON:  October 18, 2018 FINDINGS: There is  widespread airspace opacity throughout the lungs bilaterally without consolidation. Heart size and pulmonary vascularity are normal. No adenopathy. No pneumothorax. No bone lesions evident. IMPRESSION: Widespread airspace opacity bilaterally, concerning for multifocal pneumonia, likely of atypical organism etiology. Differential considerations must include noncardiogenic pulmonary edema and widespread aspiration. Heart size normal. Electronically Signed   By: Lowella Grip III M.D.   On: 11/16/2020 10:29        The results of significant diagnostics from this hospitalization (including imaging, microbiology, ancillary and laboratory) are listed below for reference.     Microbiology: Recent Results (from the past 240 hour(s))  Blastomyces Antigen     Status: None   Collection Time: 11/20/20  3:01 PM   Specimen: Blood  Result Value Ref Range Status   Blastomyces Antigen None Detected None Detected ng/mL Final    Comment: (NOTE) Results reported as ng/mL in 0.2 - 14.7 ng/mL range Results above the limit of detection but below 0.2 ng/mL are reported as 'Positive, Below the Limit of Quantification' Results above 14.7 ng/mL are reported as 'Positive, Above the Limit of Quantification'    Specimen Type SERUM  Final    Comment: (NOTE) Performed At: [facility] [place], [place] [id number] Noralyn Pick MD PQ:[id number]      Labs:  CBC: Recent Labs  Lab 11/21/20 0037 11/22/20 0049 11/23/20 1046 11/24/20 0420 11/25/20 0213  WBC 8.0 6.7  --  7.5 11.2*  NEUTROABS 7.5  --   --   --   --   HGB 8.2* 8.8* 7.7* 7.2*  7.2* 7.8*  HCT 26.7* 30.2*  --  22.8* 26.3*  MCV 100.4* 102.4*  --  101.3* 104.0*  PLT 73* 78*  --  136* 204   BMP &GFR Recent Labs  Lab 11/21/20 0037 11/22/20 0049 11/23/20 0213 11/24/20  8520 11/25/20 0213  NA 135 138 138 138 138  K 5.0 5.4* 5.6* 5.5* 4.7  CL 103 103 106 105 104  CO2 '24 27 26 24 23  ' GLUCOSE 124* 154*  121* 122* 171*  BUN 22* 20 23* 25* 30*  CREATININE 1.14* 1.16* 0.90 0.99 1.02*  CALCIUM 8.1* 8.6* 8.6* 8.6* 8.6*  MG 1.7 1.8  --  1.8 1.7  PHOS 2.9  --   --  2.6 1.5*   Estimated Creatinine Clearance: 35.8 mL/min (A) (by C-G formula based on SCr of 1.02 mg/dL (H)). Liver & Pancreas: Recent Labs  Lab 11/21/20 0037 11/24/20 0420 11/25/20 0213  ALBUMIN 1.5* 1.5* 1.6*   No results for input(s): LIPASE, AMYLASE in the last 168 hours. No results for input(s): AMMONIA in the last 168 hours. Diabetic: No results for input(s): HGBA1C in the last 72 hours. Recent Labs  Lab 11/23/20 0736 11/23/20 1151 11/23/20 1642 11/23/20 2057 11/24/20 0748  GLUCAP 124* 131* 134* 171* 164*   Cardiac Enzymes: No results for input(s): CKTOTAL, CKMB, CKMBINDEX, TROPONINI in the last 168 hours. No results for input(s): PROBNP in the last 8760 hours. Coagulation Profile: No results for input(s): INR, PROTIME in the last 168 hours. Thyroid Function Tests: No results for input(s): TSH, T4TOTAL, FREET4, T3FREE, THYROIDAB in the last 72 hours. Lipid Profile: No results for input(s): CHOL, HDL, LDLCALC, TRIG, CHOLHDL, LDLDIRECT in the last 72 hours. Anemia Panel: No results for input(s): VITAMINB12, FOLATE, FERRITIN, TIBC, IRON, RETICCTPCT in the last 72 hours. Urine analysis:    Component Value Date/Time   COLORURINE YELLOW 08/26/2018 0020   APPEARANCEUR HAZY (A) 08/26/2018 0020   LABSPEC 1.017 08/26/2018 0020   PHURINE 6.0 08/26/2018 0020   GLUCOSEU NEGATIVE 08/26/2018 0020   HGBUR NEGATIVE 08/26/2018 0020   BILIRUBINUR NEGATIVE 08/26/2018 0020   KETONESUR NEGATIVE 08/26/2018 0020   PROTEINUR NEGATIVE 08/26/2018 0020   UROBILINOGEN 0.2 06/25/2014 2152   NITRITE POSITIVE (A) 08/26/2018 0020   LEUKOCYTESUR TRACE (A) 08/26/2018 0020   Sepsis Labs: Invalid input(s): PROCALCITONIN, LACTICIDVEN   Time coordinating discharge: 35 minutes  SIGNED:  Mercy Riding, MD  [facility] 11/27/2020, 11:45 AM  If 7PM-7AM, please contact night-coverage [email or web address]

## 2020-11-27 NOTE — Progress Notes (Signed)
[facility] ([facility])  A bed at [facility] has been offered for today.  Krystal Bowen's oldest daughter, Krystal Bowen, has accepted on behalf of her mother.    Once consents have been signed TOC will be made aware to arrange transport.  At that time, report can be called to [phone].  Please be sure the [facility] DNR form transports with the patient.  Thank you for the opportunity to participate in this patient's care.  Gillian Scarce, BSN, RN [facility] hospital liaison [phone] (24h on call)

## 2020-11-27 NOTE — TOC Transition Note (Signed)
Transition of Care [facility]) - CM/SW Discharge Note   Patient Details  Name: Krystal Bowen MRN: [id number] Date of Birth: 10/22/1971  [facility]) CM/SW Contact:  Carley Hammed, LCSWA Phone Number: 11/27/2020, 12:45 PM   Clinical Narrative:    Nurse to call report to [phone]     Barriers to Discharge: Continued Medical Work up,SNF Pending bed offer   Patient Goals and CMS Choice   CMS Medicare.gov Compare Post Acute Care list provided to:: Patient Choice offered to / list presented to : Patient  Discharge Placement                       Discharge Plan and Services     Post Acute Care Choice: Skilled Nursing Facility                               Social Determinants of Health (SDOH) Interventions     Readmission Risk Interventions No flowsheet data found.

## 2020-12-21 DEATH — deceased

## 2021-01-06 LAB — MISC LABCORP TEST (SEND OUT)
LabCorp test name: 183764
Labcorp test code: 183764

## 2021-03-17 IMAGING — CT CT CHEST W/O CM
2 of 3 series · 15 of 36 positions shown, 18 images · non-contrast
Comparison: 11/16/2020 chest radiograph.

CLINICAL DATA: Inpatient. HIV respiratory failure. Multifocal
pneumonia.

EXAM:
CT CHEST WITHOUT CONTRAST
TECHNIQUE: Multidetector CT imaging of the chest was performed following the
standard protocol without IV contrast.

[Series 3: chest w/o 2mm st · axial · non-contrast · 0.59mm/px · z∈[+1082,+1308]mm · 12 of 133 slices shown, 15 images]
[im 10/133  mediastinal]
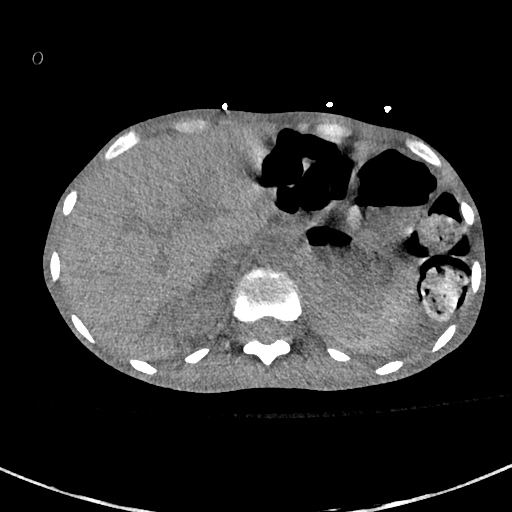
[im 10/133  lung]
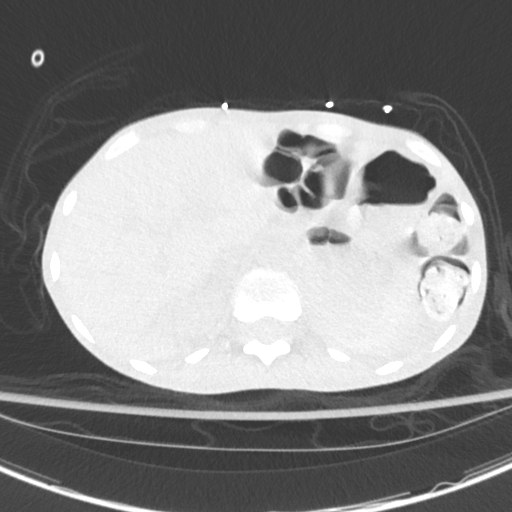
[im 20/133  lung]
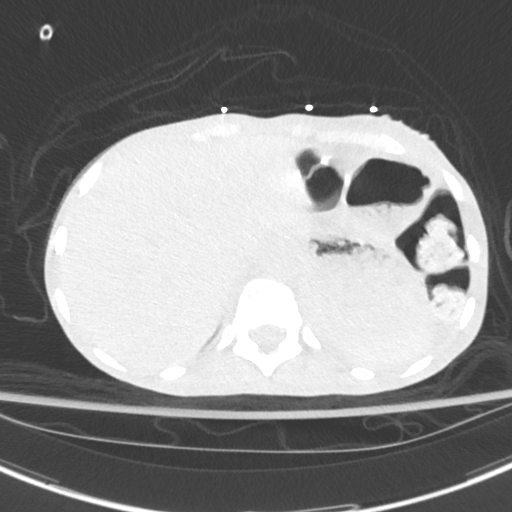
[im 30/133  lung]
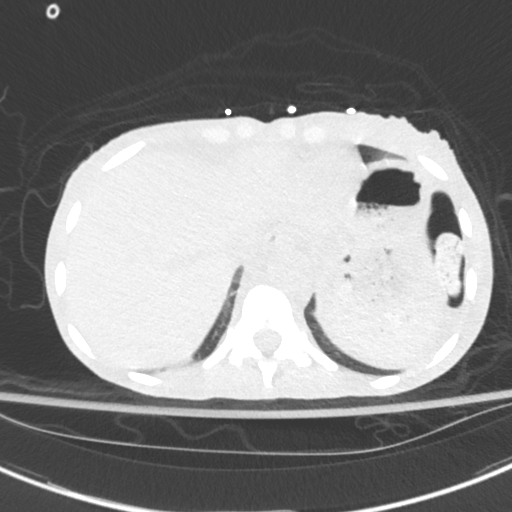
[im 40/133  lung]
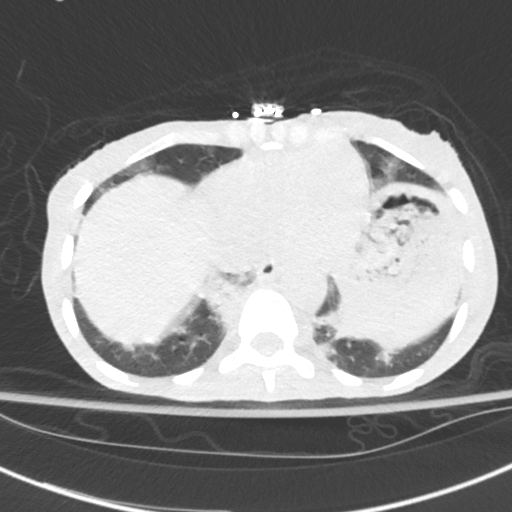
[im 49/133  mediastinal]
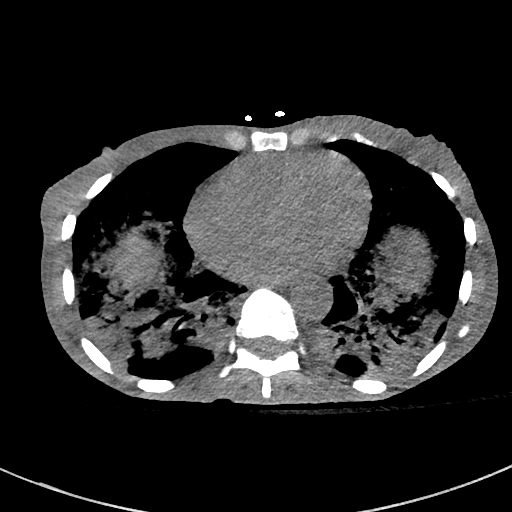
[im 49/133  lung]
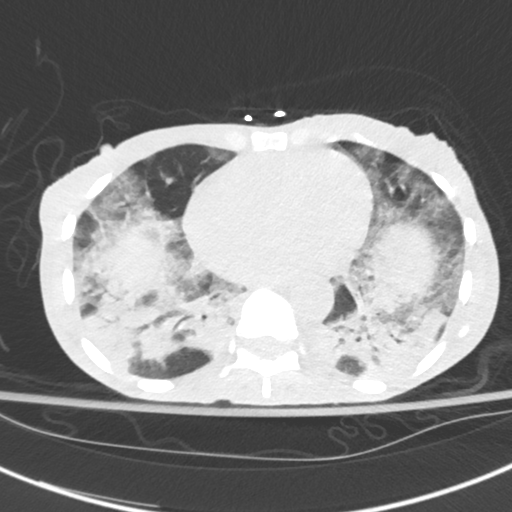
[im 59/133  lung]
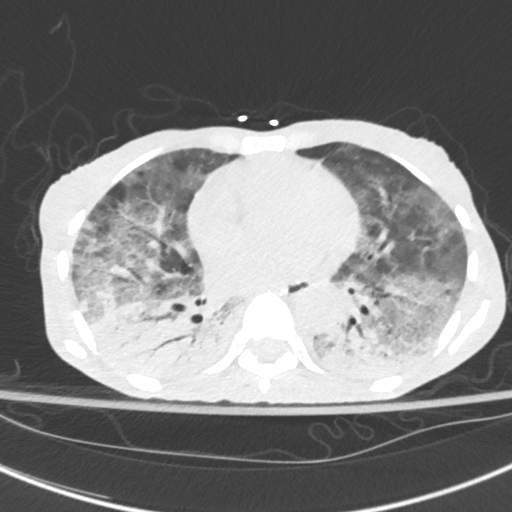
[im 74/133  lung]
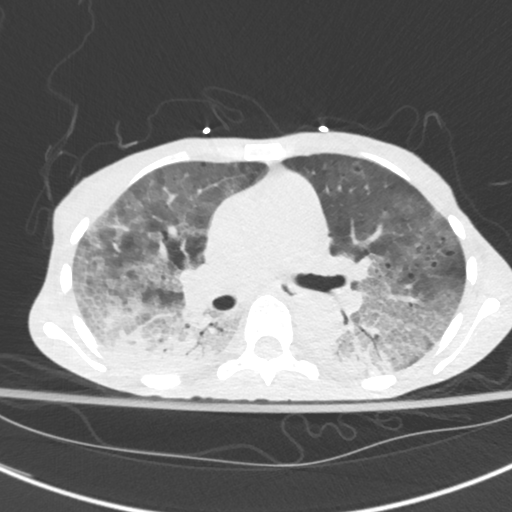
[im 84/133  lung]
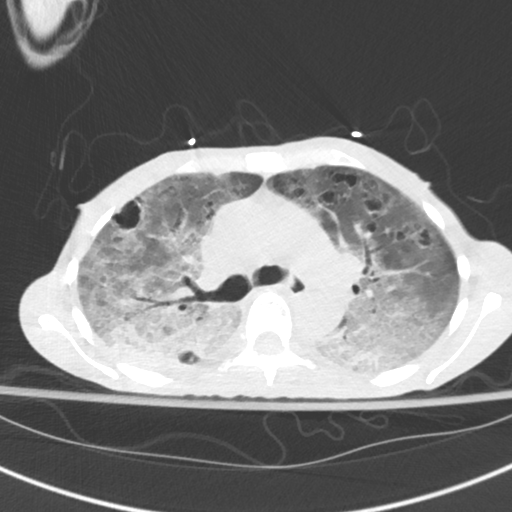
[im 93/133  mediastinal]
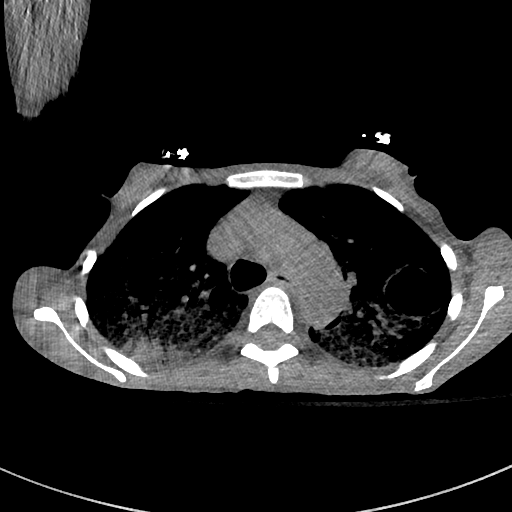
[im 93/133  lung]
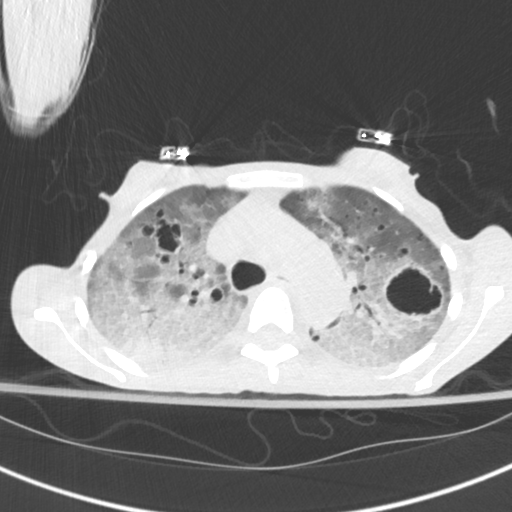
[im 103/133  lung]
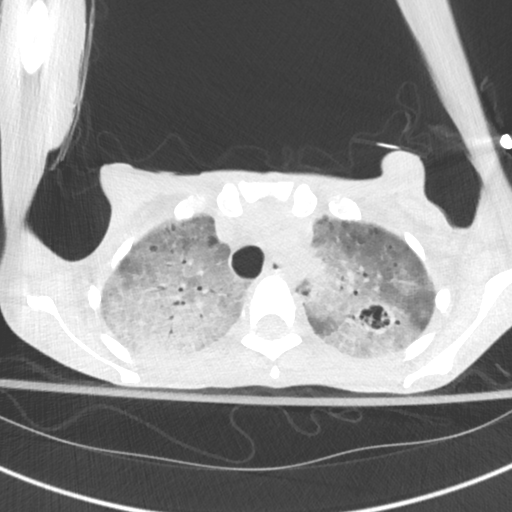
[im 113/133  lung]
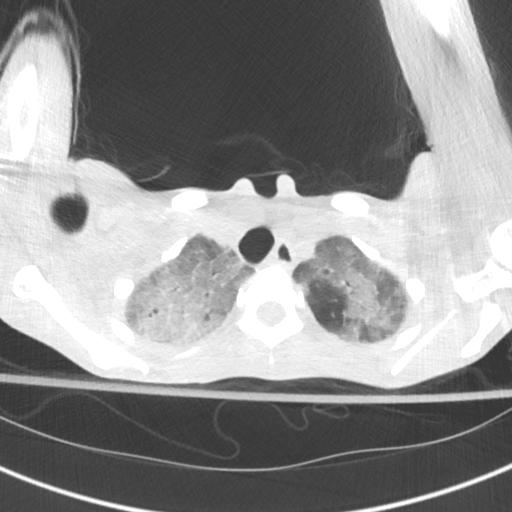
[im 123/133  lung]
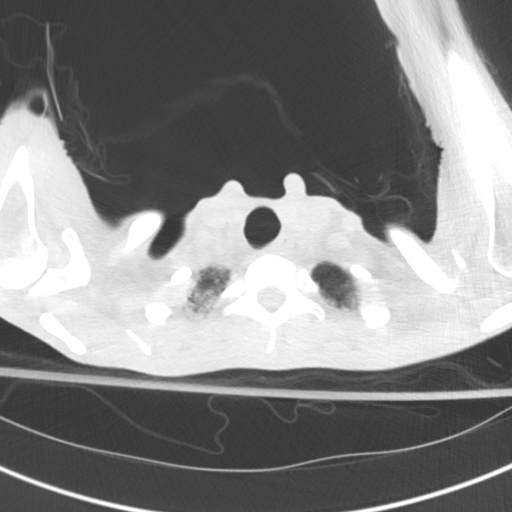

[Series 6: chest w/o 2mm st cor · coronal · non-contrast · 0.59mm/px · 3 of 118 slices shown]
[im 24/118  lung]
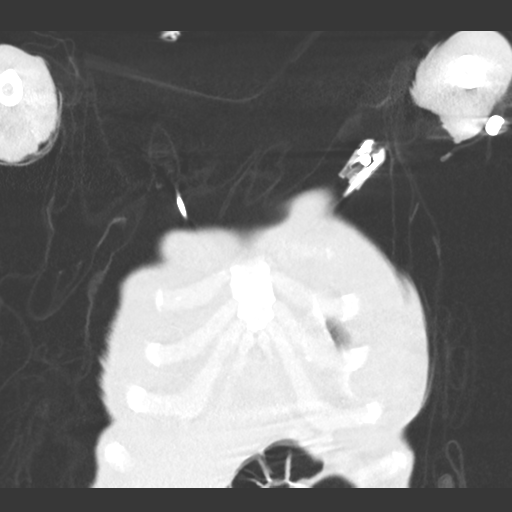
[im 47/118  lung]
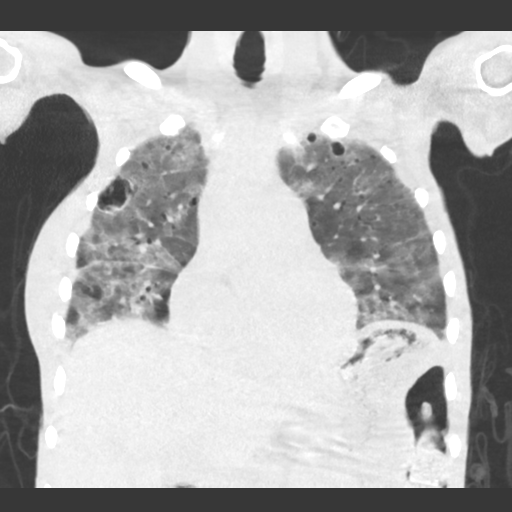
[im 71/118  lung]
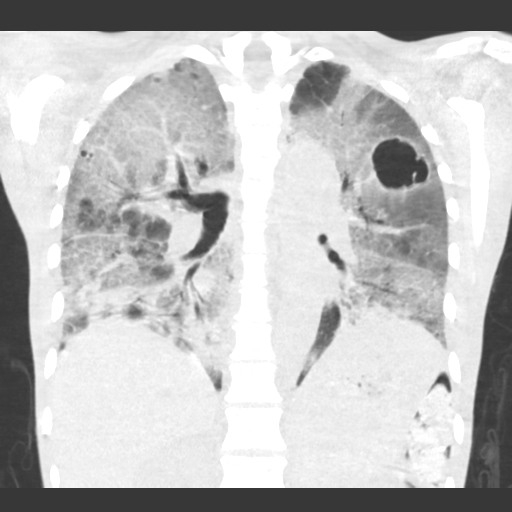

[15 of 36 positions shown; findings below may reference images not displayed]

FINDINGS: Cardiovascular: Normal heart size. No significant pericardial
effusion/thickening. Great vessels are normal in course and caliber.

Mediastinum/Nodes: No discrete thyroid nodules. Unremarkable
esophagus. No pathologically enlarged axillary, mediastinal or hilar
lymph nodes, noting limited sensitivity for the detection of hilar
adenopathy on this noncontrast study.

Lungs/Pleura: No pneumothorax. No pleural effusion. Patchy
consolidation with air bronchograms throughout the bilateral lower
lobes. Extensive patchy ground-glass opacity with scattered mild
interlobular septal thickening throughout the remaining lungs.
Mildly thick walled and mildly irregular pulmonary cysts scattered
in both lungs predominantly in the upper lobes. No discrete lung
masses or significant pulmonary nodules.

Upper abdomen: No acute abnormality.

Musculoskeletal:  No aggressive appearing focal osseous lesions.
IMPRESSION: Patchy consolidation with air bronchograms throughout the bilateral
lower lobes. Extensive patchy ground-glass opacity with scattered
mild interlobular septal thickening throughout the remaining lungs.
Mildly thick walled and mildly irregular pulmonary cysts scattered
in both lungs, predominantly in the upper lobes. Findings are
compatible with multilobar opportunistic pneumonia, favoring
pneumocystis pneumonia given the cystic changes in the lungs.
